# Patient Record
Sex: Male | Born: 1935 | Race: White | Hispanic: No | Marital: Married | Smoking: Former smoker
Health system: Southern US, Community
[De-identification: ages and names within clinical notes are randomized; demographics above are authoritative.]

## PROBLEM LIST (undated history)

## (undated) DIAGNOSIS — E109 Type 1 diabetes mellitus without complications: Secondary | ICD-10-CM

## (undated) DIAGNOSIS — J449 Chronic obstructive pulmonary disease, unspecified: Secondary | ICD-10-CM

## (undated) DIAGNOSIS — R079 Chest pain, unspecified: Secondary | ICD-10-CM

## (undated) DIAGNOSIS — N529 Male erectile dysfunction, unspecified: Secondary | ICD-10-CM

## (undated) DIAGNOSIS — E119 Type 2 diabetes mellitus without complications: Secondary | ICD-10-CM

## (undated) DIAGNOSIS — Z9981 Dependence on supplemental oxygen: Secondary | ICD-10-CM

## (undated) DIAGNOSIS — E7800 Pure hypercholesterolemia, unspecified: Secondary | ICD-10-CM

## (undated) DIAGNOSIS — C189 Malignant neoplasm of colon, unspecified: Secondary | ICD-10-CM

## (undated) DIAGNOSIS — I10 Essential (primary) hypertension: Secondary | ICD-10-CM

## (undated) DIAGNOSIS — I25810 Atherosclerosis of coronary artery bypass graft(s) without angina pectoris: Secondary | ICD-10-CM

## (undated) DIAGNOSIS — J189 Pneumonia, unspecified organism: Secondary | ICD-10-CM

## (undated) DIAGNOSIS — M1990 Unspecified osteoarthritis, unspecified site: Secondary | ICD-10-CM

## (undated) DIAGNOSIS — I209 Angina pectoris, unspecified: Secondary | ICD-10-CM

## (undated) DIAGNOSIS — Z9289 Personal history of other medical treatment: Secondary | ICD-10-CM

## (undated) DIAGNOSIS — R0602 Shortness of breath: Secondary | ICD-10-CM

## (undated) DIAGNOSIS — Z8719 Personal history of other diseases of the digestive system: Secondary | ICD-10-CM

## (undated) DIAGNOSIS — N184 Chronic kidney disease, stage 4 (severe): Secondary | ICD-10-CM

## (undated) HISTORY — DX: Type 1 diabetes mellitus without complications: E10.9

## (undated) HISTORY — DX: Male erectile dysfunction, unspecified: N52.9

---
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
## 1999-03-17 ENCOUNTER — Ambulatory Visit (HOSPITAL_COMMUNITY): Admission: RE | Admit: 1999-03-17 | Discharge: 1999-03-17 | Payer: Self-pay | Admitting: Gastroenterology

## 1999-03-17 ENCOUNTER — Inpatient Hospital Stay (HOSPITAL_COMMUNITY): Admission: RE | Admit: 1999-03-17 | Discharge: 1999-03-24 | Payer: Self-pay | Admitting: Surgery

## 1999-03-18 ENCOUNTER — Encounter: Payer: Self-pay | Admitting: Surgery

## 1999-07-11 HISTORY — PX: COLECTOMY: SHX59

## 1999-07-11 HISTORY — PX: CORONARY ARTERY BYPASS GRAFT: SHX141

## 1999-07-11 HISTORY — PX: INGUINAL HERNIA REPAIR: SUR1180

## 1999-07-11 HISTORY — PX: CATARACT EXTRACTION W/ INTRAOCULAR LENS  IMPLANT, BILATERAL: SHX1307

## 2000-07-14 ENCOUNTER — Ambulatory Visit (HOSPITAL_COMMUNITY): Admission: RE | Admit: 2000-07-14 | Discharge: 2000-07-14 | Payer: Self-pay | Admitting: Gastroenterology

## 2000-07-14 ENCOUNTER — Encounter (INDEPENDENT_AMBULATORY_CARE_PROVIDER_SITE_OTHER): Payer: Self-pay | Admitting: Specialist

## 2002-06-01 ENCOUNTER — Ambulatory Visit (HOSPITAL_BASED_OUTPATIENT_CLINIC_OR_DEPARTMENT_OTHER): Admission: RE | Admit: 2002-06-01 | Discharge: 2002-06-01 | Payer: Self-pay | Admitting: Urology

## 2002-06-01 ENCOUNTER — Encounter: Payer: Self-pay | Admitting: Urology

## 2002-08-28 ENCOUNTER — Ambulatory Visit (HOSPITAL_COMMUNITY): Admission: RE | Admit: 2002-08-28 | Discharge: 2002-08-28 | Payer: Self-pay | Admitting: Gastroenterology

## 2002-11-13 ENCOUNTER — Observation Stay (HOSPITAL_COMMUNITY): Admission: RE | Admit: 2002-11-13 | Discharge: 2002-11-14 | Payer: Self-pay | Admitting: Surgery

## 2003-11-29 ENCOUNTER — Encounter: Admission: RE | Admit: 2003-11-29 | Discharge: 2003-11-29 | Payer: Self-pay | Admitting: Family Medicine

## 2003-12-24 ENCOUNTER — Encounter (INDEPENDENT_AMBULATORY_CARE_PROVIDER_SITE_OTHER): Payer: Self-pay | Admitting: Specialist

## 2003-12-24 ENCOUNTER — Ambulatory Visit (HOSPITAL_COMMUNITY): Admission: RE | Admit: 2003-12-24 | Discharge: 2003-12-24 | Payer: Self-pay | Admitting: Gastroenterology

## 2004-06-11 ENCOUNTER — Emergency Department (HOSPITAL_COMMUNITY): Admission: EM | Admit: 2004-06-11 | Discharge: 2004-06-11 | Payer: Self-pay | Admitting: Emergency Medicine

## 2004-07-01 ENCOUNTER — Inpatient Hospital Stay (HOSPITAL_COMMUNITY): Admission: EM | Admit: 2004-07-01 | Discharge: 2004-07-04 | Payer: Self-pay | Admitting: Emergency Medicine

## 2004-07-03 ENCOUNTER — Encounter (INDEPENDENT_AMBULATORY_CARE_PROVIDER_SITE_OTHER): Payer: Self-pay | Admitting: Interventional Cardiology

## 2005-05-04 ENCOUNTER — Emergency Department (HOSPITAL_COMMUNITY): Admission: EM | Admit: 2005-05-04 | Discharge: 2005-05-04 | Payer: Self-pay | Admitting: Emergency Medicine

## 2005-12-21 ENCOUNTER — Encounter: Admission: RE | Admit: 2005-12-21 | Discharge: 2005-12-21 | Payer: Self-pay | Admitting: Nephrology

## 2006-09-26 ENCOUNTER — Emergency Department (HOSPITAL_COMMUNITY): Admission: EM | Admit: 2006-09-26 | Discharge: 2006-09-27 | Payer: Self-pay | Admitting: Emergency Medicine

## 2008-12-26 ENCOUNTER — Encounter: Admission: RE | Admit: 2008-12-26 | Discharge: 2009-01-17 | Payer: Self-pay | Admitting: Family Medicine

## 2009-03-14 ENCOUNTER — Encounter: Admission: RE | Admit: 2009-03-14 | Discharge: 2009-03-14 | Payer: Self-pay | Admitting: Orthopaedic Surgery

## 2009-10-25 ENCOUNTER — Ambulatory Visit (HOSPITAL_BASED_OUTPATIENT_CLINIC_OR_DEPARTMENT_OTHER): Admission: RE | Admit: 2009-10-25 | Discharge: 2009-10-25 | Payer: Self-pay | Admitting: Urology

## 2010-12-01 ENCOUNTER — Encounter: Payer: Self-pay | Admitting: Orthopaedic Surgery

## 2011-02-09 LAB — POCT I-STAT 4, (NA,K, GLUC, HGB,HCT)
Glucose, Bld: 151 mg/dL — ABNORMAL HIGH (ref 70–99)
Hemoglobin: 14.3 g/dL (ref 13.0–17.0)
Sodium: 141 mEq/L (ref 135–145)

## 2011-02-09 LAB — GLUCOSE, CAPILLARY: Glucose-Capillary: 208 mg/dL — ABNORMAL HIGH (ref 70–99)

## 2011-03-27 NOTE — Op Note (Signed)
NAME:  Raymond [name], Raymond [name] NO.:  [id number]   MEDICAL RECORD NO.:  [id number]                  PATIENT TYPE:  ENDO   LOCATION:                                       FACILITY:  [facility]   PHYSICIAN:  James L. Malon Kindle., M.D.          DATE OF BIRTH:  12/12/1929   DATE OF PROCEDURE:  12/24/2003  DATE OF DISCHARGE:                                 OPERATIVE REPORT   PROCEDURE:  Colonoscopy.   MEDICATIONS:  No additional medicines.  Received 62.5 mg fentanyl  and 6 mg  of Versed for the endoscopy.   INDICATIONS:  Iron-deficiency anemia.   DESCRIPTION OF PROCEDURE:  Following the endoscopy, the patient was turned  around and the Olympus pediatric video adjustable scope was inserted and  advanced.  We advanced to the cecum.  The colon was a bit dirty.  There were  some areas obscured by sticky, adherent stool.  The ileocecal valve was  clearly identified.  The scope was withdrawn and the cecum, ascending colon,  transverse colon, splenic flexure, descending, and sigmoid colon were  grossly normal.  A small polyp could have been missed.  There were no large  polyps, tumor masses, minimal diverticular disease.  The scope was  withdrawn. The patient tolerated the procedure well.   ASSESSMENT:  Iron-deficiency anemia.  280.0.   PLAN:  Will treat the patient's ulcers with Aciphex and see back in the  office in two months.                                               James L. Malon Kindle., M.D.    Waldron Session  D:  12/24/2003  T:  12/24/2003  Job:  [id number]   cc:   Chales Salmon. Abigail Miyamoto, M.D.  [address]  [place]  [place] [address]  Fax: [phone]

## 2011-03-27 NOTE — Op Note (Signed)
NAME:  Raymond [name], Raymond [name]                      ACCOUNT NO.:  [id number]   MEDICAL RECORD NO.:  [id number]                   PATIENT TYPE:  AMB   LOCATION:  ENDO                                 FACILITY:  [facility]   PHYSICIAN:  James L. Malon Kindle., M.D.          DATE OF BIRTH:  22-Jan-1930   DATE OF PROCEDURE:  12/24/2003  DATE OF DISCHARGE:                                 OPERATIVE REPORT   PROCEDURE:  Esophagogastroduodenoscopy and biopsy.   MEDICATIONS:  Cetacaine spray, fentanyl 62.5 mcg, Versed 6 mg IV.   INDICATION:  Iron deficiency anemia, history of chronic pancreatitis.   DESCRIPTION OF PROCEDURE:  The procedure had been explained to the patient  and consent obtained.  With the patient in the left lateral decubitus  position, the scope was inserted and advanced.  The stomach was entered.  The pylorus identified and passed.  There were multiple erosions and small  ulcerations in the duodenum, and several of these extended down into the  second portion.  There was a prominent one in the second portion.  Multiple  biopsies were taken.  The folds were enlarged and swollen, and these were  biopsied as well.  These were placed all in a single drawer.  The scope was  withdrawn.  The majority of the bulb, and particularly the proximal part was  normal.  This was the distal bulb and proximal second portion.  The pyloric  channel was normal.  The antrum and body were endoscopically normal.  A  biopsy was taken for rapid urease test for Helicobacter.  The fundus and  cardia were seen well on the retroflexed view and were normal.  The scope  was withdrawn.  The distal and proximal esophagus were endoscopically  normal.  The patient tolerated the procedure well and was maintained on low-  flow oxygen and pulse oximeter throughout the procedure.   ASSESSMENT:  1. Duodenal ulcerations, biopsied (532.70).  The location of these has been     unusual in the distal bulb and proximal second  duodenum, although they     may well be acid peptic-related.  2. Iron deficiency anemia (280.0).   PLAN:  We will go ahead and proceed with a colonoscopy at this time.  We  will start the patient empirically on AcipHex.                                               James L. Malon Kindle., M.D.    Waldron Session  D:  12/24/2003  T:  12/24/2003  Job:  [id number]   cc:   Chales Salmon. Abigail Miyamoto, M.D.  [address]  [place]  [place] [address]  Fax: [phone]

## 2011-03-27 NOTE — Op Note (Signed)
   NAME:  Raymond [name], Raymond [name]                      ACCOUNT NO.:  [id number]   MEDICAL RECORD NO.:  [id number]                   PATIENT TYPE:  AMB   LOCATION:  ENDO                                 FACILITY:  [facility]   PHYSICIAN:  James L. Malon Kindle., M.D.          DATE OF BIRTH:  [birth year]   DATE OF PROCEDURE:  08/28/2002  DATE OF DISCHARGE:                                 OPERATIVE REPORT   PROCEDURE:  Colonoscopy.   MEDICATIONS:  Fentanyl 50 mcg and Versed 5 mg IV.   INSTRUMENT USED:  Olympus adult video colonoscope.   INDICATIONS FOR PROCEDURE:  Patient with a previous history of a sigmoid  colon carcinoma resected and subsequently has had several polyps removed.  This was done as a follow-up.   DESCRIPTION OF PROCEDURE:  The procedure had been explained to the patient  and consent obtained. With the patient in the left lateral decubitus  position, digital examination was performed and the adult scope is inserted  and advanced.  The patient had an adequate prep. There were some areas that  had some sticky, adherent stool, but using abdominal pressure and position  change, we were able to advance to the cecum. The ileocecal valve was  identified, the right lower quadrant was transilluminated. The scope was  withdrawn. The cecum, ascending colon, hepatic flexure, transverse colon,  splenic flexure, descending, and sigmoid colon were seen well upon removal  and no polyps seen.  The anastomosis was seen well in the lower sigmoid with  no evidence of recurrent cancer.  The rectum was free of polyps. The scope  was withdrawn. The patient tolerated the procedure well and was maintained  on low flow oxygen and pulse oximetry throughout the procedure.   ASSESSMENT:  No evidence of further polyps in this gentleman with a previous  history of sigmoid colon cancer.   PLAN:  Will recommend repeating colonoscopy in three years and will  recommendation for yearly hemoccults.                                      James L. Malon Kindle., M.D.    Waldron Session  D:  08/28/2002  T:  08/28/2002  Job:  [id number]   cc:   Chales Salmon. Abigail Miyamoto, M.D.   [place] Daphine Deutscher, M.D.  Fax: [phone]

## 2011-03-27 NOTE — H&P (Signed)
NAME:  Raymond [name], Raymond [name]                      ACCOUNT NO.:  [id number]   MEDICAL RECORD NO.:  [id number]                   PATIENT TYPE:  INP   LOCATION:  1824                                 FACILITY:  [facility]   PHYSICIAN:  Deirdre Peer. Polite, M.D.              DATE OF BIRTH:  1930-05-30   DATE OF ADMISSION:  06/30/2004  DATE OF DISCHARGE:                                HISTORY & PHYSICAL   PRIMARY MEDICAL DOCTOR:  Dr. Chales Salmon. Thacker.   CHIEF COMPLAINT:  Loss of consciousness x4 hours.   HISTORY OF PRESENT ILLNESS:  Raymond [name] is an elderly male with multiple  medical problems which include diabetes, hypertension, coronary artery  disease, COPD, colon CA.  The patient presents to the ED with complaints of  syncope.  The patient stated he was in his usual state of health until  approximately 11 o'clock this morning, where he usually took his medicine  and ate breakfast.  Upon walking out of his office, the patient stated that  he just fell out.  He states that he woke up approximately 4-5 hours ago on  the floor.  When his wife came home, informed her of this and later  presented to the ED.  The patient denied any chest pain, denied any  shortness of breath, denied any nausea or vomiting, denied any fever or  chills, denied any change in stool or change in urine.  The patient stated  that he took his normal dose of medications, which he takes essentially all  of his medicines in the morning.  The patient states that he has not had any  symptoms like this before; however, the patient did present to the ED  approximately 2 weeks ago with a confusional state presumed secondary to  hypoglycemia.  In the ED today, the patient was evaluated.  BP lying was  192/106, sitting -- 160/98; no other orthostatics were checked.  The patient  had an EKG within normal limits.  The patient had screening labs which  showed a creatinine of 2.8, CBC within normal limits and troponin I of 0.09,  CK  total 1626 with an MB of 11 and index of 0.7.  [facility] hospitalist was  called for further evaluation of this patient and possible admission.  At  the time of my arrival, the patient was alert and oriented x3, in no  apparent distress, again denied any chest pain, shortness of breath or  palpitations.  The patient states that he just was sitting in his office and  upon getting up and walking in the room, the patient states that he fell out  and thinks that he woke up 4 hours later on the floor.  Admission is deemed  necessary for further evaluation and treatment.   PAST MEDICAL HISTORY:  Past medical history is significant for:  1. Diabetes x15 years.  2. Hypertension.  3. History of MI in 1997, currently status  post CABG.  4. COPD.  5. Colon CA diagnosed 4 years, treated with colectomy by Dr. Molli Hazard B.     Daphine Deutscher.  6. Calcified pancreas.   MEDICATIONS:  Medications include:  1. Metoprolol 50 mg daily.  2. Lotensin 40 mg daily.  3. Doxazosin 8 mg daily.  4. Insulin N and insulin R via sliding scale.  5. Aspirin 81 mg daily.  6. Multivitamin daily.  7. Ferrous sulfate 325 mg b.i.d.  8. Lipram-CR20 two prior to meals.  9. Aciphex 20 mg daily.   SOCIAL HISTORY:  1. Social history is negative for tobacco since 1984; prior to that, the     patient smoked 4 packs a day for 30 years.  2. No alcohol since 1984.  3. Denies any drugs.   PAST SURGICAL HISTORY:  Past surgical history is significant for:  1. Appendectomy at age 4.  2. CABG in 1997.  3. Status post colectomy approximately 4 years ago.  4. Denies any cholecystectomy.   ALLERGIES:  He described allergy to CODEINE.   FAMILY HISTORY:  Father is deceased secondary to congestive heart failure;  mother deceased, history of congestive heart failure; he has 1 brother  recovering from prostate CA.   REVIEW OF SYSTEMS:  Review of systems as stated in the HPI.   PHYSICAL EXAM:  GENERAL:  In general, the patient is alert and  oriented x3,  in no apparent distress.  VITALS:  BP 160/98, pulse 105, respiratory rate of 20.  HEENT:  Within normal limits.  CHEST:  Decreased breath sounds in base, no rales, no crackles.  CARDIOVASCULAR:  Regular.  No S3.  ABDOMEN:  Abdomen soft and nontender.  EXTREMITIES:  No edema.  NEUROLOGIC:  Exam nonfocal.   DATA:  CT of the head:  Chronic small vessel ischemic changes, no acute  intracranial findings.   Chest x-ray without acute infiltrate, signs of COPD/emphysema.   EKG:  Sinus rhythm, borderline first-degree A-V block, no acute ST changes.   CBC:  White count 10.5, hemoglobin 11.5, platelets 276,000; neutrophil count  76%.  BMET:  Creatinine 2.8, AST and ALT 50 and 17, respectively, bilirubin  0.6.  Troponin I 0.09, CK 1626, CK-MB 11.1, index 0.7.  UA:  Specific  gravity 1.020, LE and nitrite negative, urine white blood cells 0-2, a few  bacteria.  Glucose 159;  please note, the patient's glucose at home after  reported event approximately 57, per patient.   ASSESSMENT:  1. Syncope with reportedly being unconscious for approximately 4 hours.     Differential diagnosis includes arrhythmia, orthostasis versus     hypoglycemia, versus medications, versus pulmonary embolus, however,     please note, the patient is not hypoxic.  2. Elevated CK consistent with rhabdomyolysis.  3. Coronary artery disease, status post coronary artery bypass graft.  4. Hypertension.  5. Diabetes.  6. Colon carcinoma.  7. Chronic obstructive pulmonary disease.  8. Renal insufficiency with creatinine of 2.8, questionable baseline.   RECOMMENDATION:  Recommend patient being admitted to a monitored floor bed,  obtain serial cardiac enzymes, check a carotid Doppler to consider cardiac  evaluation for potential leak or arrhythmia as a cause for syncope.  We will  also obtain orthostatics and hold the doxazosin at this time.  I also will hold ACE inhibitor until obtaining the patient's baseline  renal failure.  With reported events of hypoglycemia today and 2 weeks ago, we will also  obtain a hemoglobin A1c to see what patient's  underlying control has been at  home; as the patient has some renal dysfunction, it could be that his  insulin requirement has decreased.  Also, with history of COPD and  significant smoking history, we will obtain an ABG.  We will make further  recommendations after review of the above studies.                                                Deirdre Peer. Polite, M.D.    RDP/MEDQ  D:  07/01/2004  T:  07/01/2004  Job:  [id number]   cc:   Chales Salmon. Abigail Miyamoto, M.D.  [address]  [place]  [place] [address]  Fax: [phone]

## 2011-03-27 NOTE — Procedures (Signed)
[facility]  Patient:    Raymond [name], Raymond [name]                   MRN: [id number] Proc. Date: 07/14/00 Adm. Date:  [id number] Attending:  Orland Mustard CC:         Chales Salmon. Abigail Miyamoto, M.D.  [place] Daphine Deutscher, M.D.   Procedure Report  PROCEDURE:  Colonoscopy with polypectomy and biopsy.  SURGEON:  James L. Edwards, M.D.  MEDICATIONS:  Fentanyl 75 mcg and Versed 6 mg IV.  SCOPE:  Adult Olympus video colonoscope.  INDICATIONS:  A nice 77 year old gentleman who one year ago had adenocarcinoma of the sigmoid colon.  It was a Dukes B-1.  It was resected by Dr. Daphine Deutscher. This is done as a followup.  DESCRIPTION OF PROCEDURE:  The procedure had been explained to the patient and consent obtained.  With the patient in the left lateral decubitus position, the Olympus adult video colonoscope was inserted and advanced under direct visualization.  The prep was quite good.  We were able to advance to the cecum without difficulty, and the ileocecal valve was seen and the appendiceal orifice was seen.  The scope was withdrawn.  The cecum, ascending colon, hepatic flexure, transverse colon, splenic flexure, and descending colon were seen well.  In the mid descending colon, a three-quarter centimeter polyp was seen and was removed with the snare and sucked through the scope, and what was left of the sigmoid colon at the anastomosis was polypoid lesion seen around the anastomosis.  It was biopsied.  It was felt this was probably granulation tissue.  There is no obvious recurrent cancer and no other polyps seen.  The scope was withdrawn.  The patient tolerated the procedure well and was maintained on low flow oxygen and pulse oximeter throughout the procedure with no obvious problem.  ASSESSMENT: 1. Descending colon polyps, removed. 2. Probable granulation tissue around the anastomosis.  PLAN:  Will check pathology and recommend repeating in two years. DD:   07/14/00 TD:  07/14/00 Job: [id number]

## 2011-03-27 NOTE — Op Note (Signed)
NAME:  Raymond [name], Raymond [name]                      ACCOUNT NO.:  [id number]   MEDICAL RECORD NO.:  [id number]                   PATIENT TYPE:  AMB   LOCATION:  DAY                                  FACILITY:  [facility]   PHYSICIAN:  [place]. Daphine Deutscher, M.D.             DATE OF BIRTH:  Jul 16, 1930   DATE OF PROCEDURE:  11/13/2002  DATE OF DISCHARGE:                                 OPERATIVE REPORT   PREOPERATIVE DIAGNOSIS:  Bilateral inguinal hernias.   POSTOPERATIVE DIAGNOSIS:  Bilateral direct inguinal hernias.   PROCEDURE:  Open repair of bilateral inguinal hernias with mesh.   SURGEON:  [place]. Daphine Deutscher, M.D.   DESCRIPTION OF PROCEDURE:  The patient is a 77 year old gentleman on whom I  did a colon resection for colon cancer in mid-2000.  He has developed  bilateral inguinal hernias that have been bothering him.  Informed consent  was obtained regarding repair.   The patient was taken to room 10 at [facility] and given general  anesthesia.  He received a gram of Ancef preop.  The abdomen was prepped  with Betadine and draped sterilely, and a Foley was inserted.   First I worked on the patient's left side, which was the larger side.  An  oblique incision was made and carried down through the subcutaneous tissue.  I exposed the external oblique fascia and incised the fibers along the  direction of them.  I opened into the external ring.  I mobilized the cord  and did not see any evidence of an indirect hernia.  He had a very large  direct hernia.  I freed this up and then reduced this using an [employer] to  hold it inside.  I cut a piece of Marlex mesh to fit, sutured along the  inguinal ligament with a running 2-0 Prolene.  Superiorly and medially I  sutured it again to the fascia and then sutured it to itself around the cord  structures.  This completely covered the floor.  This was tucked beneath the  external oblique, and the external oblique was closed with a running 2-0  Vicryl.  Vicryl 4-0 was used on the subcutaneous tissue and the skin was  closed with 4-0 Vicryl subcutaneously as well as staples.   Next I moved to the patient's right side and did essentially a mirror image  incision and I again exposed this just as well.  The fibers were incised and  the cord was mobilized.  Again a large direct hernia was found.  It seemed  to go a little farther out toward the external ring.  I reduced this and  then cut a piece of mesh to fit and then sutured along the inguinal ligament  with running 2-0 Prolene and again, as was done on the left side, the flaps  of mesh were laid over each other and were approximated with 2-0 Prolene  horizontal mattress suture.  The  mesh was tucked beneath the external  oblique, which was then closed in a running 2-0 Vicryl.  The patient's wound  was closed.  The patient  was awakened and taken to the recovery room in satisfactory condition.  Because of his diabetes, he will be kept for overnight observation.   FINAL DIAGNOSIS:  Bilateral direct inguinal hernias, status post repair with  mesh.                                               [place] Daphine Deutscher, M.D.    MBM/MEDQ  D:  11/13/2002  T:  11/13/2002  Job:  [id number]   cc:   Chales Salmon. Abigail Miyamoto, M.D.  [address]  [place]  [place] [address]  Fax: [phone]

## 2011-03-27 NOTE — Consult Note (Signed)
NAME:  Raymond [name], Raymond [name]                      ACCOUNT NO.:  [id number]   MEDICAL RECORD NO.:  [id number]                   PATIENT TYPE:  INP   LOCATION:  3705                                 FACILITY:  [facility]   PHYSICIAN:  Meade Maw, M.D.                 DATE OF BIRTH:  1930-04-21   DATE OF CONSULTATION:  07/02/2004  DATE OF DISCHARGE:                                   CONSULTATION   PRIMARY CARE PHYSICIAN:  Raymond [name], M.D.   REASON FOR CONSULTATION:  Syncope.   HISTORY OF PRESENT ILLNESS:  Raymond [name] is a pleasant 77 year old  gentleman who presented to the hospital on June 30, 2004 with syncopal  episode.  The patient said that he was in his usual state of health until  approximately 11:30 a.m.  He took his usual medication, ate breakfast.  Out  of walking out of his office, he states he was getting ready to go to lunch,  which was approximately 11 a.m.  Approximately four to five hours later, he  awakened and found himself laying on the floor.  When his wife came home, he  informed her of these events and she recommended that he proceed to the  emergency room for further evaluation.   He has had no chest pain.  His activity has recently been limited by  dyspnea.  He has had no palpitations.  No pedal edema.  No orthopnea.  He  did have an emergency room visit two weeks prior to this presentation for  confusional state.  This was presumed to be secondary to hypoglycemia.  Upon  arrival to the emergency room, his blood pressure was noted to be 192/106.  His ECG had no changes for ischemia.   PAST MEDICAL HISTORY:  1. Significant for coronary artery disease, status post CABG in 1997.  2. Hypertension.  3. Insulin-dependent diabetes mellitus.  4. COPD.  5. Colon cancer.  6. History of calcified pancreas.   MEDICATIONS:  1. Metoprolol 50 mg q.d.  2. Lotensin 40 mg q.d.  3. Cardura 8 mg q.d.  4. Insulin.  5. Aspirin 81 mg q.d.  6. Multivitamin.  7.  Ferrous sulfate 325 mg b.i.d.  8. Aciphex 20 mg q.d.   CURRENT MEDICATIONS:  1. Lopressor 50 mg p.o. q.d.  2. Aspirin 81 mg q.d.  3. Ferrous sulfate 325 mg q.d.  4. Amylase, lipase, Protease  2 capsule t.i.d. before meals.  5. Protonix 40 mg q.d.  6. Cardura 4 mg q.d.  7. Phenergan p.r.n.  8. Tylenol p.r.n.   ALLERGIES:  CODEINE.   FAMILY HISTORY:  Father passed from congestive heart failure.  Mother  questionably passed from congestive heart failure.  He has one brother who  is recovering from prostate cancer.   SOCIAL HISTORY:  He is married.  No history of tobacco or alcohol use since  1984.  Continues to work at [employer].  Sedentary job.  He is not  involved in a regular exercise program.   REVIEW OF SYMPTOMS:  As noted in the HPI.  Review of systems otherwise  negative.   PHYSICAL EXAMINATION:  GENERAL:  An elderly male in no acute distress.  VITAL SIGNS:  Blood pressure 161/87, heart rate 72, respiratory rate 20.  He  is afebrile.  O2 saturation is 96% on room air.  NECK:  Reveals presence of a soft right carotid bruit.  There is no neck  vein distention noted.  LUNGS:  Breath sounds which are equal and clear to auscultation.  There are  no accessory muscles noted.  CARDIOVASCULAR:  Regular rate and rhythm.  Normal S1, normal S2.  There is a  2/6 systolic murmur noted at the left upper sternal border.  ABDOMEN:  Benign, soft and nontender.  EXTREMITIES:  No peripheral edema.  The distal pulses are equal and  palpable.  NEUROLOGICAL:  Nonfocal.   LABORATORY DATA:  CT scan of the head revealed chronic small vessel ischemic  changes.  Chest x-ray revealed signs of COPD.  ECG revealed sinus rhythm,  borderline first degree AV block.  No acute ST changes noted.  Carotid  duplex was obtained and has revealed a moderate right calcified plaque in  the external carotid artery.   His pH was 7.41, pCO2 is 40, pO2 of 72, O2 saturation is 94%.  White count  is 10.5,  hemoglobin 11.5, hematocrit 34.0, platelet count 276,000.  Sodium  139, potassium 4.2, creatinine is 2.8.  His initial CK was 1984 with a MB of  10.7.  His relative index is 0.5.  Troponin I has been normal.  TSH is  0.491.   IMPRESSION:  57. A 77 year old gentleman with known coronary artery disease who presents     with a history of four hours of syncope without prodrome symptoms.  He     has had no arrhythmias while he has been in the hospital.  He has had no     angina which was his presenting symptoms prior to coronary artery bypass     graft.  His activity, however, has been limited by shortness of breath.     We will proceed with a stress Cardiolite.  The patient will be in a high     risk for catheterization secondary to his renal insufficiency.  2. Hypertension:  Marginal control.  Would increase his Lopressor to 50 mg     p.o. b.i.d.  3. Acute renal insufficiency:  His medication is currently on hold.     Elevated CK is probably a combination of his prolonged period on the     floor as well as his renal insufficiency.  I will inform Dr. Lyn Records of the patient's admission, further recommendations, and plans per     Dr. Lyn Records.                                               Meade Maw, M.D.    HP/MEDQ  D:  07/02/2004  T:  07/02/2004  Job:  [id number]

## 2011-03-27 NOTE — Discharge Summary (Signed)
NAME:  Raymond [name], Raymond [name] NO.:  [id number]   MEDICAL RECORD NO.:  [id number]          PATIENT TYPE:  INP   LOCATION:  3705                         FACILITY:  [facility]   PHYSICIAN:  Jackie Plum, M.D.DATE OF BIRTH:  01/17/1930   DATE OF ADMISSION:  07/01/2004  DATE OF DISCHARGE:  07/04/2004                                 DISCHARGE SUMMARY   DISCHARGE DIAGNOSES:  1.  Syncopal episodes presumed secondary to hypoglycemia.  2.  Rhabdomyolysis, resolving.  3.  History of renal insufficiency.  4.  Diabetes.  5.  Hypertension.  6.  Coronary artery disease, status post coronary artery bypass graft.   DISCHARGE MEDICATIONS:  The patient was discharged home with instructions to  resume his previous home medications, except to hold his NPH insulin.  He  was instructed to document all his blood sugar 30 minutes before all meals  and take those results to his doctor to his first office appointment.  He  was asked to schedule a followup appointment to see his primary care  physician in 7-10 days' time.  He was offered a sliding-scale insulin with  Regular insulin administration prior to his meals.   CONSULTANT:  Dr. Lyn Records of cardiology.   PROCEDURES:  Stress Cardiolite done on date of admission which came out to  be negative.   PROCEDURE:  Two-dimensional echocardiogram indicated an ejection fraction of  40% to 50% with probable inferior hypokinesis.   REASON FOR HOSPITALIZATION:  History of syncopal episode.   Please see admission H&P dictated by Dr. Deirdre Peer. Polite for full details  regarding the patient's presentation.  The patient was admitted to the  hospital after presenting to the ER with syncope.  The patient had been seen  2 weeks prior to presentation with a confusional state secondary to  hypoglycemia.  This episode of syncope was not precipitated by any  significant cardiopulmonary symptomatology.  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  He was admitted for further evaluation of  his syncopal episodes.   HOSPITAL COURSE:  The patient was admitted to the hospitalists' service.  Serial cardiac enzymes were obtained which ruled out myocardial infarction.  Carotid Doppler was done which was negative and subsequently had a 2-D  echocardiogram which revealed possible inferior hypokinesis with EF of 40%  to 50% as stated above.  The patient's episode was presumably related to  hypoglycemia, however, the patient seemed very educated about diabetes and  __________ very much his hypoglycemic symptoms.  He had also indicated that  he has had some dyspnea on exertion which is alleviated by resting, without  any chest pain.  On account of these with his history of diabetes mellitus  and the small systolic dysfunction on 2-D echo as well as probable  hypokinesis, we were concerned about possible silent ischemia.  I consulted  with Dr. Verdis Prime of cardiology.  Though the patient's CBG after  awakening from his episode was hypoglycemic at 56 mg/dl, he  agreed that a  stress test was appropriate for this patient.  The patient had a stress test  which was negative, as noted above, and it was decided not to pursue any  further cardiac workup after further discussions with the cardiologist and  the patient's family.  The cause of the patient's syncopal episode was  presumed secondary to hypoglycemia.  It was noted that the patient had  presented 2 weeks prior to presentation with confusion secondary to  hypoglycemia.  We decided to hold his NPH and continue his sliding-scale  insulin, check his glucose regularly and to be seen in the office by his  PCP, at which time the need for adjustment in his diabetes medication will  be addressed.  The patient is discharged home in stable and satisfactory  condition.      Geor   GO/MEDQ  D:   08/25/2004  T:  08/25/2004  Job:  [id number]

## 2011-03-27 NOTE — Procedures (Signed)
REQUESTING PHYSICIAN:  Jackie Plum, M.D.   CLINICAL HISTORY:  A 77 year old male with recurrent bouts of syncope.  EEG  is performed for evaluation.  This was a routine EEG done with photic, but  not hyperventilation.   DESCRIPTION:  The dominant rhythm of this tracing is a low to moderate  amplitude alpha rhythm of 10 Hz which predominates posteriorly, appears  without abnormal asymmetry and attenuates with eye opening and closing.  Low  amplitude fast activity is seen frontally and centrally and appears without  abnormal asymmetry.  Intermittently a high amplitude 7-8 Hz theta rhythm  emanates from the right temporal area.  No epileptiform discharge are seen.  The patient remained in the latent state throughout the recording.  Photic  stimulation produced a strong driving response at 9 Hz.  Hyperventilation  was not performed.  A single channel devoted to EKG revealed a normal sinus  rhythm throughout with a rate of approximately 66 beats per minute.   CONCLUSIONS:  Abnormal study due to the presence of intermittent slowing of  the rhythms noted in the right temporal area.  Findings suggestive of  underlying focal pathology.  No epileptiform discharges are seen.    Fenton Malling, M.D.   EAV:[id number]  D:  07/03/2004 18:06:06  T:  07/04/2004 16:00:12  Job #:  [id number]

## 2011-11-10 ENCOUNTER — Inpatient Hospital Stay (HOSPITAL_COMMUNITY)
Admission: EM | Admit: 2011-11-10 | Discharge: 2011-11-12 | DRG: 313 | Disposition: A | Discharge status: Home / Self Care | Payer: Managed Care, Other (non HMO) | Attending: Internal Medicine | Admitting: Internal Medicine

## 2011-11-10 ENCOUNTER — Emergency Department (HOSPITAL_COMMUNITY): Payer: Managed Care, Other (non HMO)

## 2011-11-10 ENCOUNTER — Other Ambulatory Visit: Payer: Self-pay

## 2011-11-10 ENCOUNTER — Encounter: Payer: Self-pay | Admitting: *Deleted

## 2011-11-10 DIAGNOSIS — R079 Chest pain, unspecified: Principal | ICD-10-CM | POA: Diagnosis present

## 2011-11-10 DIAGNOSIS — I2510 Atherosclerotic heart disease of native coronary artery without angina pectoris: Secondary | ICD-10-CM | POA: Diagnosis present

## 2011-11-10 DIAGNOSIS — E119 Type 2 diabetes mellitus without complications: Secondary | ICD-10-CM | POA: Diagnosis present

## 2011-11-10 DIAGNOSIS — Z794 Long term (current) use of insulin: Secondary | ICD-10-CM

## 2011-11-10 DIAGNOSIS — J4489 Other specified chronic obstructive pulmonary disease: Secondary | ICD-10-CM | POA: Diagnosis present

## 2011-11-10 DIAGNOSIS — N185 Chronic kidney disease, stage 5: Secondary | ICD-10-CM | POA: Diagnosis present

## 2011-11-10 DIAGNOSIS — E876 Hypokalemia: Secondary | ICD-10-CM | POA: Diagnosis not present

## 2011-11-10 DIAGNOSIS — Z951 Presence of aortocoronary bypass graft: Secondary | ICD-10-CM

## 2011-11-10 DIAGNOSIS — Z7982 Long term (current) use of aspirin: Secondary | ICD-10-CM

## 2011-11-10 DIAGNOSIS — J449 Chronic obstructive pulmonary disease, unspecified: Secondary | ICD-10-CM | POA: Diagnosis present

## 2011-11-10 DIAGNOSIS — I120 Hypertensive chronic kidney disease with stage 5 chronic kidney disease or end stage renal disease: Secondary | ICD-10-CM | POA: Diagnosis present

## 2011-11-10 DIAGNOSIS — R262 Difficulty in walking, not elsewhere classified: Secondary | ICD-10-CM | POA: Diagnosis present

## 2011-11-10 HISTORY — DX: Atherosclerosis of coronary artery bypass graft(s) without angina pectoris: I25.810

## 2011-11-10 HISTORY — DX: Chronic obstructive pulmonary disease, unspecified: J44.9

## 2011-11-10 HISTORY — DX: Essential (primary) hypertension: I10

## 2011-11-10 LAB — CBC
HCT: 37.9 % — ABNORMAL LOW (ref 39.0–52.0)
HCT: 39.1 % (ref 39.0–52.0)
MCH: 31.5 pg (ref 26.0–34.0)
MCHC: 32.7 g/dL (ref 30.0–36.0)
MCHC: 33.2 g/dL (ref 30.0–36.0)
MCV: 94.7 fL (ref 78.0–100.0)
Platelets: 269 10*3/uL (ref 150–400)
Platelets: 289 10*3/uL (ref 150–400)
RDW: 13.7 % (ref 11.5–15.5)
RDW: 13.6 % (ref 11.5–15.5)

## 2011-11-10 LAB — POCT I-STAT, CHEM 8
BUN: 56 mg/dL — ABNORMAL HIGH (ref 6–23)
Hemoglobin: 12.9 g/dL — ABNORMAL LOW (ref 13.0–17.0)
Potassium: 3.4 mEq/L — ABNORMAL LOW (ref 3.5–5.1)
Sodium: 141 mEq/L (ref 135–145)
TCO2: 27 mmol/L (ref 0–100)

## 2011-11-10 LAB — LIPASE, BLOOD: Lipase: 6 U/L — ABNORMAL LOW (ref 11–59)

## 2011-11-10 LAB — CARDIAC PANEL(CRET KIN+CKTOT+MB+TROPI)
Relative Index: INVALID (ref 0.0–2.5)
Troponin I: 0.45 ng/mL (ref <0.30)

## 2011-11-10 LAB — COMPREHENSIVE METABOLIC PANEL
ALT: 12 U/L (ref 0–53)
AST: 19 U/L (ref 0–37)
Alkaline Phosphatase: 62 U/L (ref 39–117)
BUN: 58 mg/dL — ABNORMAL HIGH (ref 6–23)
CO2: 27 mEq/L (ref 19–32)
Chloride: 100 mEq/L (ref 96–112)
Creatinine, Ser: 3.97 mg/dL — ABNORMAL HIGH (ref 0.50–1.35)
GFR calc Af Amer: 15 mL/min — ABNORMAL LOW (ref >90)
Sodium: 139 mEq/L (ref 135–145)

## 2011-11-10 LAB — DIFFERENTIAL
Basophils Absolute: 0 10*3/uL (ref 0.0–0.1)
Basophils Relative: 0 % (ref 0–1)
Eosinophils Absolute: 0 10*3/uL (ref 0.0–0.7)
Eosinophils Relative: 0 % (ref 0–5)
Monocytes Absolute: 0.3 10*3/uL (ref 0.1–1.0)

## 2011-11-10 LAB — GLUCOSE, CAPILLARY: Glucose-Capillary: 329 mg/dL — ABNORMAL HIGH (ref 70–99)

## 2011-11-10 LAB — D-DIMER, QUANTITATIVE: D-Dimer, Quant: 1.32 ug/mL-FEU — ABNORMAL HIGH (ref 0.00–0.48)

## 2011-11-10 LAB — PRO B NATRIURETIC PEPTIDE: Pro B Natriuretic peptide (BNP): 248.1 pg/mL (ref 0–450)

## 2011-11-10 MED ORDER — PARICALCITOL 1 MCG PO CAPS
1.0000 ug | ORAL_CAPSULE | Freq: Every day | ORAL | Status: DC
  Administered 2011-11-10 – 2011-11-12 (×3): 1 ug via ORAL
  Filled 2011-11-10 (×3): qty 1

## 2011-11-10 MED ORDER — DEXTROSE 5 % IV SOLN
500.0000 mg | INTRAVENOUS | Status: DC
  Administered 2011-11-10 – 2011-11-12 (×3): 500 mg via INTRAVENOUS
  Filled 2011-11-10 (×3): qty 500

## 2011-11-10 MED ORDER — PANTOPRAZOLE SODIUM 40 MG PO TBEC
40.0000 mg | DELAYED_RELEASE_TABLET | Freq: Every day | ORAL | Status: DC
  Administered 2011-11-10 – 2011-11-12 (×3): 40 mg via ORAL
  Filled 2011-11-10 (×3): qty 1

## 2011-11-10 MED ORDER — THERA M PLUS PO TABS
1.0000 | ORAL_TABLET | Freq: Every day | ORAL | Status: DC
  Administered 2011-11-10 – 2011-11-12 (×3): 1 via ORAL
  Filled 2011-11-10 (×3): qty 1

## 2011-11-10 MED ORDER — ENOXAPARIN SODIUM 40 MG/0.4ML ~~LOC~~ SOLN
40.0000 mg | SUBCUTANEOUS | Status: DC
  Administered 2011-11-10: 40 mg via SUBCUTANEOUS
  Filled 2011-11-10 (×2): qty 0.4

## 2011-11-10 MED ORDER — TIOTROPIUM BROMIDE MONOHYDRATE 18 MCG IN CAPS
18.0000 ug | ORAL_CAPSULE | Freq: Every day | RESPIRATORY_TRACT | Status: DC
  Administered 2011-11-10 – 2011-11-12 (×3): 18 ug via RESPIRATORY_TRACT
  Filled 2011-11-10: qty 5

## 2011-11-10 MED ORDER — QUININE SULFATE 324 MG PO CAPS
648.0000 mg | ORAL_CAPSULE | Freq: Every evening | ORAL | Status: DC
  Administered 2011-11-10 – 2011-11-11 (×2): 648 mg via ORAL
  Filled 2011-11-10 (×3): qty 2

## 2011-11-10 MED ORDER — DOXAZOSIN MESYLATE 8 MG PO TABS
8.0000 mg | ORAL_TABLET | Freq: Every day | ORAL | Status: DC
  Administered 2011-11-10 – 2011-11-11 (×2): 8 mg via ORAL
  Filled 2011-11-10 (×3): qty 1

## 2011-11-10 MED ORDER — POTASSIUM CHLORIDE CRYS ER 10 MEQ PO TBCR
10.0000 meq | EXTENDED_RELEASE_TABLET | Freq: Two times a day (BID) | ORAL | Status: DC
  Administered 2011-11-10 – 2011-11-11 (×4): 10 meq via ORAL
  Administered 2011-11-12: 20 meq via ORAL
  Filled 2011-11-10 (×6): qty 1

## 2011-11-10 MED ORDER — METOPROLOL TARTRATE 50 MG PO TABS
50.0000 mg | ORAL_TABLET | Freq: Two times a day (BID) | ORAL | Status: DC
  Administered 2011-11-10 – 2011-11-12 (×5): 50 mg via ORAL
  Filled 2011-11-10 (×6): qty 1

## 2011-11-10 MED ORDER — MOXIFLOXACIN HCL IN NACL 400 MG/250ML IV SOLN
400.0000 mg | Freq: Once | INTRAVENOUS | Status: AC
  Administered 2011-11-10: 400 mg via INTRAVENOUS
  Filled 2011-11-10: qty 250

## 2011-11-10 MED ORDER — INSULIN NPH (HUMAN) (ISOPHANE) 100 UNIT/ML ~~LOC~~ SUSP
5.0000 [IU] | Freq: Two times a day (BID) | SUBCUTANEOUS | Status: DC
  Filled 2011-11-10: qty 10

## 2011-11-10 MED ORDER — INSULIN ASPART 100 UNIT/ML ~~LOC~~ SOLN
5.0000 [IU] | Freq: Three times a day (TID) | SUBCUTANEOUS | Status: DC | PRN
  Administered 2011-11-10 – 2011-11-11 (×5): 5 [IU] via SUBCUTANEOUS
  Filled 2011-11-10: qty 3

## 2011-11-10 MED ORDER — DEXTROSE 5 % IV SOLN
1.0000 g | INTRAVENOUS | Status: DC
  Administered 2011-11-10 – 2011-11-12 (×3): 1 g via INTRAVENOUS
  Filled 2011-11-10 (×3): qty 10

## 2011-11-10 MED ORDER — INSULIN REGULAR HUMAN 100 UNIT/ML IJ SOLN
5.0000 [IU] | Freq: Three times a day (TID) | INTRAMUSCULAR | Status: DC | PRN
  Filled 2011-11-10: qty 0.05

## 2011-11-10 MED ORDER — PANCRELIPASE (LIP-PROT-AMYL) 12000-38000 UNITS PO CPEP
2.0000 | ORAL_CAPSULE | Freq: Three times a day (TID) | ORAL | Status: DC
  Administered 2011-11-10 – 2011-11-12 (×7): 2 via ORAL
  Filled 2011-11-10 (×9): qty 2

## 2011-11-10 MED ORDER — ALBUTEROL SULFATE HFA 108 (90 BASE) MCG/ACT IN AERS
2.0000 | INHALATION_SPRAY | Freq: Four times a day (QID) | RESPIRATORY_TRACT | Status: DC | PRN
  Filled 2011-11-10: qty 6.7

## 2011-11-10 MED ORDER — SIMVASTATIN 10 MG PO TABS
10.0000 mg | ORAL_TABLET | Freq: Every day | ORAL | Status: DC
  Administered 2011-11-10 – 2011-11-11 (×2): 10 mg via ORAL
  Filled 2011-11-10 (×3): qty 1

## 2011-11-10 MED ORDER — AMLODIPINE BESYLATE 10 MG PO TABS
10.0000 mg | ORAL_TABLET | Freq: Every day | ORAL | Status: DC
  Administered 2011-11-10 – 2011-11-12 (×3): 10 mg via ORAL
  Filled 2011-11-10 (×3): qty 1

## 2011-11-10 MED ORDER — PREDNISONE 20 MG PO TABS
60.0000 mg | ORAL_TABLET | Freq: Once | ORAL | Status: AC
  Administered 2011-11-10: 60 mg via ORAL
  Filled 2011-11-10: qty 3

## 2011-11-10 MED ORDER — ALBUTEROL SULFATE HFA 108 (90 BASE) MCG/ACT IN AERS
2.0000 | INHALATION_SPRAY | RESPIRATORY_TRACT | Status: DC
  Filled 2011-11-10: qty 6.7

## 2011-11-10 MED ORDER — ASPIRIN EC 81 MG PO TBEC
81.0000 mg | DELAYED_RELEASE_TABLET | Freq: Every day | ORAL | Status: DC
  Administered 2011-11-11 – 2011-11-12 (×2): 81 mg via ORAL
  Filled 2011-11-10 (×3): qty 1

## 2011-11-10 MED ORDER — FUROSEMIDE 40 MG PO TABS
40.0000 mg | ORAL_TABLET | Freq: Two times a day (BID) | ORAL | Status: DC
  Administered 2011-11-10 – 2011-11-12 (×4): 40 mg via ORAL
  Filled 2011-11-10 (×6): qty 1

## 2011-11-10 MED ORDER — FLUTICASONE-SALMETEROL 100-50 MCG/DOSE IN AEPB
1.0000 | INHALATION_SPRAY | Freq: Two times a day (BID) | RESPIRATORY_TRACT | Status: DC
  Administered 2011-11-10 – 2011-11-12 (×5): 1 via RESPIRATORY_TRACT
  Filled 2011-11-10: qty 14

## 2011-11-10 MED ORDER — ALBUTEROL SULFATE (5 MG/ML) 0.5% IN NEBU
5.0000 mg | INHALATION_SOLUTION | Freq: Once | RESPIRATORY_TRACT | Status: AC
  Administered 2011-11-10: 5 mg via RESPIRATORY_TRACT
  Filled 2011-11-10: qty 1

## 2011-11-10 MED ORDER — INSULIN NPH (HUMAN) (ISOPHANE) 100 UNIT/ML ~~LOC~~ SUSP
15.0000 [IU] | Freq: Two times a day (BID) | SUBCUTANEOUS | Status: DC
  Administered 2011-11-10 – 2011-11-11 (×4): 15 [IU] via SUBCUTANEOUS
  Filled 2011-11-10 (×2): qty 10

## 2011-11-10 MED ORDER — TRAMADOL HCL 50 MG PO TABS: 50.0000 mg | ORAL_TABLET | Freq: Four times a day (QID) | ORAL | Status: DC | PRN

## 2011-11-10 NOTE — ED Notes (Signed)
Pt w/acute onset mid-sternal chest pain that radiate to back.  Initial pain was 5/10, but on EMS arrival pain was at a 2/10.   He was initially experienced sob with pain, but used inhaler and sob decreased.  However, with ambulation to stretcher pt became sob again.  HX of copd and quadruple bipass.  Pt took 500 mg asa prior to EMS arrival and was given 2 0.4mg  nitro which took away pain.  20G SL to  LFA.

## 2011-11-10 NOTE — Consult Note (Signed)
Admit date: 11/10/2011 Referring Physician  [facility]  Primary Cardiologist  Verdis Prime, M.D. Reason for Consultation  history of coronary artery disease, chest pain  HPI: 78 year old man with COPD and coronary artery disease with bypass surgery several years ago.  He woke up this morning with some pain in his chest.  He was in the center of his chest.  It was different than the pain he had prior to his bypass surgery.  Of late, he has been in his usual state of health.  He does not walk much because of COPD.  He has not had any chest pain before today.  His shortness of breath has been at his baseline.  He continues to use his inhalers.  He called EMS.  His chest pain was starting to go away before he received a dose of nitroglycerin from EMS.  It is now completely gone.  He is not sure if the nitroglycerin helped or not since it was already going away.  His workup from a cardiac standpoint was negative in the emergency room.  He did have a white blood cell count of 22,000.  He was given a dose of Avelox.     PMH:   Past Medical History  Diagnosis Date  . COPD (chronic obstructive pulmonary disease)   . Hypertension   . Renal disorder   . Diabetes mellitus      PSH:   Past Surgical History  Procedure Date  . Quadruple bipass   . Hernia repair     inguinal  . Vascular surgery     Allergies:  Codeine and Dilaudid Prior to Admit Meds:   (Not in a hospital admission) Fam HX:   No family history on file. Social HX:    History   Social History  . Marital Status: Married    Spouse Name: N/A    Number of Children: N/A  . Years of Education: N/A   Occupational History  . Not on file.   Social History Main Topics  . Smoking status: Not on file  . Smokeless tobacco: Former Neurosurgeon    Quit date: 11/09/1982  . Alcohol Use:   . Drug Use:   . Sexually Active:    Other Topics Concern  . Not on file   Social History Narrative  . No narrative on file     ROS:  All  11 ROS were addressed and are negative except what is stated in the HPI  Physical Exam: Blood pressure 121/67, pulse 107, temperature 98 F (36.7 C), temperature source Oral, resp. rate 19, SpO2 96.00%.   General: Well developed, well nourished, in no acute distress Head: Eyes PERRLA,    Normal cephalic and atramatic  Lungs:   Clear bilaterally to auscultation and percussion. Heart:   HRRR S1 S2  Abdomen:  abdomen soft and non-tender  Msk:  Back normal,  Normal strength and tone for age. Extremities:   No  edema.  DP +1 Neuro: Alert and oriented X 3. Psych:  Good affect, responds appropriately    Labs:   Lab Results  Component Value Date   WBC 22.2* 11/10/2011   HGB 12.9* 11/10/2011   HCT 38.0* 11/10/2011   MCV 94.3 11/10/2011   PLT 269 11/10/2011    Lab 11/10/11 0553 11/10/11 0541  NA 141 --  K 3.4* --  CL 105 --  CO2 -- 27  BUN 56* --  CREATININE 3.90* --  CALCIUM -- 10.2  PROT -- 7.2  BILITOT -- 0.3  ALKPHOS -- 62  ALT -- 12  AST -- 19  GLUCOSE 304* --   No results found for this basename: PTT   No results found for this basename: INR, PROTIME   Lab Results  Component Value Date   TROPONINI <0.30 11/10/2011     No results found for this basename: CHOL   No results found for this basename: HDL   No results found for this basename: LDLCALC   No results found for this basename: TRIG   No results found for this basename: CHOLHDL   No results found for this basename: LDLDIRECT      Radiology:  Dg Chest Portable 1 View  11/10/2011  *RADIOLOGY REPORT*  Clinical Data: Chest pain.  PORTABLE CHEST - 1 VIEW  Comparison: 10/25/2009  Findings: Superimposed on underlying COPD is increased vascular prominence and interstitial prominence suggestive of interstitial edema.  The heart size is stable status post prior CABG.  No pleural effusions or areas of pulmonary consolidation are identified.  IMPRESSION: COPD and interstitial edema.  Original Report Authenticated By: Reola Calkins, M.D.    EKG:  Normal sinus rhythm, no significant ST- T wave changes  ASSESSMENT: #1 atypical chest pain  PLAN:  #1 rule out MI with enzymes.  Watch on telemetry.  My suspicion that this is cardiac is quite low.  It would not explain his white blood cell count.  He is also somewhat tachycardic here in the emergency room.  I spoke with Dr. Dierdre Highman regarding the possibility of pulmonary embolus.  He will pursue this.  Given his renal insufficiency, we want to minimize dye exposure unless absolutely necessary.  Corky Crafts., MD  11/10/2011  9:07 AM

## 2011-11-10 NOTE — ED Notes (Signed)
Admitting MD at bedside.

## 2011-11-10 NOTE — Progress Notes (Signed)
Pt Troponin level 0.45, Dr Beverly Gust notified and no new order received.

## 2011-11-10 NOTE — H&P (Signed)
PCP:  No primary provider on file. Chief Complaint:  Chest pain x am  HPI:  Patient 78 year old Caucasian male with history of her coronary artery disease status post CABG, COPD, was said to be in stable health until this morning when he was woken up by chest pain. He described archy, retrosternal, 5/10 intensity I was radiating to the back. He denied any history of shortness of breath. He denied any palpitation. He denied any diaphoresis. He denied any nausea or vomiting. He is a denied any history of fever, chills, and rigors. Patient was given aspirin and nitroglycerin and chest pain resolved. Patient is being admitted to be ruled out for ACS  Review of Systems:  The patient denies anorexia, fever, weight loss,, vision loss, decreased hearing, hoarseness,mild chest pain, syncope, dyspnea on exertion, peripheral edema, balance deficits, hemoptysis, abdominal pain, melena, hematochezia, severe indigestion/heartburn, hematuria, incontinence, genital sores, muscle weakness, suspicious skin lesions, transient blindness, difficulty walking, depression, unusual weight change, abnormal bleeding, enlarged lymph nodes, angioedema, and breast masses.  Past Medical History:  Past Medical History  Diagnosis Date  . COPD (chronic obstructive pulmonary disease)   . Hypertension   . Renal disorder   . Diabetes mellitus     Past Surgical History  Procedure Date  . Quadruple bipass   . Hernia repair     inguinal  . Vascular surgery     Medications:  Prior to Admission medications   Medication Sig Start Date End Date Taking? Authorizing Provider  albuterol (PROVENTIL HFA;VENTOLIN HFA) 108 (90 BASE) MCG/ACT inhaler Inhale 2 puffs into the lungs every 6 (six) hours as needed. For wheeze or shortness of breath     Yes Historical Provider, MD  amLODipine (NORVASC) 5 MG tablet Take 5 mg by mouth daily.     Yes Historical Provider, MD  aspirin EC 81 MG tablet Take 81 mg by mouth daily.     Yes  Historical Provider, MD  doxazosin (CARDURA) 8 MG tablet Take 8 mg by mouth at bedtime.     Yes Historical Provider, MD  Fluticasone-Salmeterol (ADVAIR) 100-50 MCG/DOSE AEPB Inhale 1 puff into the lungs every 12 (twelve) hours.     Yes Historical Provider, MD  furosemide (LASIX) 40 MG tablet Take 40-80 mg by mouth 2 (two) times daily. 2 in the morning and 1 at night    Yes Historical Provider, MD  insulin NPH (HUMULIN N,NOVOLIN N) 100 UNIT/ML injection Inject 5-25 Units into the skin 2 (two) times daily. Based on sliding scale in the morning and mid-morning    Yes Historical Provider, MD  insulin regular (NOVOLIN R,HUMULIN R) 100 units/mL injection Inject 4-14 Units into the skin 3 (three) times daily as needed. If blood sugar is over 120, take 4 units in the morning. If below 120, do not take in the morning. 6-16 units mid-morning along with Humulin N. 4-14 units at bedtime    Yes Historical Provider, MD  lipase/protease/amylase (CREON-10/PANCREASE) 12000 UNITS CPEP Take 2 capsules by mouth 3 (three) times daily before meals.     Yes Historical Provider, MD  metoprolol (LOPRESSOR) 50 MG tablet Take 50 mg by mouth 2 (two) times daily.     Yes Historical Provider, MD  Multiple Vitamins-Minerals (MULTIVITAMINS THER. W/MINERALS) TABS Take 1 tablet by mouth daily.     Yes Historical Provider, MD  omeprazole (PRILOSEC) 20 MG capsule Take 20 mg by mouth daily.     Yes Historical Provider, MD  OVER THE COUNTER MEDICATION Take 1 tablet  by mouth daily. Legatrin PM    Yes Historical Provider, MD  paricalcitol (ZEMPLAR) 1 MCG capsule Take 1 mcg by mouth daily.     Yes Historical Provider, MD  quiNINE (QUALAQUIN) 324 MG capsule Take 648 mg by mouth every evening.     Yes Historical Provider, MD  simvastatin (ZOCOR) 10 MG tablet Take 10 mg by mouth at bedtime.     Yes Historical Provider, MD  tiotropium (SPIRIVA) 18 MCG inhalation capsule Place 18 mcg into inhaler and inhale daily.     Yes Historical Provider, MD    traMADol (ULTRAM) 50 MG tablet Take 50 mg by mouth every 6 (six) hours as needed. Maximum dose= 8 tablets per day    Yes Historical Provider, MD    Allergies:  Allergies  Allergen Reactions  . Codeine     nausea  . Dilaudid (Hydromorphone Hcl)     diaphoresis    Social History:   does not have a smoking history on file. He quit smokeless tobacco use about 29 years ago. His alcohol and drug histories not on file.  Family History:  No family history on file.  Physical Exam:  Filed Vitals:   11/10/11 0524 11/10/11 0525 11/10/11 0703 11/10/11 0942  BP: 121/67   166/85  Pulse: 107   105  Temp: 98 F (36.7 C)   97.7 F (36.5 C)  TempSrc: Oral   Oral  Resp: 19   18  Height:    5\' 9"  (1.753 m)  Weight:    79.7 kg (175 lb 11.3 oz)  SpO2: 92% 94% 96% 95%      General: Alert and oriented times three, well developed and nourished, no acute distress  Eyes: PERRLA, pink conjunctiva, scleral icterus  ENT: Moist oral mucosa, neck supple, no thyromegaly  Lungs: clear to ascultation, no wheeze, no crackles, no use of accessory muscles  Cardiovascular: regular rate and rhythm, no regurgitation, no gallops, no murmurs. No carotid bruits, no JVD  Abdomen: soft, positive BS, non-tender, non-distended, no organomegaly, not an acute abdomen  GU: not examined  Neuro: CN II - XII grossly intact, sensation intact  Musculoskeletal: strength 5/5 all extremities, no clubbing, cyanosis or edema  Skin: no rash, no subcutaneous crepitation, no decubitus  Psych: appropriate patient  ?  Labs on Admission:   [facility] [address] 11/10/11 0553 11/10/11 0541  NA 141 139  K 3.4* 3.3*  CL 105 100  CO2 -- 27  GLUCOSE 304* 296*  BUN 56* 58*  CREATININE 3.90* 3.97*  CALCIUM -- 10.2  MG -- --  PHOS -- --     Basename 11/10/11 0541  AST 19  ALT 12  ALKPHOS 62  BILITOT 0.3  PROT 7.2  ALBUMIN 3.2*     Basename 11/10/11 0541  LIPASE 6*  AMYLASE --     Basename 11/10/11 0553  11/10/11 0541  WBC -- 22.2*  NEUTROABS -- --  HGB 12.9* 12.4*  HCT 38.0* 37.9*  MCV -- 94.3  PLT -- 269     Basename 11/10/11 0541  CKTOTAL --  CKMB --  CKMBINDEX --  TROPONINI <0.30    No results found for this basename: TSH,T4TOTAL,FREET3,T3FREE,THYROIDAB in the last 72 hours  No results found for this basename: VITAMINB12:2,FOLATE:2,FERRITIN:2,TIBC:2,IRON:2,RETICCTPCT:2 in the last 72 hours  Radiological Exams on Admission:  Dg Chest Portable 1 View  11/10/2011  *RADIOLOGY REPORT*  Clinical Data: Chest pain.  PORTABLE CHEST - 1 VIEW  Comparison: 10/25/2009  Findings: Superimposed on underlying COPD is  increased vascular prominence and interstitial prominence suggestive of interstitial edema.  The heart size is stable status post prior CABG.  No pleural effusions or areas of pulmonary consolidation are identified.  IMPRESSION: COPD and interstitial edema.  Original Report Authenticated By: Reola Calkins, M.D.    Assessment/Plan Problems: #1 chest pain #2 leukocytosis-etiology is unknown.  Impression: #1 chest pain-resolved with aspirin and nitroglycerin questionable angina. #2 leukocytosis- unknown aetiology. #3 coronary artery disease status post CABG #4 history of COPD  #5 hypertension #6 question of BPH #7 diabetes mellitus #8 chronic kidney disease stage III/IV  Plan: #1 admit patient to telemetry #2 start aspirin and nitroglycerin. #3 empiric treatment for leukocytosis we IV Rocephin and Zithromax #4 treat for COPD #5 restart home meds Labs; cardiac enzymes daily x3, blood culture x2) IV antibiotics, 2-D echo, hematologic indices as well as lytes. She'll be followed and evaluated on daily basis. Talmage Nap [phone]

## 2011-11-10 NOTE — ED Notes (Signed)
No wheezing noted.  Lung sounds clear bil.  R 1+ non-pitting edema to RLE. Pt presently denies any pain.

## 2011-11-10 NOTE — ED Provider Notes (Signed)
History     CSN: [id number]  Arrival date & time 11/10/11  4098   First MD Initiated Contact with Patient 11/10/11 0541      Chief Complaint  Patient presents with  . Chest Pain  . Shortness of Breath    (Consider location/radiation/quality/duration/timing/severity/associated sxs/prior treatment) Patient is a 78 y.o. male presenting with chest pain. The history is provided by the patient and the spouse.  Chest Pain The chest pain began 1 - 2 hours ago. Duration of episode(s) is 60 minutes. Chest pain occurs constantly. The chest pain is resolved. Associated with: Nothing. At its most intense, the pain is at 6/10. The pain is currently at 1/10. The severity of the pain is moderate. The quality of the pain is described as dull. The pain radiates to the upper back. Chest pain is worsened by exertion (Relieved with nitroglycerin in route. Aspirin prior to arrival.). Primary symptoms include shortness of breath. Pertinent negatives for primary symptoms include no fever, no abdominal pain, no nausea and no vomiting.  Associated symptoms include diaphoresis. He tried nitroglycerin and aspirin for the symptoms. Risk factors include being elderly.  His past medical history is significant for CAD.  Procedure history is positive for cardiac catheterization.    patient woke from sleep with chest pain radiates to the back. He does have a history of COPD but denies any history of similar symptoms. He has remote history of CABG and is followed by Dr. Verdis Prime. He recently was given a clean bill of health. Chest pain resolved with nitroglycerin in route. No recent cough or fevers. No shortness of breath at rest. No abdominal pain. No nausea or vomiting. Patient does take Lasix for some right lower extremity swelling but denies any history of CHF.  Past Medical History  Diagnosis Date  . COPD (chronic obstructive pulmonary disease)   . Hypertension   . Renal disorder   . Diabetes mellitus     Past  Surgical History  Procedure Date  . Quadruple bipass   . Hernia repair     inguinal  . Vascular surgery     No family history on file.  History  Substance Use Topics  . Smoking status: Not on file  . Smokeless tobacco: Former Neurosurgeon    Quit date: 11/09/1982  . Alcohol Use:       Review of Systems  Constitutional: Positive for diaphoresis. Negative for fever and chills.  HENT: Negative for neck pain and neck stiffness.   Eyes: Negative for pain.  Respiratory: Positive for shortness of breath.   Cardiovascular: Positive for chest pain.  Gastrointestinal: Negative for nausea, vomiting and abdominal pain.  Genitourinary: Negative for dysuria.  Musculoskeletal: Negative for back pain.  Skin: Negative for rash.  Neurological: Negative for headaches.  All other systems reviewed and are negative.    Allergies  Codeine and Dilaudid  Home Medications   Current Outpatient Rx  Name Route Sig Dispense Refill  . ALBUTEROL SULFATE HFA 108 (90 BASE) MCG/ACT IN AERS Inhalation Inhale 2 puffs into the lungs every 6 (six) hours as needed. For wheeze or shortness of breath      . AMLODIPINE BESYLATE 5 MG PO TABS Oral Take 5 mg by mouth daily.      . ASPIRIN EC 81 MG PO TBEC Oral Take 81 mg by mouth daily.      Marland Kitchen DOXAZOSIN MESYLATE 8 MG PO TABS Oral Take 8 mg by mouth at bedtime.      Marland Kitchen  FLUTICASONE-SALMETEROL 100-50 MCG/DOSE IN AEPB Inhalation Inhale 1 puff into the lungs every 12 (twelve) hours.      . FUROSEMIDE 40 MG PO TABS Oral Take 40-80 mg by mouth 2 (two) times daily. 2 in the morning and 1 at night     . INSULIN ISOPHANE HUMAN 100 UNIT/ML Simpson SUSP Subcutaneous Inject 5-25 Units into the skin 2 (two) times daily. Based on sliding scale in the morning and mid-morning     . INSULIN REGULAR HUMAN 100 UNIT/ML IJ SOLN Subcutaneous Inject 4-14 Units into the skin 3 (three) times daily as needed. If blood sugar is over 120, take 4 units in the morning. If below 120, do not take in the  morning. 6-16 units mid-morning along with Humulin N. 4-14 units at bedtime     . PANCRELIPASE (LIP-PROT-AMYL) 12000 UNITS PO CPEP Oral Take 2 capsules by mouth 3 (three) times daily before meals.      Marland Kitchen METOPROLOL TARTRATE 50 MG PO TABS Oral Take 50 mg by mouth 2 (two) times daily.      Carma Leaven M PLUS PO TABS Oral Take 1 tablet by mouth daily.      Marland Kitchen OMEPRAZOLE 20 MG PO CPDR Oral Take 20 mg by mouth daily.      Marland Kitchen OVER THE COUNTER MEDICATION Oral Take 1 tablet by mouth daily. Legatrin PM     . PARICALCITOL 1 MCG PO CAPS Oral Take 1 mcg by mouth daily.      . QUININE SULFATE 324 MG PO CAPS Oral Take 648 mg by mouth every evening.      Marland Kitchen SIMVASTATIN 10 MG PO TABS Oral Take 10 mg by mouth at bedtime.      Marland Kitchen TIOTROPIUM BROMIDE MONOHYDRATE 18 MCG IN CAPS Inhalation Place 18 mcg into inhaler and inhale daily.      . TRAMADOL HCL 50 MG PO TABS Oral Take 50 mg by mouth every 6 (six) hours as needed. Maximum dose= 8 tablets per day       BP 121/67  Pulse 107  Temp(Src) 98 F (36.7 C) (Oral)  Resp 19  SpO2 94%  Physical Exam  Constitutional: He is oriented to person, place, and time. He appears well-developed and well-nourished.  HENT:  Head: Normocephalic and atraumatic.  Eyes: Conjunctivae and EOM are normal. Pupils are equal, round, and reactive to light.  Neck: Trachea normal. Neck supple. No thyromegaly present.  Cardiovascular: Normal rate, regular rhythm, S1 normal, S2 normal and normal pulses.     No systolic murmur is present   No diastolic murmur is present  Pulses:      Radial pulses are 2+ on the right side, and 2+ on the left side.  Pulmonary/Chest: He has no wheezes. He has no rhonchi. He has no rales. He exhibits no tenderness.       Decreased breath sounds bilaterally with no tachypnea or respiratory distress.  Abdominal: Soft. Normal appearance and bowel sounds are normal. There is no tenderness. There is no CVA tenderness and negative Murphy's sign.  Musculoskeletal:        BLE:s Calves nontender, no cords or erythema, negative Homans sign. 1+ right pedal edema baseline per patient  Neurological: He is alert and oriented to person, place, and time. He has normal strength. No cranial nerve deficit or sensory deficit. GCS eye subscore is 4. GCS verbal subscore is 5. GCS motor subscore is 6.  Skin: Skin is warm and dry. No rash noted. He is not  diaphoretic.  Psychiatric: His speech is normal.       Cooperative and appropriate    ED Course  Procedures (including critical care time)  Results for orders placed during the hospital encounter of 11/10/11  CBC      Component Value Range   WBC 22.2 (*) 4.0 - 10.5 (K/uL)   RBC 4.02 (*) 4.22 - 5.81 (MIL/uL)   Hemoglobin 12.4 (*) 13.0 - 17.0 (g/dL)   HCT 60.4 (*) 54.0 - 52.0 (%)   MCV 94.3  78.0 - 100.0 (fL)   MCH 30.8  26.0 - 34.0 (pg)   MCHC 32.7  30.0 - 36.0 (g/dL)   RDW 98.1  19.1 - 47.8 (%)   Platelets 269  150 - 400 (K/uL)  TROPONIN I      Component Value Range   Troponin I <0.30  <0.30 (ng/mL)  COMPREHENSIVE METABOLIC PANEL      Component Value Range   Sodium 139  135 - 145 (mEq/L)   Potassium 3.3 (*) 3.5 - 5.1 (mEq/L)   Chloride 100  96 - 112 (mEq/L)   CO2 27  19 - 32 (mEq/L)   Glucose, Bld 296 (*) 70 - 99 (mg/dL)   BUN 58 (*) 6 - 23 (mg/dL)   Creatinine, Ser 2.95 (*) 0.50 - 1.35 (mg/dL)   Calcium 62.1  8.4 - 10.5 (mg/dL)   Total Protein 7.2  6.0 - 8.3 (g/dL)   Albumin 3.2 (*) 3.5 - 5.2 (g/dL)   AST 19  0 - 37 (U/L)   ALT 12  0 - 53 (U/L)   Alkaline Phosphatase 62  39 - 117 (U/L)   Total Bilirubin 0.3  0.3 - 1.2 (mg/dL)   GFR calc non Af Amer 13 (*) >90 (mL/min)   GFR calc Af Amer 15 (*) >90 (mL/min)  PRO B NATRIURETIC PEPTIDE      Component Value Range   Pro B Natriuretic peptide (BNP) 248.1  0 - 450 (pg/mL)  POCT I-STAT, CHEM 8      Component Value Range   Sodium 141  135 - 145 (mEq/L)   Potassium 3.4 (*) 3.5 - 5.1 (mEq/L)   Chloride 105  96 - 112 (mEq/L)   BUN 56 (*) 6 - 23 (mg/dL)    Creatinine, Ser 3.08 (*) 0.50 - 1.35 (mg/dL)   Glucose, Bld 657 (*) 70 - 99 (mg/dL)   Calcium, Ion 8.46  9.62 - 1.32 (mmol/L)   TCO2 27  0 - 100 (mmol/L)   Hemoglobin 12.9 (*) 13.0 - 17.0 (g/dL)   HCT 95.2 (*) 84.1 - 52.0 (%)        Date: 11/10/2011  Rate: 106  Rhythm: sinus tachycardia  QRS Axis: left  Intervals: normal  ST/T Wave abnormalities: nonspecific ST changes  Conduction Disutrbances:none  Narrative Interpretation:   Old EKG Reviewed: unchanged     Case discussed as above with Dr. Isabel Caprice and he recommends admit by internal medicine for rule out. Cardiology will consult.  [facility] hospitalist consulted agrees to admit. Recheck at 8:10 AM patient remains pain-free with no symptoms. He is agreeable to admission  MDM   Chest pain with cardiac history. Aspirin prior to arrival. Nitroglycerin in route relieved pain. Asymptomatic in emergency department. EKG, chest x-ray labs reviewed as above. Cardiology consult. Medicine admission.        Sunnie Nielsen, MD 11/10/11 [phone]

## 2011-11-11 ENCOUNTER — Encounter (HOSPITAL_COMMUNITY): Payer: Self-pay | Admitting: Interventional Cardiology

## 2011-11-11 LAB — CBC
Hemoglobin: 11.7 g/dL — ABNORMAL LOW (ref 13.0–17.0)
MCV: 93.6 fL (ref 78.0–100.0)
Platelets: 286 10*3/uL (ref 150–400)
RBC: 3.76 MIL/uL — ABNORMAL LOW (ref 4.22–5.81)
WBC: 15.2 10*3/uL — ABNORMAL HIGH (ref 4.0–10.5)

## 2011-11-11 LAB — COMPREHENSIVE METABOLIC PANEL
ALT: 12 U/L (ref 0–53)
AST: 22 U/L (ref 0–37)
CO2: 23 mEq/L (ref 19–32)
Chloride: 100 mEq/L (ref 96–112)
Creatinine, Ser: 4.17 mg/dL — ABNORMAL HIGH (ref 0.50–1.35)
GFR calc non Af Amer: 12 mL/min — ABNORMAL LOW (ref >90)
Sodium: 137 mEq/L (ref 135–145)
Total Bilirubin: 0.2 mg/dL — ABNORMAL LOW (ref 0.3–1.2)

## 2011-11-11 LAB — GLUCOSE, CAPILLARY
Glucose-Capillary: 253 mg/dL — ABNORMAL HIGH (ref 70–99)
Glucose-Capillary: 176 mg/dL — ABNORMAL HIGH (ref 70–99)

## 2011-11-11 LAB — CARDIAC PANEL(CRET KIN+CKTOT+MB+TROPI): CK, MB: 3.3 ng/mL (ref 0.3–4.0)

## 2011-11-11 MED ORDER — POTASSIUM CHLORIDE CRYS ER 10 MEQ PO TBCR: 10.0000 meq | EXTENDED_RELEASE_TABLET | Freq: Two times a day (BID) | ORAL | Status: DC

## 2011-11-11 MED ORDER — ISOSORBIDE MONONITRATE ER 30 MG PO TB24
30.0000 mg | ORAL_TABLET | Freq: Every day | ORAL | Status: DC
  Administered 2011-11-11: 30 mg via ORAL
  Filled 2011-11-11 (×2): qty 1

## 2011-11-11 MED ORDER — ENOXAPARIN SODIUM 30 MG/0.3ML ~~LOC~~ SOLN
30.0000 mg | SUBCUTANEOUS | Status: DC
  Administered 2011-11-11: 30 mg via SUBCUTANEOUS
  Filled 2011-11-11 (×2): qty 0.3

## 2011-11-11 NOTE — Progress Notes (Signed)
Patient ID: Raymond [name], male   DOB: [birth year], 78 y.o.   MRN: [id number] Subjective: Patient seen. Denies any chest pain. Cardiac enzymes showed increasing trend. Cardiologist on case.  Objective: Weight change:   Intake/Output Summary (Last 24 hours) at 11/11/11 1041 Last data filed at 11/11/11 0757  Gross per 24 hour  Intake   1200 ml  Output    801 ml  Net    399 ml   BP 127/75  Pulse 86  Temp(Src) 97.8 F (36.6 C) (Oral)  Resp 17  Ht 5\' 9"  (1.753 m)  Wt 79.7 kg (175 lb 11.3 oz)  BMI 25.95 kg/m2  SpO2 97% Physical Exam: General appearance: alert, cooperative and no distress Head: Normocephalic, without obvious abnormality, atraumatic Neck: no adenopathy, no carotid bruit, no JVD, supple, symmetrical, trachea midline and thyroid not enlarged, symmetric, no tenderness/mass/nodules Lungs: clear to auscultation bilaterally Heart: regular rate and rhythm, S1, S2 normal, no murmur, click, rub or gallop Abdomen: soft, non-tender; bowel sounds normal; no masses,  no organomegaly Extremities: extremities normal, atraumatic, no cyanosis or edema Skin: Skin color, texture, turgor normal. No rashes or lesions  Lab Results: Results for orders placed during the hospital encounter of 11/10/11 (from the past 48 hour(s))  PRO B NATRIURETIC PEPTIDE     Status: Normal   Collection Time   11/10/11  5:40 AM      Component Value Range Comment   Pro B Natriuretic peptide (BNP) 248.1  0 - 450 (pg/mL)   CBC     Status: Abnormal   Collection Time   11/10/11  5:41 AM      Component Value Range Comment   WBC 22.2 (*) 4.0 - 10.5 (K/uL)    RBC 4.02 (*) 4.22 - 5.81 (MIL/uL)    Hemoglobin 12.4 (*) 13.0 - 17.0 (g/dL)    HCT 84.6 (*) 96.2 - 52.0 (%)    MCV 94.3  78.0 - 100.0 (fL)    MCH 30.8  26.0 - 34.0 (pg)    MCHC 32.7  30.0 - 36.0 (g/dL)    RDW 95.2  84.1 - 32.4 (%)    Platelets 269  150 - 400 (K/uL)   TROPONIN I     Status: Normal   Collection Time   11/10/11  5:41 AM      Component  Value Range Comment   Troponin I <0.30  <0.30 (ng/mL)   COMPREHENSIVE METABOLIC PANEL     Status: Abnormal   Collection Time   11/10/11  5:41 AM      Component Value Range Comment   Sodium 139  135 - 145 (mEq/L)    Potassium 3.3 (*) 3.5 - 5.1 (mEq/L)    Chloride 100  96 - 112 (mEq/L)    CO2 27  19 - 32 (mEq/L)    Glucose, Bld 296 (*) 70 - 99 (mg/dL)    BUN 58 (*) 6 - 23 (mg/dL)    Creatinine, Ser 4.01 (*) 0.50 - 1.35 (mg/dL)    Calcium 02.7  8.4 - 10.5 (mg/dL)    Total Protein 7.2  6.0 - 8.3 (g/dL)    Albumin 3.2 (*) 3.5 - 5.2 (g/dL)    AST 19  0 - 37 (U/L)    ALT 12  0 - 53 (U/L)    Alkaline Phosphatase 62  39 - 117 (U/L)    Total Bilirubin 0.3  0.3 - 1.2 (mg/dL)    GFR calc non Af Amer 13 (*) >90 (mL/min)  GFR calc Af Amer 15 (*) >90 (mL/min)   LIPASE, BLOOD     Status: Abnormal   Collection Time   11/10/11  5:41 AM      Component Value Range Comment   Lipase 6 (*) 11 - 59 (U/L)   POCT I-STAT, CHEM 8     Status: Abnormal   Collection Time   11/10/11  5:53 AM      Component Value Range Comment   Sodium 141  135 - 145 (mEq/L)    Potassium 3.4 (*) 3.5 - 5.1 (mEq/L)    Chloride 105  96 - 112 (mEq/L)    BUN 56 (*) 6 - 23 (mg/dL)    Creatinine, Ser 1.61 (*) 0.50 - 1.35 (mg/dL)    Glucose, Bld 096 (*) 70 - 99 (mg/dL)    Calcium, Ion 0.45  1.12 - 1.32 (mmol/L)    TCO2 27  0 - 100 (mmol/L)    Hemoglobin 12.9 (*) 13.0 - 17.0 (g/dL)    HCT 40.9 (*) 81.1 - 52.0 (%)   D-DIMER, QUANTITATIVE     Status: Abnormal   Collection Time   11/10/11  8:25 AM      Component Value Range Comment   D-Dimer, Quant 1.32 (*) 0.00 - 0.48 (ug/mL-FEU)   CARDIAC PANEL(CRET KIN+CKTOT+MB+TROPI)     Status: Normal   Collection Time   11/10/11 10:03 AM      Component Value Range Comment   Total CK 62  7 - 232 (U/L)    CK, MB 2.6  0.3 - 4.0 (ng/mL)    Troponin I <0.30  <0.30 (ng/mL)    Relative Index RELATIVE INDEX IS INVALID  0.0 - 2.5    GLUCOSE, CAPILLARY     Status: Abnormal   Collection Time   11/10/11  12:08 PM      Component Value Range Comment   Glucose-Capillary 329 (*) 70 - 99 (mg/dL)    Comment 1 Documented in Chart      Comment 2 Notify RN     MAGNESIUM     Status: Normal   Collection Time   11/10/11 12:27 PM      Component Value Range Comment   Magnesium 2.1  1.5 - 2.5 (mg/dL)   CBC     Status: Abnormal   Collection Time   11/10/11 12:27 PM      Component Value Range Comment   WBC 15.8 (*) 4.0 - 10.5 (K/uL)    RBC 4.13 (*) 4.22 - 5.81 (MIL/uL)    Hemoglobin 13.0  13.0 - 17.0 (g/dL)    HCT 91.4  78.2 - 95.6 (%)    MCV 94.7  78.0 - 100.0 (fL)    MCH 31.5  26.0 - 34.0 (pg)    MCHC 33.2  30.0 - 36.0 (g/dL)    RDW 21.3  08.6 - 57.8 (%)    Platelets 289  150 - 400 (K/uL)   DIFFERENTIAL     Status: Abnormal   Collection Time   11/10/11 12:27 PM      Component Value Range Comment   Neutrophils Relative 93 (*) 43 - 77 (%)    Neutro Abs 14.7 (*) 1.7 - 7.7 (K/uL)    Lymphocytes Relative 5 (*) 12 - 46 (%)    Lymphs Abs 0.8  0.7 - 4.0 (K/uL)    Monocytes Relative 2 (*) 3 - 12 (%)    Monocytes Absolute 0.3  0.1 - 1.0 (K/uL)    Eosinophils Relative 0  0 -  5 (%)    Eosinophils Absolute 0.0  0.0 - 0.7 (K/uL)    Basophils Relative 0  0 - 1 (%)    Basophils Absolute 0.0  0.0 - 0.1 (K/uL)   CULTURE, BLOOD (ROUTINE X 2)     Status: Normal (Preliminary result)   Collection Time   11/10/11 12:30 PM      Component Value Range Comment   Specimen Description BLOOD RIGHT ANTECUBITAL      Special Requests BOTTLES DRAWN AEROBIC AND ANAEROBIC 10CC      Setup Time [id number]      Culture        Value:        BLOOD CULTURE RECEIVED NO GROWTH TO DATE CULTURE WILL BE HELD FOR 5 DAYS BEFORE ISSUING A FINAL NEGATIVE REPORT   Report Status PENDING     CULTURE, BLOOD (ROUTINE X 2)     Status: Normal (Preliminary result)   Collection Time   11/10/11 12:33 PM      Component Value Range Comment   Specimen Description BLOOD LEFT HAND      Special Requests BOTTLES DRAWN AEROBIC ONLY 10CC      Setup Time  [id number]      Culture        Value:        BLOOD CULTURE RECEIVED NO GROWTH TO DATE CULTURE WILL BE HELD FOR 5 DAYS BEFORE ISSUING A FINAL NEGATIVE REPORT   Report Status PENDING     CARDIAC PANEL(CRET KIN+CKTOT+MB+TROPI)     Status: Abnormal   Collection Time   11/10/11  4:58 PM      Component Value Range Comment   Total CK 74  7 - 232 (U/L)    CK, MB 3.8  0.3 - 4.0 (ng/mL)    Troponin I 0.45 (*) <0.30 (ng/mL)    Relative Index RELATIVE INDEX IS INVALID  0.0 - 2.5    GLUCOSE, CAPILLARY     Status: Abnormal   Collection Time   11/10/11  9:53 PM      Component Value Range Comment   Glucose-Capillary 348 (*) 70 - 99 (mg/dL)   CARDIAC PANEL(CRET KIN+CKTOT+MB+TROPI)     Status: Abnormal   Collection Time   11/11/11  1:35 AM      Component Value Range Comment   Total CK 67  7 - 232 (U/L)    CK, MB 3.3  0.3 - 4.0 (ng/mL)    Troponin I 0.47 (*) <0.30 (ng/mL)    Relative Index RELATIVE INDEX IS INVALID  0.0 - 2.5    CBC     Status: Abnormal   Collection Time   11/11/11  1:35 AM      Component Value Range Comment   WBC 15.2 (*) 4.0 - 10.5 (K/uL)    RBC 3.76 (*) 4.22 - 5.81 (MIL/uL)    Hemoglobin 11.7 (*) 13.0 - 17.0 (g/dL)    HCT 21.3 (*) 08.6 - 52.0 (%)    MCV 93.6  78.0 - 100.0 (fL)    MCH 31.1  26.0 - 34.0 (pg)    MCHC 33.2  30.0 - 36.0 (g/dL)    RDW 57.8  46.9 - 62.9 (%)    Platelets 286  150 - 400 (K/uL)   COMPREHENSIVE METABOLIC PANEL     Status: Abnormal   Collection Time   11/11/11  1:35 AM      Component Value Range Comment   Sodium 137  135 - 145 (mEq/L)    Potassium 3.8  3.5 - 5.1 (mEq/L)    Chloride 100  96 - 112 (mEq/L)    CO2 23  19 - 32 (mEq/L)    Glucose, Bld 357 (*) 70 - 99 (mg/dL)    BUN 60 (*) 6 - 23 (mg/dL)    Creatinine, Ser 1.61 (*) 0.50 - 1.35 (mg/dL)    Calcium 9.5  8.4 - 10.5 (mg/dL)    Total Protein 6.9  6.0 - 8.3 (g/dL)    Albumin 3.1 (*) 3.5 - 5.2 (g/dL)    AST 22  0 - 37 (U/L)    ALT 12  0 - 53 (U/L)    Alkaline Phosphatase 54  39 - 117 (U/L)     Total Bilirubin 0.2 (*) 0.3 - 1.2 (mg/dL)    GFR calc non Af Amer 12 (*) >90 (mL/min)    GFR calc Af Amer 14 (*) >90 (mL/min)   GLUCOSE, CAPILLARY     Status: Abnormal   Collection Time   11/11/11  6:04 AM      Component Value Range Comment   Glucose-Capillary 278 (*) 70 - 99 (mg/dL)    Comment 1 Notify RN       Micro Results: Recent Results (from the past 240 hour(s))  CULTURE, BLOOD (ROUTINE X 2)     Status: Normal (Preliminary result)   Collection Time   11/10/11 12:30 PM      Component Value Range Status Comment   Specimen Description BLOOD RIGHT ANTECUBITAL   Final    Special Requests BOTTLES DRAWN AEROBIC AND ANAEROBIC 10CC   Final    Setup Time [id number]   Final    Culture     Final    Value:        BLOOD CULTURE RECEIVED NO GROWTH TO DATE CULTURE WILL BE HELD FOR 5 DAYS BEFORE ISSUING A FINAL NEGATIVE REPORT   Report Status PENDING   Incomplete   CULTURE, BLOOD (ROUTINE X 2)     Status: Normal (Preliminary result)   Collection Time   11/10/11 12:33 PM      Component Value Range Status Comment   Specimen Description BLOOD LEFT HAND   Final    Special Requests BOTTLES DRAWN AEROBIC ONLY 10CC   Final    Setup Time [id number]   Final    Culture     Final    Value:        BLOOD CULTURE RECEIVED NO GROWTH TO DATE CULTURE WILL BE HELD FOR 5 DAYS BEFORE ISSUING A FINAL NEGATIVE REPORT   Report Status PENDING   Incomplete     Studies/Results: Dg Chest Portable 1 View  11/10/2011  *RADIOLOGY REPORT*  Clinical Data: Chest pain.  PORTABLE CHEST - 1 VIEW  Comparison: 10/25/2009  Findings: Superimposed on underlying COPD is increased vascular prominence and interstitial prominence suggestive of interstitial edema.  The heart size is stable status post prior CABG.  No pleural effusions or areas of pulmonary consolidation are identified.  IMPRESSION: COPD and interstitial edema.  Original Report Authenticated By: Reola Calkins, M.D.   Medications: Scheduled Meds:   . amLODipine   10 mg Oral Daily  . aspirin EC  81 mg Oral Daily  . azithromycin  500 mg Intravenous Q24H  . cefTRIAXone (ROCEPHIN)  IV  1 g Intravenous Q24H  . doxazosin  8 mg Oral QHS  . enoxaparin (LOVENOX) injection  30 mg Subcutaneous Q24H  . Fluticasone-Salmeterol  1 puff Inhalation Q12H  . furosemide  40 mg Oral BID  .  insulin NPH  15 Units Subcutaneous BID  . isosorbide mononitrate  30 mg Oral Daily  . lipase/protease/amylase  2 capsule Oral TID AC  . metoprolol  50 mg Oral BID  . multivitamins ther. w/minerals  1 tablet Oral Daily  . pantoprazole  40 mg Oral Q1200  . paricalcitol  1 mcg Oral Daily  . potassium chloride  10 mEq Oral BID  . quiNINE  648 mg Oral QPM  . simvastatin  10 mg Oral QHS  . tiotropium  18 mcg Inhalation Daily  . DISCONTD: albuterol  2 puff Inhalation Q4H  . DISCONTD: enoxaparin  40 mg Subcutaneous Q24H  . DISCONTD: insulin NPH  5-25 Units Subcutaneous BID   Continuous Infusions:  PRN Meds:.albuterol, insulin aspart, traMADol, DISCONTD: insulin regular  Assessment/Plan: #1 chest pain questionable unstable angina-cardiac enzymes trended up. We continue present management. Cardiology is also following patient. #2 coronary artery disease status post CABG-we'll continue aspirin, Zocor and Lopressor. #3 diabetes mellitus-continue insulin regimen. #4 chronic kidney disease- continue to monitor BUN and creatinine. #5 leukocytosis-source is unknown we continue empiric treatment with IV Rocephin as well as Zithromax. #6 BPH-we'll continue Cardura #7 history of COPD. #8 hypokalemia-potassium repletion We continue to follow patient along side with the cardiologist.   LOS: 1 day   Raymond [name] 11/11/2011, 10:41 AM

## 2011-11-11 NOTE — Progress Notes (Signed)
  Echocardiogram 2D Echocardiogram has been performed.  Raymond [name] 11/11/2011, 12:03 PM

## 2011-11-11 NOTE — Progress Notes (Signed)
Patient Name: Raymond [name] Date of Encounter: 11/11/2011    SUBJECTIVE: History was reviewed with the patient. He was awakened with midsternal discomfort that radiated through to his back. He denied shortness of breath.  The patient has significant comorbidities including O2 requiring COPD. He also has chronic kidney disease, stage IV.  TELEMETRY:  Normal sinus rhythm.: Filed Vitals:   11/10/11 1300 11/10/11 1430 11/10/11 2107 11/11/11 0500  BP:  152/89 148/87 127/75  Pulse:  95 102 86  Temp:  98 F (36.7 C) 97.5 F (36.4 C) 97.8 F (36.6 C)  TempSrc:  Oral Oral Oral  Resp:  18 18 17   Height:      Weight:      SpO2: 98% 95% 95% 97%    Intake/Output Summary (Last 24 hours) at 11/11/11 0848 Last data filed at 11/11/11 0757  Gross per 24 hour  Intake    960 ml  Output    501 ml  Net    459 ml    LABS: Basic Metabolic Panel:  Basename 11/11/11 0135 11/10/11 1227 11/10/11 0553 11/10/11 0541  NA 137 -- 141 --  K 3.8 -- 3.4* --  CL 100 -- 105 --  CO2 23 -- -- 27  GLUCOSE 357* -- 304* --  BUN 60* -- 56* --  CREATININE 4.17* -- 3.90* --  CALCIUM 9.5 -- -- 10.2  MG -- 2.1 -- --  PHOS -- -- -- --   CBC:  Basename 11/11/11 0135 11/10/11 1227  WBC 15.2* 15.8*  NEUTROABS -- 14.7*  HGB 11.7* 13.0  HCT 35.2* 39.1  MCV 93.6 94.7  PLT 286 289   Cardiac Enzymes:  Basename 11/11/11 0135 11/10/11 1658 11/10/11 1003  CKTOTAL 67 74 62  CKMB 3.3 3.8 2.6  CKMBINDEX -- -- --  TROPONINI 0.47* 0.45* <0.30   BNP: No components found with this basename: POCBNP:3 Hemoglobin A1C: No results found for this basename: HGBA1C in the last 72 hours Fasting Lipid Panel: No results found for this basename: CHOL,HDL,LDLCALC,TRIG,CHOLHDL,LDLDIRECT in the last 72 hours  Radiology/Studies:  RADIOLOGY REPORT*  Clinical Data: Chest pain.  PORTABLE CHEST - 1 VIEW  Comparison: 10/25/2009  Findings: Superimposed on underlying COPD is increased vascular  prominence and interstitial  prominence suggestive of interstitial  edema. The heart size is stable status post prior CABG. No  pleural effusions or areas of pulmonary consolidation are  identified.  IMPRESSION:  COPD and interstitial edema.  Original Report Authenticated By: Reola Calkins, M.D.   Physical Exam: Blood pressure 127/75, pulse 86, temperature 97.8 F (36.6 C), temperature source Oral, resp. rate 17, height 5\' 9"  (1.753 m), weight 79.7 kg (175 lb 11.3 oz), SpO2 97.00%. Weight change:    Decreased breath sounds. 1/6 systolic murmur. No peripheral edema.  ASSESSMENT:  1. Probable unstable angina pectoris. R/O Pulmonary embolism  2. Chronic kidney disease, stage V  3. Chronic O2 requiring COPD  4. Chronic coronary atherosclerotic heart disease with prior coronary grafting in the late 1990s  5. Difficulty with ambulation   Plan:  1. Exclude pulmonary embolism with VQ scan.  2. Given comorbidities, if presenting symptoms are related to coronary disease, high initial approach should be conservative medical therapy.  3. Add long-acting nitrates to the patient's chronic medical regimen.  4. We'll follow along with you.  Selinda Eon 11/11/2011, 8:48 AM

## 2011-11-11 NOTE — Progress Notes (Signed)
11/11/2011 Jaliya Siegmann [facility] Case Management Note [phone]  Utilization review completed.  

## 2011-11-11 NOTE — Progress Notes (Signed)
Inpatient Diabetes Program Recommendations  AACE/ADA: New Consensus Statement on Inpatient Glycemic Control (2009)  Target Ranges:  Prepandial:   less than 140 mg/dL      Peak postprandial:   less than 180 mg/dL (1-2 hours)      Critically ill patients:  140 - 180 mg/dL   Reason for Visit: CBGs greater than 180 mg/dl  Inpatient Diabetes Program Recommendations Correction (SSI): Start Novolog MODERATE correction scale AC & HS   Note: Discontinue Novolog 5 units with meals PRN

## 2011-11-12 LAB — CBC
HCT: 34.5 % — ABNORMAL LOW (ref 39.0–52.0)
Hemoglobin: 11.3 g/dL — ABNORMAL LOW (ref 13.0–17.0)
MCH: 30.8 pg (ref 26.0–34.0)
MCHC: 32.8 g/dL (ref 30.0–36.0)
MCV: 94 fL (ref 78.0–100.0)
RDW: 13.8 % (ref 11.5–15.5)

## 2011-11-12 LAB — COMPREHENSIVE METABOLIC PANEL
Albumin: 3.1 g/dL — ABNORMAL LOW (ref 3.5–5.2)
Alkaline Phosphatase: 52 U/L (ref 39–117)
BUN: 63 mg/dL — ABNORMAL HIGH (ref 6–23)
Calcium: 9.4 mg/dL (ref 8.4–10.5)
Creatinine, Ser: 4.46 mg/dL — ABNORMAL HIGH (ref 0.50–1.35)
GFR calc Af Amer: 13 mL/min — ABNORMAL LOW (ref >90)
Glucose, Bld: 43 mg/dL — ABNORMAL LOW (ref 70–99)
Potassium: 3.1 mEq/L — ABNORMAL LOW (ref 3.5–5.1)
Total Protein: 6.9 g/dL (ref 6.0–8.3)

## 2011-11-12 LAB — GLUCOSE, CAPILLARY
Glucose-Capillary: 53 mg/dL — ABNORMAL LOW (ref 70–99)
Glucose-Capillary: 173 mg/dL — ABNORMAL HIGH (ref 70–99)

## 2011-11-12 MED ORDER — AMLODIPINE BESYLATE 5 MG PO TABS: 10.0000 mg | ORAL_TABLET | Freq: Every day | ORAL | Status: DC

## 2011-11-12 MED ORDER — POTASSIUM CHLORIDE CRYS ER 20 MEQ PO TBCR
20.0000 meq | EXTENDED_RELEASE_TABLET | Freq: Once | ORAL | Status: AC
  Administered 2011-11-12: 20 meq via ORAL
  Filled 2011-11-12: qty 1

## 2011-11-12 MED ORDER — ISOSORBIDE MONONITRATE ER 60 MG PO TB24
60.0000 mg | ORAL_TABLET | Freq: Every day | ORAL | Status: DC
  Administered 2011-11-12: 60 mg via ORAL
  Filled 2011-11-12: qty 1

## 2011-11-12 MED ORDER — ISOSORBIDE MONONITRATE ER 60 MG PO TB24: 60.0000 mg | ORAL_TABLET | Freq: Every day | ORAL | Status: DC

## 2011-11-12 NOTE — Discharge Summary (Signed)
Discharge Summary  Raymond [name] MR#: [id number]  DOB:02-May-1930  Date of Admission: 11/10/2011 Date of Discharge: 11/12/2011  Patient's PCP: Beverley Fiedler, MD  Attending Physician:Kelbi Renstrom  Consults: Cardiology: Dr. Verdis Prime  Discharge Diagnoses: 1. Chest pain  2. 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 Type 2 diabetes mellitus/insulin-dependent 4. Hypertension 5. History of coronary artery disease status post CABG 6. Anemia 7. Leukocytosis  Brief Admitting History and Physical Patient is a 78 year old male with history of coronary artery disease status post CABG, COPD, chronic kidney disease who is being planned for peritoneal dialysis who presented with chest pain which was retrosternal, 5/10 in severity with radiation to the back. This was not associated with dyspnea or palpitations or diaphoresis or nausea vomiting. He was admitted for further evaluation and management.  Discharge Medications Current Discharge Medication List    START taking these medications   Details  isosorbide mononitrate (IMDUR) 60 MG 24 hr tablet Take 1 tablet (60 mg total) by mouth daily. Qty: 30 tablet, Refills: 0      CONTINUE these medications which have CHANGED   Details  amLODipine (NORVASC) 5 MG tablet Take 2 tablets (10 mg total) by mouth daily. Qty: 60 tablet, Refills: 0      CONTINUE these medications which have NOT CHANGED   Details  albuterol (PROVENTIL HFA;VENTOLIN HFA) 108 (90 BASE) MCG/ACT inhaler Inhale 2 puffs into the lungs every 6 (six) hours as needed. For wheeze or shortness of breath      aspirin EC 81 MG tablet Take 81 mg by mouth daily.      doxazosin (CARDURA) 8 MG tablet Take 8 mg by mouth at bedtime.      Fluticasone-Salmeterol (ADVAIR) 100-50 MCG/DOSE AEPB Inhale 1 puff into the lungs every 12 (twelve) hours.      furosemide (LASIX) 40 MG tablet Take 40-80 mg by mouth 2 (two) times daily. 2 in the morning and 1 at night     insulin NPH (HUMULIN  N,NOVOLIN N) 100 UNIT/ML injection Inject 5-25 Units into the skin 2 (two) times daily. Based on sliding scale in the morning and mid-morning    insulin regular (NOVOLIN R,HUMULIN R) 100 units/mL injection Inject 4-14 Units into the skin 3 (three) times daily as needed. If blood sugar is over 120, take 4 units in the morning. If below 120, do not take in the morning. 6-16 units mid-morning along with Humulin N. 4-14 units at bedtime     lipase/protease/amylase (CREON-10/PANCREASE) 12000 UNITS CPEP Take 2 capsules by mouth 3 (three) times daily before meals.      metoprolol (LOPRESSOR) 50 MG tablet Take 50 mg by mouth 2 (two) times daily.      Multiple Vitamins-Minerals (MULTIVITAMINS THER. W/MINERALS) TABS Take 1 tablet by mouth daily.      omeprazole (PRILOSEC) 20 MG capsule Take 20 mg by mouth daily.      OVER THE COUNTER MEDICATION Take 1 tablet by mouth daily. Legatrin PM     paricalcitol (ZEMPLAR) 1 MCG capsule Take 1 mcg by mouth daily.      quiNINE (QUALAQUIN) 324 MG capsule Take 648 mg by mouth every evening.      simvastatin (ZOCOR) 10 MG tablet Take 10 mg by mouth at bedtime.      tiotropium (SPIRIVA) 18 MCG inhalation capsule Place 18 mcg into inhaler and inhale daily.      traMADol (ULTRAM) 50 MG tablet Take 50 mg by mouth every 6 (six) hours as needed. Maximum dose=  8 tablets per day         Hospital Course: 1. Chest pain: Patient was admitted to the hospital. Cardiac enzymes were cycled and were positive for troponin elevation. Cardiology was consulted and continued to see him in the hospital. His chest pain has resolved. Discussed with Dr. Katrinka [name] a short while ago who recommends that from cardiology standpoint patient can be discharged home on medical management. He is at high risk for any kind of cardiac intervention at this time. However if his chest pain persists or worsens then may consider further intervention down the road. His Imdur dose was  increased. 2. Hypokalemia: Carefully replete given his history of chronic kidney disease. Outpatient followup 3. Chronic kidney disease stage 4-5: Discussed with his primary nephrologist who indicates that he is being considered for peritoneal dialysis. He has an appointment to followup with her in a couple of weeks. 4. Type 2 diabetes mellitus: Patient had a hypoglycemic episode. Patient is advised to closely monitor his blood sugars and if he has frequent hypoglycemic episodes which is possible given his worsening renal functions, then his insulin regimen may have to be tapered down. He verbalizes understanding. 5. Hypertension: Reasonably controlled. 6. Anemia: Possibly secondary to chronic kidney disease. Stable   Day of Discharge BP 129/69  Pulse 72  Temp(Src) 97 F (36.1 C) (Oral)  Resp 20  Ht 5\' 9"  (1.753 m)  Wt 79.7 kg (175 lb 11.3 oz)  BMI 25.95 kg/m2  SpO2 92%  General exam: Comfortable Respiratory system: Clear Cardiovascular system: First and second heart sounds heard, regular. No JVD or murmurs. S. intestinal system: Abdomen is nondistended, soft and normal bowel sounds heard. Central nervous system: Alert and oriented. No focal neurological deficits. Extremities: No cyanosis clubbing or edema. A dark speck is present in his web space between the fourth and fifth toe. Unclear if this is a tiny blood clot or scabbed superficial ulcer. This can be followed as an outpatient as deemed necessary.    Results for orders placed during the hospital encounter of 11/10/11 (from the past 48 hour(s))  CARDIAC PANEL(CRET KIN+CKTOT+MB+TROPI)     Status: Abnormal   Collection Time   11/10/11  4:58 PM      Component Value Range Comment   Total CK 74  7 - 232 (U/L)    CK, MB 3.8  0.3 - 4.0 (ng/mL)    Troponin I 0.45 (*) <0.30 (ng/mL)    Relative Index RELATIVE INDEX IS INVALID  0.0 - 2.5    GLUCOSE, CAPILLARY     Status: Abnormal   Collection Time   11/10/11  9:53 PM      Component  Value Range Comment   Glucose-Capillary 348 (*) 70 - 99 (mg/dL)   CARDIAC PANEL(CRET KIN+CKTOT+MB+TROPI)     Status: Abnormal   Collection Time   11/11/11  1:35 AM      Component Value Range Comment   Total CK 67  7 - 232 (U/L)    CK, MB 3.3  0.3 - 4.0 (ng/mL)    Troponin I 0.47 (*) <0.30 (ng/mL)    Relative Index RELATIVE INDEX IS INVALID  0.0 - 2.5    CBC     Status: Abnormal   Collection Time   11/11/11  1:35 AM      Component Value Range Comment   WBC 15.2 (*) 4.0 - 10.5 (K/uL)    RBC 3.76 (*) 4.22 - 5.81 (MIL/uL)    Hemoglobin 11.7 (*) 13.0 - 17.0 (  g/dL)    HCT 91.4 (*) 78.2 - 52.0 (%)    MCV 93.6  78.0 - 100.0 (fL)    MCH 31.1  26.0 - 34.0 (pg)    MCHC 33.2  30.0 - 36.0 (g/dL)    RDW 95.6  21.3 - 08.6 (%)    Platelets 286  150 - 400 (K/uL)   COMPREHENSIVE METABOLIC PANEL     Status: Abnormal   Collection Time   11/11/11  1:35 AM      Component Value Range Comment   Sodium 137  135 - 145 (mEq/L)    Potassium 3.8  3.5 - 5.1 (mEq/L)    Chloride 100  96 - 112 (mEq/L)    CO2 23  19 - 32 (mEq/L)    Glucose, Bld 357 (*) 70 - 99 (mg/dL)    BUN 60 (*) 6 - 23 (mg/dL)    Creatinine, Ser 5.78 (*) 0.50 - 1.35 (mg/dL)    Calcium 9.5  8.4 - 10.5 (mg/dL)    Total Protein 6.9  6.0 - 8.3 (g/dL)    Albumin 3.1 (*) 3.5 - 5.2 (g/dL)    AST 22  0 - 37 (U/L)    ALT 12  0 - 53 (U/L)    Alkaline Phosphatase 54  39 - 117 (U/L)    Total Bilirubin 0.2 (*) 0.3 - 1.2 (mg/dL)    GFR calc non Af Amer 12 (*) >90 (mL/min)    GFR calc Af Amer 14 (*) >90 (mL/min)   GLUCOSE, CAPILLARY     Status: Abnormal   Collection Time   11/11/11  6:04 AM      Component Value Range Comment   Glucose-Capillary 278 (*) 70 - 99 (mg/dL)    Comment 1 Notify RN     GLUCOSE, CAPILLARY     Status: Abnormal   Collection Time   11/11/11 12:02 PM      Component Value Range Comment   Glucose-Capillary 242 (*) 70 - 99 (mg/dL)   GLUCOSE, CAPILLARY     Status: Abnormal   Collection Time   11/11/11  4:49 PM      Component Value  Range Comment   Glucose-Capillary 253 (*) 70 - 99 (mg/dL)   GLUCOSE, CAPILLARY     Status: Abnormal   Collection Time   11/11/11  9:10 PM      Component Value Range Comment   Glucose-Capillary 176 (*) 70 - 99 (mg/dL)   CBC     Status: Abnormal   Collection Time   11/12/11  5:11 AM      Component Value Range Comment   WBC 16.7 (*) 4.0 - 10.5 (K/uL)    RBC 3.67 (*) 4.22 - 5.81 (MIL/uL)    Hemoglobin 11.3 (*) 13.0 - 17.0 (g/dL)    HCT 46.9 (*) 62.9 - 52.0 (%)    MCV 94.0  78.0 - 100.0 (fL)    MCH 30.8  26.0 - 34.0 (pg)    MCHC 32.8  30.0 - 36.0 (g/dL)    RDW 52.8  41.3 - 24.4 (%)    Platelets 265  150 - 400 (K/uL)   COMPREHENSIVE METABOLIC PANEL     Status: Abnormal   Collection Time   11/12/11  5:11 AM      Component Value Range Comment   Sodium 144  135 - 145 (mEq/L) DELTA CHECK NOTED   Potassium 3.1 (*) 3.5 - 5.1 (mEq/L)    Chloride 104  96 - 112 (mEq/L)  CO2 25  19 - 32 (mEq/L)    Glucose, Bld 43 (*) 70 - 99 (mg/dL)    BUN 63 (*) 6 - 23 (mg/dL)    Creatinine, Ser 1.61 (*) 0.50 - 1.35 (mg/dL)    Calcium 9.4  8.4 - 10.5 (mg/dL)    Total Protein 6.9  6.0 - 8.3 (g/dL)    Albumin 3.1 (*) 3.5 - 5.2 (g/dL)    AST 21  0 - 37 (U/L)    ALT 11  0 - 53 (U/L)    Alkaline Phosphatase 52  39 - 117 (U/L)    Total Bilirubin 0.3  0.3 - 1.2 (mg/dL)    GFR calc non Af Amer 11 (*) >90 (mL/min)    GFR calc Af Amer 13 (*) >90 (mL/min)   GLUCOSE, CAPILLARY     Status: Abnormal   Collection Time   11/12/11  6:09 AM      Component Value Range Comment   Glucose-Capillary 53 (*) 70 - 99 (mg/dL)   GLUCOSE, CAPILLARY     Status: Normal   Collection Time   11/12/11  6:50 AM      Component Value Range Comment   Glucose-Capillary 84  70 - 99 (mg/dL)   GLUCOSE, CAPILLARY     Status: Abnormal   Collection Time   11/12/11 11:38 AM      Component Value Range Comment   Glucose-Capillary 173 (*) 70 - 99 (mg/dL)    Comment 1 Documented in Chart      Comment 2 Notify RN       Dg Chest Portable 1  View  11/10/2011  *RADIOLOGY REPORT*  Clinical Data: Chest pain.  PORTABLE CHEST - 1 VIEW  Comparison: 10/25/2009  Findings: Superimposed on underlying COPD is increased vascular prominence and interstitial prominence suggestive of interstitial edema.  The heart size is stable status post prior CABG.  No pleural effusions or areas of pulmonary consolidation are identified.  IMPRESSION: COPD and interstitial edema.  Original Report Authenticated By: Reola Calkins, M.D.     Disposition:  discharged home in stable condition.  Diet:  heart healthy and diabetic  Activity:  Increase activity gradually   Follow-up Appts: Discharge Orders    Future Orders Please Complete By Expires   Diet - low sodium heart healthy      Diet Carb Modified      Increase activity slowly      Call MD for:  severe uncontrolled pain      Call MD for:  difficulty breathing, headache or visual disturbances      (HEART FAILURE PATIENTS) Call MD:  Anytime you have any of the following symptoms: 1) 3 pound weight gain in 24 hours or 5 pounds in 1 week 2) shortness of breath, with or without a dry hacking cough 3) swelling in the hands, feet or stomach 4) if you have to sleep on extra pillows at night in order to breathe.         TESTS THAT NEED FOLLOW-UP  BMP when he sees his cardiologist/nephrologist   Time spent on discharge, talking to the patient, and coordinating care:  30  mins.   SignedMarcellus Scott, MD 11/12/2011, 2:43 PM

## 2011-11-12 NOTE — Progress Notes (Signed)
Patient Name: Raymond [name] Date of Encounter: 11/12/2011    SUBJECTIVE: No recurrence of chest pain.  TELEMETRY:  NSR: Filed Vitals:   11/11/11 0500 11/11/11 1407 11/11/11 2117 11/12/11 0435  BP: 127/75 121/68 126/70 129/69  Pulse: 86 71 74 72  Temp: 97.8 F (36.6 C) 98.5 F (36.9 C) 98.2 F (36.8 C) 97 F (36.1 C)  TempSrc: Oral Oral Oral Oral  Resp: 17 18 18 20   Height:      Weight:      SpO2: 97% 98% 95% 90%    Intake/Output Summary (Last 24 hours) at 11/12/11 0807 Last data filed at 11/12/11 0437  Gross per 24 hour  Intake   1200 ml  Output   1410 ml  Net   -210 ml    LABS: Basic Metabolic Panel:  Basename 11/12/11 0511 11/11/11 0135 11/10/11 1227  NA 144 137 --  K 3.1* 3.8 --  CL 104 100 --  CO2 25 23 --  GLUCOSE 43* 357* --  BUN 63* 60* --  CREATININE 4.46* 4.17* --  CALCIUM 9.4 9.5 --  MG -- -- 2.1  PHOS -- -- --   CBC:  Basename 11/12/11 0511 11/11/11 0135 11/10/11 1227  WBC 16.7* 15.2* --  NEUTROABS -- -- 14.7*  HGB 11.3* 11.7* --  HCT 34.5* 35.2* --  MCV 94.0 93.6 --  PLT 265 286 --   Cardiac Enzymes:  Basename 11/11/11 0135 11/10/11 1658 11/10/11 1003  CKTOTAL 67 74 62  CKMB 3.3 3.8 2.6  CKMBINDEX -- -- --  TROPONINI 0.47* 0.45* <0.30   Radiology/Studies:  Interstitial edema on CXR  Physical Exam: Blood pressure 129/69, pulse 72, temperature 97 F (36.1 C), temperature source Oral, resp. rate 20, height 5\' 9"  (1.753 m), weight 79.7 kg (175 lb 11.3 oz), SpO2 90.00%. Weight change:    Decreased breath sounds  ASSESSMENT:  1. Chest pain, probably ischemic.  2. Severe COPD  3. Hypokalemia   Plan:  1. Further increase Imdur  2. No ischemic eval planned, since the risk of doing invasive therapy and / or surgery currently seem to outweigh the benefit. If symptoms recur, I will reconsider.  3. Treat hypokalemia  Signed, Lesleigh Noe 11/12/2011, 8:07 AM

## 2011-11-12 NOTE — Progress Notes (Signed)
Pt being discharged home with family.  All DC instructions and prescriptions given and reviewed, Pt verbalizes understanding.

## 2011-11-16 LAB — CULTURE, BLOOD (ROUTINE X 2)
Culture  Setup Time: 201301012218
Culture: NO GROWTH
Culture: NO GROWTH

## 2012-08-18 ENCOUNTER — Emergency Department (HOSPITAL_COMMUNITY): Payer: Managed Care, Other (non HMO)

## 2012-08-18 ENCOUNTER — Observation Stay (HOSPITAL_COMMUNITY)
Admission: EM | Admit: 2012-08-18 | Discharge: 2012-08-20 | Disposition: A | Discharge status: Home / Self Care | Payer: Managed Care, Other (non HMO) | Attending: Internal Medicine | Admitting: Internal Medicine

## 2012-08-18 ENCOUNTER — Encounter (HOSPITAL_COMMUNITY): Payer: Self-pay | Admitting: *Deleted

## 2012-08-18 DIAGNOSIS — N390 Urinary tract infection, site not specified: Principal | ICD-10-CM | POA: Insufficient documentation

## 2012-08-18 DIAGNOSIS — I10 Essential (primary) hypertension: Secondary | ICD-10-CM | POA: Diagnosis present

## 2012-08-18 DIAGNOSIS — J4489 Other specified chronic obstructive pulmonary disease: Secondary | ICD-10-CM | POA: Insufficient documentation

## 2012-08-18 DIAGNOSIS — Z79899 Other long term (current) drug therapy: Secondary | ICD-10-CM | POA: Insufficient documentation

## 2012-08-18 DIAGNOSIS — N184 Chronic kidney disease, stage 4 (severe): Secondary | ICD-10-CM | POA: Insufficient documentation

## 2012-08-18 DIAGNOSIS — R42 Dizziness and giddiness: Secondary | ICD-10-CM

## 2012-08-18 DIAGNOSIS — E119 Type 2 diabetes mellitus without complications: Secondary | ICD-10-CM | POA: Diagnosis present

## 2012-08-18 DIAGNOSIS — I129 Hypertensive chronic kidney disease with stage 1 through stage 4 chronic kidney disease, or unspecified chronic kidney disease: Secondary | ICD-10-CM | POA: Insufficient documentation

## 2012-08-18 DIAGNOSIS — I951 Orthostatic hypotension: Secondary | ICD-10-CM | POA: Insufficient documentation

## 2012-08-18 DIAGNOSIS — Z951 Presence of aortocoronary bypass graft: Secondary | ICD-10-CM | POA: Insufficient documentation

## 2012-08-18 DIAGNOSIS — I2510 Atherosclerotic heart disease of native coronary artery without angina pectoris: Secondary | ICD-10-CM | POA: Insufficient documentation

## 2012-08-18 DIAGNOSIS — J449 Chronic obstructive pulmonary disease, unspecified: Secondary | ICD-10-CM

## 2012-08-18 DIAGNOSIS — J439 Emphysema, unspecified: Secondary | ICD-10-CM | POA: Diagnosis present

## 2012-08-18 DIAGNOSIS — Z794 Long term (current) use of insulin: Secondary | ICD-10-CM | POA: Insufficient documentation

## 2012-08-18 DIAGNOSIS — E785 Hyperlipidemia, unspecified: Secondary | ICD-10-CM | POA: Insufficient documentation

## 2012-08-18 DIAGNOSIS — Z7982 Long term (current) use of aspirin: Secondary | ICD-10-CM | POA: Insufficient documentation

## 2012-08-18 DIAGNOSIS — R079 Chest pain, unspecified: Secondary | ICD-10-CM

## 2012-08-18 HISTORY — DX: Chest pain, unspecified: R07.9

## 2012-08-18 HISTORY — DX: Malignant neoplasm of colon, unspecified: C18.9

## 2012-08-18 HISTORY — DX: Pneumonia, unspecified organism: J18.9

## 2012-08-18 HISTORY — DX: Angina pectoris, unspecified: I20.9

## 2012-08-18 HISTORY — DX: Personal history of other diseases of the digestive system: Z87.19

## 2012-08-18 HISTORY — DX: Type 2 diabetes mellitus without complications: E11.9

## 2012-08-18 HISTORY — DX: Shortness of breath: R06.02

## 2012-08-18 HISTORY — DX: Chronic kidney disease, stage 4 (severe): N18.4

## 2012-08-18 HISTORY — DX: Unspecified osteoarthritis, unspecified site: M19.90

## 2012-08-18 HISTORY — DX: Pure hypercholesterolemia, unspecified: E78.00

## 2012-08-18 HISTORY — DX: Personal history of other medical treatment: Z92.89

## 2012-08-18 LAB — URINALYSIS, ROUTINE W REFLEX MICROSCOPIC
Bilirubin Urine: NEGATIVE
Glucose, UA: NEGATIVE mg/dL
Ketones, ur: NEGATIVE mg/dL
Protein, ur: 30 mg/dL — AB
pH: 6 (ref 5.0–8.0)

## 2012-08-18 LAB — CBC WITH DIFFERENTIAL/PLATELET
Basophils Relative: 0 % (ref 0–1)
Eosinophils Absolute: 0.4 10*3/uL (ref 0.0–0.7)
Eosinophils Relative: 3 % (ref 0–5)
Lymphs Abs: 1.2 10*3/uL (ref 0.7–4.0)
MCH: 31.5 pg (ref 26.0–34.0)
MCHC: 33.4 g/dL (ref 30.0–36.0)
MCV: 94.3 fL (ref 78.0–100.0)
Monocytes Relative: 9 % (ref 3–12)
Platelets: 255 10*3/uL (ref 150–400)
RBC: 4 MIL/uL — ABNORMAL LOW (ref 4.22–5.81)

## 2012-08-18 LAB — TROPONIN I: Troponin I: <0.3 ng/mL (ref <0.30)

## 2012-08-18 LAB — URINE MICROSCOPIC-ADD ON

## 2012-08-18 LAB — COMPREHENSIVE METABOLIC PANEL
ALT: 10 U/L (ref 0–53)
AST: 20 U/L (ref 0–37)
Alkaline Phosphatase: 57 U/L (ref 39–117)
CO2: 25 mEq/L (ref 19–32)
Chloride: 103 mEq/L (ref 96–112)
Creatinine, Ser: 4.63 mg/dL — ABNORMAL HIGH (ref 0.50–1.35)
GFR calc non Af Amer: 11 mL/min — ABNORMAL LOW (ref >90)
Total Bilirubin: 0.2 mg/dL — ABNORMAL LOW (ref 0.3–1.2)

## 2012-08-18 LAB — CBC
Hemoglobin: 12 g/dL — ABNORMAL LOW (ref 13.0–17.0)
MCH: 31 pg (ref 26.0–34.0)
MCV: 93.8 fL (ref 78.0–100.0)
Platelets: 241 10*3/uL (ref 150–400)
RBC: 3.87 MIL/uL — ABNORMAL LOW (ref 4.22–5.81)

## 2012-08-18 MED ORDER — FLUTICASONE-SALMETEROL 100-50 MCG/DOSE IN AEPB
1.0000 | INHALATION_SPRAY | Freq: Two times a day (BID) | RESPIRATORY_TRACT | Status: DC
  Administered 2012-08-18 – 2012-08-20 (×4): 1 via RESPIRATORY_TRACT
  Filled 2012-08-18: qty 14

## 2012-08-18 MED ORDER — METOPROLOL TARTRATE 50 MG PO TABS
50.0000 mg | ORAL_TABLET | Freq: Two times a day (BID) | ORAL | Status: DC
  Administered 2012-08-18 – 2012-08-20 (×4): 50 mg via ORAL
  Filled 2012-08-18 (×5): qty 1

## 2012-08-18 MED ORDER — ONDANSETRON HCL 4 MG/2ML IJ SOLN: 4.0000 mg | Freq: Three times a day (TID) | INTRAMUSCULAR | Status: DC | PRN

## 2012-08-18 MED ORDER — ADULT MULTIVITAMIN W/MINERALS CH
1.0000 | ORAL_TABLET | Freq: Every day | ORAL | Status: DC
  Administered 2012-08-19 – 2012-08-20 (×2): 1 via ORAL
  Filled 2012-08-18 (×3): qty 1

## 2012-08-18 MED ORDER — FUROSEMIDE 80 MG PO TABS
80.0000 mg | ORAL_TABLET | Freq: Every morning | ORAL | Status: DC
  Administered 2012-08-19 – 2012-08-20 (×2): 80 mg via ORAL
  Filled 2012-08-18 (×2): qty 1

## 2012-08-18 MED ORDER — SODIUM CHLORIDE 0.9 % IV BOLUS (SEPSIS)
500.0000 mL | Freq: Once | INTRAVENOUS | Status: AC
  Administered 2012-08-18: 1000 mL via INTRAVENOUS

## 2012-08-18 MED ORDER — PARICALCITOL 1 MCG PO CAPS
1.0000 ug | ORAL_CAPSULE | Freq: Every day | ORAL | Status: DC
  Filled 2012-08-18 (×2): qty 1

## 2012-08-18 MED ORDER — ACETAMINOPHEN 325 MG PO TABS: 650.0000 mg | ORAL_TABLET | Freq: Four times a day (QID) | ORAL | Status: DC | PRN

## 2012-08-18 MED ORDER — ACETAMINOPHEN 650 MG RE SUPP: 650.0000 mg | Freq: Four times a day (QID) | RECTAL | Status: DC | PRN

## 2012-08-18 MED ORDER — SODIUM CHLORIDE 0.9 % IV SOLN
INTRAVENOUS | Status: AC
  Administered 2012-08-18: 18:00:00 via INTRAVENOUS

## 2012-08-18 MED ORDER — DOXAZOSIN MESYLATE 8 MG PO TABS
8.0000 mg | ORAL_TABLET | Freq: Every day | ORAL | Status: DC
  Administered 2012-08-18 – 2012-08-19 (×2): 8 mg via ORAL
  Filled 2012-08-18 (×3): qty 1

## 2012-08-18 MED ORDER — INSULIN ASPART 100 UNIT/ML ~~LOC~~ SOLN
0.0000 [IU] | Freq: Three times a day (TID) | SUBCUTANEOUS | Status: DC
  Administered 2012-08-19: 3 [IU] via SUBCUTANEOUS
  Administered 2012-08-19: 2 [IU] via SUBCUTANEOUS
  Administered 2012-08-19: 3 [IU] via SUBCUTANEOUS
  Administered 2012-08-20 (×2): 5 [IU] via SUBCUTANEOUS

## 2012-08-18 MED ORDER — FUROSEMIDE 40 MG PO TABS
40.0000 mg | ORAL_TABLET | Freq: Every evening | ORAL | Status: DC
  Administered 2012-08-18 – 2012-08-19 (×2): 40 mg via ORAL
  Filled 2012-08-18 (×3): qty 1

## 2012-08-18 MED ORDER — AMLODIPINE BESYLATE 10 MG PO TABS
10.0000 mg | ORAL_TABLET | Freq: Every day | ORAL | Status: DC
  Administered 2012-08-19 – 2012-08-20 (×2): 10 mg via ORAL
  Filled 2012-08-18 (×2): qty 1

## 2012-08-18 MED ORDER — ISOSORBIDE MONONITRATE ER 60 MG PO TB24
60.0000 mg | ORAL_TABLET | Freq: Every day | ORAL | Status: DC
  Administered 2012-08-19 – 2012-08-20 (×2): 60 mg via ORAL
  Filled 2012-08-18 (×3): qty 1

## 2012-08-18 MED ORDER — SIMVASTATIN 10 MG PO TABS
10.0000 mg | ORAL_TABLET | Freq: Every day | ORAL | Status: DC
  Administered 2012-08-18 – 2012-08-19 (×2): 10 mg via ORAL
  Filled 2012-08-18 (×3): qty 1

## 2012-08-18 MED ORDER — DOCUSATE SODIUM 100 MG PO CAPS
100.0000 mg | ORAL_CAPSULE | Freq: Every day | ORAL | Status: DC
  Administered 2012-08-18 – 2012-08-20 (×3): 100 mg via ORAL
  Filled 2012-08-18 (×3): qty 1

## 2012-08-18 MED ORDER — ONDANSETRON HCL 4 MG PO TABS: 4.0000 mg | ORAL_TABLET | Freq: Four times a day (QID) | ORAL | Status: DC | PRN

## 2012-08-18 MED ORDER — HEPARIN SODIUM (PORCINE) 5000 UNIT/ML IJ SOLN
5000.0000 [IU] | Freq: Three times a day (TID) | INTRAMUSCULAR | Status: DC
  Administered 2012-08-18 – 2012-08-20 (×5): 5000 [IU] via SUBCUTANEOUS
  Filled 2012-08-18 (×8): qty 1

## 2012-08-18 MED ORDER — ALBUTEROL SULFATE HFA 108 (90 BASE) MCG/ACT IN AERS
2.0000 | INHALATION_SPRAY | Freq: Four times a day (QID) | RESPIRATORY_TRACT | Status: DC | PRN
  Filled 2012-08-18: qty 6.7

## 2012-08-18 MED ORDER — PANCRELIPASE (LIP-PROT-AMYL) 12000-38000 UNITS PO CPEP
2.0000 | ORAL_CAPSULE | Freq: Three times a day (TID) | ORAL | Status: DC
  Administered 2012-08-19 – 2012-08-20 (×5): 2 via ORAL
  Filled 2012-08-18 (×7): qty 2

## 2012-08-18 MED ORDER — ONDANSETRON HCL 4 MG/2ML IJ SOLN: 4.0000 mg | Freq: Four times a day (QID) | INTRAMUSCULAR | Status: DC | PRN

## 2012-08-18 MED ORDER — SODIUM CHLORIDE 0.9 % IJ SOLN
3.0000 mL | Freq: Two times a day (BID) | INTRAMUSCULAR | Status: DC
  Administered 2012-08-20: 3 mL via INTRAVENOUS

## 2012-08-18 MED ORDER — PANTOPRAZOLE SODIUM 40 MG PO TBEC
40.0000 mg | DELAYED_RELEASE_TABLET | Freq: Every day | ORAL | Status: DC
  Administered 2012-08-19 – 2012-08-20 (×2): 40 mg via ORAL
  Filled 2012-08-18 (×2): qty 1

## 2012-08-18 MED ORDER — QUININE SULFATE 324 MG PO CAPS
648.0000 mg | ORAL_CAPSULE | Freq: Every evening | ORAL | Status: DC
  Administered 2012-08-18 – 2012-08-19 (×2): 648 mg via ORAL
  Filled 2012-08-18 (×3): qty 2

## 2012-08-18 MED ORDER — TIOTROPIUM BROMIDE MONOHYDRATE 18 MCG IN CAPS
18.0000 ug | ORAL_CAPSULE | Freq: Every day | RESPIRATORY_TRACT | Status: DC
  Administered 2012-08-19 – 2012-08-20 (×2): 18 ug via RESPIRATORY_TRACT
  Filled 2012-08-18: qty 5

## 2012-08-18 MED ORDER — ASPIRIN EC 81 MG PO TBEC
81.0000 mg | DELAYED_RELEASE_TABLET | Freq: Every day | ORAL | Status: DC
  Administered 2012-08-19 – 2012-08-20 (×2): 81 mg via ORAL
  Filled 2012-08-18 (×2): qty 1

## 2012-08-18 MED ORDER — DEXTROSE 5 % IV SOLN
1.0000 g | Freq: Once | INTRAVENOUS | Status: AC
  Administered 2012-08-18: 1 g via INTRAVENOUS
  Filled 2012-08-18: qty 10

## 2012-08-18 MED ORDER — DEXTROSE 5 % IV SOLN
1.0000 g | INTRAVENOUS | Status: DC
  Administered 2012-08-19: 1 g via INTRAVENOUS
  Filled 2012-08-18 (×2): qty 10

## 2012-08-18 MED ORDER — FUROSEMIDE 40 MG PO TABS: 40.0000 mg | ORAL_TABLET | Freq: Two times a day (BID) | ORAL | Status: DC

## 2012-08-18 NOTE — ED Notes (Signed)
MD at bedside. 

## 2012-08-18 NOTE — ED Notes (Signed)
Resting with eyes closed no distress noted.  Resp symmetrical and unlabored.

## 2012-08-18 NOTE — ED Notes (Signed)
Raymond [name] is a 78 y.o. male  Evaluation Date: 08/18/2012 Referring Physician:  Barton [name], MD Primary Cardiologist:: HWBSmith, III, MD Chief complaint : Left subclavicular pain  HPI: The patient is well known to me. He has a history of coronary artery disease with prior coronary bypass surgery, severe COPD, stage IV chronic kidney disease, diabetes, and hypertension. He came to the emergency room today after developing a mild left subclavicular discomfort simultaneously associated with a feeling of dizziness. The chest discomfort did not feel like his typical angina. Nevertheless he tried 2 nitroglycerin tablets. At the time of my evaluation he is pain free and possible in his bed. He has been seen by the emergency room physicians who have documented orthostasis and hypotension. He also is felt to have a urinary tract infection.    PMH:    Past Medical History  Diagnosis Date  . COPD (chronic obstructive pulmonary disease)   . Hypertension   . Renal disorder   . Diabetes mellitus   . CAD (coronary artery disease), autologous vein bypass graft     PSH:    Past Surgical History  Procedure Date  . Coronary artery bypass graft   . Hernia repair     inguinal  . Vascular surgery     ALLERGIES:   Codeine and Dilaudid  Prior to Admit Meds:   (Not in a hospital admission) Family HX:   No family history on file. Social HX:    History   Social History  . Marital Status: Married    Spouse Name: N/A    Number of Children: N/A  . Years of Education: N/A   Occupational History  . Not on file.   Social History Main Topics  . Smoking status: Former Smoker -- 2.0 packs/day    Types: Cigarettes    Quit date: 11/09/1982  . Smokeless tobacco: Former Neurosurgeon    Quit date: 11/09/1982  . Alcohol Use: No  . Drug Use: No  . Sexually Active:    Other Topics Concern  . Not on file   Social History Narrative  . No narrative on file     ROS: He denies memory loss,  headache, pleurisy, fever, chills, orthopnea, syncope, and focal weakness.  Physical Exam: Blood pressure 108/58, pulse 82, temperature 97.6 F (36.4 C), temperature source Oral, resp. rate 19, height 5\' 9"  (1.753 m), weight 81.647 kg (180 lb), SpO2 91.00%.    The patient is chronically ill-appearing but in no distress. His skin is warm and dry.  No JVD is noted.  Lungs are clear auscultation and percussion. Breath sounds are distant.  Cardiac sounds are distant. No obvious murmur is heard.  Abdomen is soft. Bowel sounds are normal.  Musculoskeletal exam does not reveal any left shoulder a subclavicular tenderness.  The patient is neurologically intact.   Labs:   Lab Results  Component Value Date   WBC 13.5* 08/18/2012   HGB 12.6* 08/18/2012   HCT 37.7* 08/18/2012   MCV 94.3 08/18/2012   PLT 255 08/18/2012    Lab 08/18/12 1320  NA 143  K 3.7  CL 103  CO2 25  BUN 62*  CREATININE 4.63*  CALCIUM 9.7  PROT 7.0  BILITOT 0.2*  ALKPHOS 57  ALT 10  AST 20  GLUCOSE 67*   Lab Results  Component Value Date   CKTOTAL 67 11/11/2011   CKMB 3.3 11/11/2011   TROPONINI <0.30 08/18/2012      Radiology:  Study Result     *  RADIOLOGY REPORT*  Clinical Data: Left upper chest pain, shortness of breath,  dizziness  CHEST - 2 VIEW  Comparison: 11/10/2011  Findings: Evidence of CABG again noted. Lungs are hyperaerated,  which may be seen with emphysema. Diffusely increased interstitial  lung markings noted, without focal pulmonary opacity. No pleural  effusion. Heart size at upper limits of normal. No acute osseous  finding.  IMPRESSION:  Mild emphysematous changes without acute cardiopulmonary process.  Original Report Authenticated By: Harrel Lemon, M.D.     EKG:  Normal sinus rhythm, left anterior hemiblock, poor R wave progression.  ASSESSMENT:   1. Apparent noncardiac chest pain.  2. Probable urinary tract infection  3. Hypotension likely related to volume  contraction.  Plan:  1. I do not believe that there are acute cardiac issues at play. The patient is not require hospitalization or specific cardiac measures. Have spoken to the emergency room physician/attending concerning my thoughts. He should followup with his primary care physician. Lesleigh Noe 08/18/2012 4:09 PM

## 2012-08-18 NOTE — H&P (Signed)
PATIENT DETAILS Name: Raymond [name] Age: 78 y.o. Sex: male Date of Birth: 1930-01-10 Admit Date: 08/18/2012 ZOX:[id number], MD   CHIEF COMPLAINT:  Left-sided chest pain  HPI: Patient is a 78 year old Caucasian male with a past medical history of coronary artery disease status post CABG, stage IV chronic kidney disease, diabetes, hypertension who presented to the ED for evaluation of the above-noted complaints. The patient for the past 3 days he has been having intermittent left-sided chest pain. The patient this pain is not like his typical anginal pain, there is no associated shortness of breath or diaphoresis. He also claimed that for the past 2 days he has been feeling lightheaded, particularly on standing and walking. He denies any syncopal events. In the ED he was evaluated in part to have orthostatic hypotension and also felt to have a urinary tract infection. Because of his significant medical comorbidities I was asked to admit this patient for further evaluation and treatment. Patient denies any fever, headache, nausea vomiting or diarrhea. There is no history of abdominal pain as well.   ALLERGIES:   Allergies  Allergen Reactions  . Codeine     nausea  . Dilaudid (Hydromorphone Hcl)     diaphoresis    PAST MEDICAL HISTORY: Past Medical History  Diagnosis Date  . COPD (chronic obstructive pulmonary disease)   . Hypertension   . Renal disorder   . Diabetes mellitus   . CAD (coronary artery disease), autologous vein bypass graft     PAST SURGICAL HISTORY: Past Surgical History  Procedure Date  . Coronary artery bypass graft   . Hernia repair     inguinal  . Vascular surgery     MEDICATIONS AT HOME: Prior to Admission medications   Medication Sig Start Date End Date Taking? Authorizing Provider  albuterol (PROVENTIL HFA;VENTOLIN HFA) 108 (90 BASE) MCG/ACT inhaler Inhale 2 puffs into the lungs every 6 (six) hours as needed. For wheeze or shortness of  breath     Yes Historical Provider, MD  amLODipine (NORVASC) 5 MG tablet Take 2 tablets (10 mg total) by mouth daily. 11/12/11  Yes Elease Etienne, MD  aspirin EC 81 MG tablet Take 81 mg by mouth daily.     Yes Historical Provider, MD  docusate sodium (COLACE) 100 MG capsule Take 100 mg by mouth daily.   Yes Historical Provider, MD  doxazosin (CARDURA) 8 MG tablet Take 8 mg by mouth at bedtime.     Yes Historical Provider, MD  Fluticasone-Salmeterol (ADVAIR) 100-50 MCG/DOSE AEPB Inhale 1 puff into the lungs every 12 (twelve) hours.     Yes Historical Provider, MD  furosemide (LASIX) 40 MG tablet Take 40-80 mg by mouth 2 (two) times daily. 2 in the morning and 1 at night    Yes Historical Provider, MD  insulin NPH (HUMULIN N,NOVOLIN N) 100 UNIT/ML injection Inject 5-25 Units into the skin 3 (three) times daily with meals. Based on sliding scale in the morning and mid-morning   Yes Historical Provider, MD  insulin regular (NOVOLIN R,HUMULIN R) 100 units/mL injection Inject 4-14 Units into the skin 3 (three) times daily as needed. If blood sugar is over 120, take 4 units in the morning. If below 120, do not take in the morning. 6-16 units mid-morning along with Humulin N. 4-14 units at bedtime    Yes Historical Provider, MD  isosorbide mononitrate (IMDUR) 60 MG 24 hr tablet Take 1 tablet (60 mg total) by mouth daily. 11/12/11 11/11/12 Yes Theadora Rama D  Hongalgi, MD  lipase/protease/amylase (CREON-10/PANCREASE) 12000 UNITS CPEP Take 2 capsules by mouth 3 (three) times daily before meals.     Yes Historical Provider, MD  metoprolol (LOPRESSOR) 50 MG tablet Take 50 mg by mouth 2 (two) times daily.     Yes Historical Provider, MD  Multiple Vitamins-Minerals (MULTIVITAMINS THER. W/MINERALS) TABS Take 1 tablet by mouth daily.     Yes Historical Provider, MD  omeprazole (PRILOSEC) 20 MG capsule Take 20 mg by mouth daily.     Yes Historical Provider, MD  OVER THE COUNTER MEDICATION Take 1 tablet by mouth daily. Legatrin  PM    Yes Historical Provider, MD  paricalcitol (ZEMPLAR) 1 MCG capsule Take 1 mcg by mouth daily.     Yes Historical Provider, MD  quiNINE (QUALAQUIN) 324 MG capsule Take 648 mg by mouth every evening.     Yes Historical Provider, MD  simvastatin (ZOCOR) 10 MG tablet Take 10 mg by mouth at bedtime.     Yes Historical Provider, MD  tiotropium (SPIRIVA) 18 MCG inhalation capsule Place 18 mcg into inhaler and inhale daily.     Yes Historical Provider, MD  traMADol (ULTRAM) 50 MG tablet Take 50 mg by mouth every 6 (six) hours as needed. Maximum dose= 8 tablets per day    Yes Historical Provider, MD    FAMILY HISTORY: Father had CHF, a brother has prostate cancer  SOCIAL HISTORY:  reports that he quit smoking about 29 years ago. His smoking use included Cigarettes. He smoked 2 packs per day. He quit smokeless tobacco use about 29 years ago. He reports that he does not drink alcohol or use illicit drugs.  REVIEW OF SYSTEMS:  Constitutional:   No  weight loss, night sweats,  Fevers, chills, fatigue.  HEENT:    No headaches, Difficulty swallowing,Tooth/dental problems,Sore throat,  No sneezing, itching, ear ache, nasal congestion, post nasal drip,   Cardio-vascular:  Orthopnea, PND, swelling in lower extremities, anasarca,  dizziness, palpitations  GI:  No heartburn, indigestion, abdominal pain, nausea, vomiting, diarrhea, change in  bowel habits, loss of appetite  Resp: No shortness of breath with exertion or at rest.  No excess mucus, no productive cough, No non-productive cough,  No coughing up of blood.No change in color of mucus.No wheezing.No chest wall deformity  Skin:  no rash or lesions.  GU:  no dysuria, change in color of urine, no urgency or frequency.  No flank pain.  Musculoskeletal: No joint pain or swelling.  No decreased range of motion.  No back pain.  Psych: No change in mood or affect. No depression or anxiety.  No memory loss.   PHYSICAL EXAM: Blood pressure  117/67, pulse 80, temperature 97.6 F (36.4 C), temperature source Oral, resp. rate 16, height 5\' 9"  (1.753 m), weight 81.647 kg (180 lb), SpO2 92.00%.  General appearance :Awake, alert, not in any distress. Speech Clear. Not toxic Looking HEENT: Atraumatic and Normocephalic, pupils equally reactive to light and accomodation Neck: supple, no JVD. No cervical lymphadenopathy.  Chest:Good air entry bilaterally, no added sounds  CVS: S1 S2 regular, no murmurs.  Abdomen: Bowel sounds present, Non tender and not distended with no gaurding, rigidity or rebound. Extremities: B/L Lower Ext shows no edema, both legs are warm to touch, with  dorsalis pedis pulses palpable. Neurology: Awake alert, and oriented X 3, CN II-XII intact Skin:No Rash Wounds:N/A  LABS ON ADMISSION:   Basename 08/18/12 1320  NA 143  K 3.7  CL 103  CO2  25  GLUCOSE 67*  BUN 62*  CREATININE 4.63*  CALCIUM 9.7  MG --  PHOS --    Basename 08/18/12 1320  AST 20  ALT 10  ALKPHOS 57  BILITOT 0.2*  PROT 7.0  ALBUMIN 3.4*   No results found for this basename: LIPASE:2,AMYLASE:2 in the last 72 hours  Basename 08/18/12 1320  WBC 13.5*  NEUTROABS 10.7*  HGB 12.6*  HCT 37.7*  MCV 94.3  PLT 255    Basename 08/18/12 1321  CKTOTAL --  CKMB --  CKMBINDEX --  TROPONINI <0.30   No results found for this basename: DDIMER:2 in the last 72 hours No components found with this basename: POCBNP:3   RADIOLOGIC STUDIES ON ADMISSION: Dg Chest 2 View  08/18/2012  *RADIOLOGY REPORT*  Clinical Data: Left upper chest pain, shortness of breath, dizziness  CHEST - 2 VIEW  Comparison: 11/10/2011  Findings: Evidence of CABG again noted.  Lungs are hyperaerated, which may be seen with emphysema. Diffusely increased interstitial lung markings noted, without focal pulmonary opacity.  No pleural effusion.  Heart size at upper limits of normal.  No acute osseous finding.  IMPRESSION: Mild emphysematous changes without acute  cardiopulmonary process.   Original Report Authenticated By: Harrel Lemon, M.D.     ASSESSMENT AND PLAN: Present on Admission:  .Chest pain -Also he has a history of coronary artery disease, this pain does not sound like cardiac. He has also been evaluated by Dr. Katrinka [name] from the cardiology service. -He'll be admitted to telemetry unit and cardiac enzymes were cycled. Aspirin will be continued.   Marland KitchenUTI (lower urinary tract infection) -We'll start on Rocephin and obtain a urine culture.   . Orthostatic hypotension  -Likely a combination of the patient being on diuretic and UTI, gently hydrate overnight. Reassess in a.m.  -This is the likely cause of the patient's lightheadedness. -Get PT.  Marland KitchenCAD (coronary artery disease) -Continue with aspirin, Lopressor and statins.  -See above for further details.   Marland KitchenHTN (hypertension) -Currently seems stable, continue with metoprolol, Imdur, Lasix and amlodipine. Adjust medications depending on BP readings.   .DM (diabetes mellitus) -This on sliding scale insulin while an inpatient. Just medications depending on CBG readings.   Marland KitchenCOPD (chronic obstructive pulmonary disease) -Lungs are clear, as needed bronchodilators. Continue with Spiriva and Advair.   .CKD (chronic kidney disease) stage 4, GFR 15-29 ml/min -Known stage IV chronic kidney disease, follows up with Dr. Kathrene Bongo as an outpatient. Current creatinine is close to usual baseline, we'll recheck electrolytes in the morning.   Marland KitchenDyslipidemia -Continue statins  Further plan will depend as patient's clinical course evolves and further radiologic and laboratory data become available. Patient will be monitored closely.  DVT Prophylaxis: -Prophylactic heparin.  Code Status: -Full code  Total time spent for admission equals 45 minutes.  [facility] Pager [phone]  If 7PM-7AM, please contact night-coverage [email or web address] Password [email or web address] 08/18/2012,  5:35 PM

## 2012-08-18 NOTE — ED Notes (Signed)
Per report pt for the last 2 - 3 days has had intermittent Cp dull pain L chest.   The pain was associated with dizziness, and light headedness.  The difference today was that the pain did not subside.  He contacted EMS and on their arrival pt was reported to have been orthostatic from going from a lying to a sitting position his BP reportedly dropped from the systolic 100's to a systolic of 60 with the same complaint and associated symptoms of light headedness.  He administered himself ASA 324 mg po x 2 today.  Prior to EMS loading him onto their stretcher pt became pain free and is pain free on arrival.

## 2012-08-18 NOTE — ED Provider Notes (Signed)
History     CSN: [id number]  Arrival date & time 08/18/12  1305   First MD Initiated Contact with Patient 08/18/12 1311      Chief Complaint  Patient presents with  . Chest Pain    (Consider location/radiation/quality/duration/timing/severity/associated sxs/prior treatment) HPI Pt with L sided pressure type chest pain starting around 1100 today while sitting and watching tv. Pt states pain was associated with lightheadedness especially when changing position. States he took aspirin x 2 and NTG x 2. Pain remitted just before EMS arrived. EMS noted change in BP when changing positions. Pt states he is now symptom-free.   Past Medical History  Diagnosis Date  . COPD (chronic obstructive pulmonary disease)   . Hypertension   . Renal disorder   . Diabetes mellitus   . CAD (coronary artery disease), autologous vein bypass graft     Past Surgical History  Procedure Date  . Coronary artery bypass graft   . Hernia repair     inguinal  . Vascular surgery     No family history on file.  History  Substance Use Topics  . Smoking status: Former Smoker -- 2.0 packs/day    Types: Cigarettes    Quit date: 11/09/1982  . Smokeless tobacco: Former Neurosurgeon    Quit date: 11/09/1982  . Alcohol Use: No      Review of Systems  Constitutional: Negative for fever and chills.  Respiratory: Negative for cough and shortness of breath.   Cardiovascular: Positive for chest pain. Negative for palpitations and leg swelling.  Gastrointestinal: Negative for nausea, vomiting, abdominal pain and diarrhea.  Genitourinary: Negative for dysuria, hematuria and flank pain.  Musculoskeletal: Negative for back pain.  Skin: Negative for rash and wound.  Neurological: Positive for dizziness and light-headedness. Negative for syncope, weakness, numbness and headaches.    Allergies  Codeine and Dilaudid  Home Medications   Current Outpatient Rx  Name Route Sig Dispense Refill  . ALBUTEROL SULFATE HFA  108 (90 BASE) MCG/ACT IN AERS Inhalation Inhale 2 puffs into the lungs every 6 (six) hours as needed. For wheeze or shortness of breath      . AMLODIPINE BESYLATE 5 MG PO TABS Oral Take 2 tablets (10 mg total) by mouth daily. 60 tablet 0  . ASPIRIN EC 81 MG PO TBEC Oral Take 81 mg by mouth daily.      Marland Kitchen DOCUSATE SODIUM 100 MG PO CAPS Oral Take 100 mg by mouth daily.    Marland Kitchen DOXAZOSIN MESYLATE 8 MG PO TABS Oral Take 8 mg by mouth at bedtime.      Marland Kitchen FLUTICASONE-SALMETEROL 100-50 MCG/DOSE IN AEPB Inhalation Inhale 1 puff into the lungs every 12 (twelve) hours.      . FUROSEMIDE 40 MG PO TABS Oral Take 40-80 mg by mouth 2 (two) times daily. 2 in the morning and 1 at night     . INSULIN ISOPHANE HUMAN 100 UNIT/ML  SUSP Subcutaneous Inject 5-25 Units into the skin 3 (three) times daily with meals. Based on sliding scale in the morning and mid-morning    . INSULIN REGULAR HUMAN 100 UNIT/ML IJ SOLN Subcutaneous Inject 4-14 Units into the skin 3 (three) times daily as needed. If blood sugar is over 120, take 4 units in the morning. If below 120, do not take in the morning. 6-16 units mid-morning along with Humulin N. 4-14 units at bedtime     . ISOSORBIDE MONONITRATE ER 60 MG PO TB24 Oral Take 1 tablet (60 mg  total) by mouth daily. 30 tablet 0  . PANCRELIPASE (LIP-PROT-AMYL) 12000 UNITS PO CPEP Oral Take 2 capsules by mouth 3 (three) times daily before meals.      Marland Kitchen METOPROLOL TARTRATE 50 MG PO TABS Oral Take 50 mg by mouth 2 (two) times daily.      Carma Leaven M PLUS PO TABS Oral Take 1 tablet by mouth daily.      Marland Kitchen OMEPRAZOLE 20 MG PO CPDR Oral Take 20 mg by mouth daily.      Marland Kitchen OVER THE COUNTER MEDICATION Oral Take 1 tablet by mouth daily. Legatrin PM     . PARICALCITOL 1 MCG PO CAPS Oral Take 1 mcg by mouth daily.      . QUININE SULFATE 324 MG PO CAPS Oral Take 648 mg by mouth every evening.      Marland Kitchen SIMVASTATIN 10 MG PO TABS Oral Take 10 mg by mouth at bedtime.      Marland Kitchen TIOTROPIUM BROMIDE MONOHYDRATE 18 MCG IN  CAPS Inhalation Place 18 mcg into inhaler and inhale daily.      . TRAMADOL HCL 50 MG PO TABS Oral Take 50 mg by mouth every 6 (six) hours as needed. Maximum dose= 8 tablets per day       BP 108/58  Pulse 82  Temp 97.6 F (36.4 C) (Oral)  Resp 19  Ht 5\' 9"  (1.753 m)  Wt 180 lb (81.647 kg)  BMI 26.58 kg/m2  SpO2 91%  Physical Exam  Nursing note and vitals reviewed. Constitutional: He is oriented to person, place, and time. He appears well-developed and well-nourished. No distress.  HENT:  Head: Normocephalic and atraumatic.       Dry MM  Eyes: EOM are normal. Pupils are equal, round, and reactive to light.  Neck: Normal range of motion. Neck supple.  Cardiovascular: Normal rate and regular rhythm.   Pulmonary/Chest: Effort normal. No respiratory distress. He has no wheezes. He has no rales. He exhibits no tenderness.       Decreased BS bl  Abdominal: Soft. Bowel sounds are normal. He exhibits no distension and no mass. There is no tenderness. There is no rebound and no guarding.  Musculoskeletal: Normal range of motion. He exhibits no edema and no tenderness.  Neurological: He is alert and oriented to person, place, and time.       Moves all ext without deficit, sensation intact  Skin: Skin is warm and dry. No rash noted. No erythema. Pallor: reased air movement bl.  Psychiatric: He has a normal mood and affect. His behavior is normal.    ED Course  Procedures (including critical care time)  Labs Reviewed  CBC WITH DIFFERENTIAL - Abnormal; Notable for the following:    WBC 13.5 (*)     RBC 4.00 (*)     Hemoglobin 12.6 (*)     HCT 37.7 (*)     Neutrophils Relative 79 (*)     Neutro Abs 10.7 (*)     Lymphocytes Relative 9 (*)     Monocytes Absolute 1.3 (*)     All other components within normal limits  COMPREHENSIVE METABOLIC PANEL - Abnormal; Notable for the following:    Glucose, Bld 67 (*)     BUN 62 (*)     Creatinine, Ser 4.63 (*)     Albumin 3.4 (*)     Total  Bilirubin 0.2 (*)     GFR calc non Af Amer 11 (*)     GFR  calc Af Amer 13 (*)     All other components within normal limits  URINALYSIS, ROUTINE W REFLEX MICROSCOPIC - Abnormal; Notable for the following:    APPearance TURBID (*)     Hgb urine dipstick MODERATE (*)     Protein, ur 30 (*)     Leukocytes, UA LARGE (*)     All other components within normal limits  URINE MICROSCOPIC-ADD ON - Abnormal; Notable for the following:    Squamous Epithelial / LPF FEW (*)     Bacteria, UA FEW (*)     All other components within normal limits  TROPONIN I  URINE CULTURE   Dg Chest 2 View  08/18/2012  *RADIOLOGY REPORT*  Clinical Data: Left upper chest pain, shortness of breath, dizziness  CHEST - 2 VIEW  Comparison: 11/10/2011  Findings: Evidence of CABG again noted.  Lungs are hyperaerated, which may be seen with emphysema. Diffusely increased interstitial lung markings noted, without focal pulmonary opacity.  No pleural effusion.  Heart size at upper limits of normal.  No acute osseous finding.  IMPRESSION: Mild emphysematous changes without acute cardiopulmonary process.   Original Report Authenticated By: Harrel Lemon, M.D.      1. Chest pain   2. Orthostatic lightheadedness   3. UTI (urinary tract infection)      Date: 08/18/2012  Rate: 78  Rhythm: normal sinus rhythm  QRS Axis: normal  Intervals: PR prolonged  ST/T Wave abnormalities: normal  Conduction Disutrbances:first-degree A-V block   Narrative Interpretation:   Old EKG Reviewed: unchanged    MDM   Seen by Dr Katrinka [name]. OK to f/u with as outpatient.  Will treat UTI and give fluids and re-eval.   Pt states he is getting lightheaded when getting out of bed. Will discuss with [facility] about bringing in for observation and repeat trop.        Loren Racer, MD 08/18/12 [phone]

## 2012-08-19 DIAGNOSIS — E119 Type 2 diabetes mellitus without complications: Secondary | ICD-10-CM

## 2012-08-19 DIAGNOSIS — I10 Essential (primary) hypertension: Secondary | ICD-10-CM

## 2012-08-19 DIAGNOSIS — E785 Hyperlipidemia, unspecified: Secondary | ICD-10-CM

## 2012-08-19 DIAGNOSIS — R42 Dizziness and giddiness: Secondary | ICD-10-CM

## 2012-08-19 LAB — GLUCOSE, CAPILLARY
Glucose-Capillary: 177 mg/dL — ABNORMAL HIGH (ref 70–99)
Glucose-Capillary: 249 mg/dL — ABNORMAL HIGH (ref 70–99)

## 2012-08-19 LAB — COMPREHENSIVE METABOLIC PANEL
BUN: 56 mg/dL — ABNORMAL HIGH (ref 6–23)
CO2: 24 mEq/L (ref 19–32)
Chloride: 107 mEq/L (ref 96–112)
Creatinine, Ser: 4.35 mg/dL — ABNORMAL HIGH (ref 0.50–1.35)
GFR calc non Af Amer: 12 mL/min — ABNORMAL LOW (ref >90)
Glucose, Bld: 145 mg/dL — ABNORMAL HIGH (ref 70–99)
Total Bilirubin: 0.3 mg/dL (ref 0.3–1.2)

## 2012-08-19 LAB — CBC
HCT: 34.2 % — ABNORMAL LOW (ref 39.0–52.0)
MCV: 92.7 fL (ref 78.0–100.0)
RBC: 3.69 MIL/uL — ABNORMAL LOW (ref 4.22–5.81)
WBC: 11.8 10*3/uL — ABNORMAL HIGH (ref 4.0–10.5)

## 2012-08-19 LAB — TROPONIN I: Troponin I: <0.3 ng/mL (ref <0.30)

## 2012-08-19 MED ORDER — PARICALCITOL 1 MCG PO CAPS
1.0000 ug | ORAL_CAPSULE | ORAL | Status: DC
  Administered 2012-08-20: 1 ug via ORAL
  Filled 2012-08-19: qty 1

## 2012-08-19 NOTE — Evaluation (Signed)
Physical Therapy Evaluation Patient Details Name: Raymond [name] MRN: [id number] DOB: Apr 09, 1930 Today's Date: 08/19/2012 Time: 4098-1191 PT Time Calculation (min): 20 min  PT Assessment / Plan / Recommendation Clinical Impression  Pt adm with chest pain and dizziness.  Pt orthostatic during eval but not symptomatic at all. Independent with mobility and no further PT needed.    PT Assessment  Patent does not need any further PT services    Follow Up Recommendations  No PT follow up    Does the patient have the potential to tolerate intense rehabilitation      Barriers to Discharge        Equipment Recommendations  None recommended by PT    Recommendations for Other Services     Frequency      Precautions / Restrictions Precautions Precautions: None   Pertinent Vitals/Pain Pt orthostatic but not symptomatic.      Mobility  Bed Mobility Bed Mobility: Supine to Sit;Sitting - Scoot to Edge of Bed Supine to Sit: 7: Independent Sitting - Scoot to Edge of Bed: 7: Independent Transfers Transfers: Sit to Stand;Stand to Sit Sit to Stand: 7: Independent;From bed;With upper extremity assist Stand to Sit: 7: Independent;To bed;With upper extremity assist Ambulation/Gait Ambulation/Gait Assistance: 6: Modified independent (Device/Increase time) Ambulation Distance (Feet): 170 Feet Assistive device: Straight cane Gait Pattern: Within Functional Limits    Shoulder Instructions     Exercises     PT Diagnosis:    PT Problem List:   PT Treatment Interventions:     PT Goals    Visit Information  Last PT Received On: 08/19/12 Assistance Needed: +1    Subjective Data  Subjective: "I'm ready to go home." Patient Stated Goal: Return home   Prior Functioning  Home Living Lives With: Spouse Available Help at Discharge: Family;Available 24 hours/day Type of Home: House Home Access: Stairs to enter [employer] of Steps: 3 Home Layout: One level Home  Adaptive Equipment: Straight cane Prior Function Level of Independence: Independent Able to Take Stairs?: Yes Driving: Yes Vocation: Works at home Communication Communication: No difficulties    Cognition  Overall Cognitive Status: Appears within functional limits for tasks assessed/performed Arousal/Alertness: Awake/alert Orientation Level: Appears intact for tasks assessed Behavior During Session: [facility] for tasks performed    Extremity/Trunk Assessment Right Lower Extremity Assessment RLE ROM/Strength/Tone: [facility] for tasks assessed Left Lower Extremity Assessment LLE ROM/Strength/Tone: [facility] for tasks assessed   Balance Static Standing Balance Static Standing - Balance Support: No upper extremity supported Static Standing - Level of Assistance: 7: Independent Static Standing - Comment/# of Minutes: 4 minutes  End of Session PT - End of Session Activity Tolerance: Patient tolerated treatment well Patient left: in bed;with call bell/phone within reach;with family/visitor present Nurse Communication: Mobility status  GP Functional Assessment Tool Used: clinical judgement Functional Limitation: Mobility: Walking and moving around Mobility: Walking and Moving Around Current Status [phone]): 0 percent impaired, limited or restricted Mobility: Walking and Moving Around Goal Status [phone]): 0 percent impaired, limited or restricted Mobility: Walking and Moving Around Discharge Status [phone]): 0 percent impaired, limited or restricted   [facility] 08/19/2012, 9:53 AM  [facility] [place] PT [phone]

## 2012-08-19 NOTE — Progress Notes (Signed)
[facility] PROGRESS NOTE  Raymond [name] ZOX:[id number] DOB: Feb 02, 1930 DOA: 08/18/2012 PCP: Beverley Fiedler, MD  Assessment/Plan:  . UTI (lower urinary tract infection), possible urosepsis.  Blood pressure improving with IV antibiotics, but WBC still elevated and only trending down slowly.   - Continue Rocephin -  F/u urine culture:  Re-plated for better growth.  -  Plan to give dose of rocephin #3 tomorrow late morning and discharge with oral antibiotics to complete a 7-day course.    . Orthostatic hypotension, may have been sign of impending sepsis in setting of dehydration and diuretic use.  Improving after gentle hydration.  Patient was seen by PT who recommended no PT follow up.    Chest pain No events on telemetry, ECG stable, and troponins negative.  Evaluated by Dr. Katrinka [name] from the cardiology service who recommended no further work up.  Chest pain likely not cardiac.    Marland KitchenCAD (coronary artery disease)  -Continue with aspirin, Lopressor and statins.   Marland KitchenHTN (hypertension), blood pressure stable to elevated today - continue to trend - continue with metoprolol, Imdur, Lasix and amlodipine.    .DM (diabetes mellitus) Patient has specific home regimen outlined by diabetic educator, however, patient was recently hypoglycemic and will tolerate slightly elevated BP readings while hospitalized.   - Consider restarting home regimen in AM.     .COPD (chronic obstructive pulmonary disease)  -Lungs are clear, as needed bronchodilators. Continue with Spiriva and Advair.   .CKD (chronic kidney disease) stage 4, GFR 15-29 ml/min  -Known stage IV chronic kidney disease, follows up with Dr. Kathrene Bongo as an outpatient. Current creatinine is close to usual baseline, we'll recheck electrolytes in the morning.   Marland KitchenDyslipidemia, stable -Continue statin  DIET:  Diabetic ACCESS: PIV IVF:  None PROPH:  heparin  Code Status: Full code Family Communication: Spoke with patient and  wife  Disposition Plan: Pending results of urine culture and sensitivities   HPI:   Patient is an 78 year old Caucasian male with a past medical history of coronary artery disease status post CABG, stage IV chronic kidney disease, diabetes, hypertension who presented to the ED for evaluation of the above-noted complaints. The patient for the past 3 days he has been having intermittent left-sided chest pain. The patient this pain is not like his typical anginal pain, there is no associated shortness of breath or diaphoresis. He also claimed that for the past 2 days he has been feeling lightheaded, particularly on standing and walking. He denies any syncopal events. In the ED he was evaluated in part to have orthostatic hypotension and also felt to have a urinary tract infection. Because of his significant medical comorbidities I was asked to admit this patient for further evaluation and treatment.  Patient denies any fever, headache, nausea vomiting or diarrhea. There is no history of abdominal pain as well.  Consultants:  Cardiology  Procedures:  None  Antibiotics:  Ceftriaxone 10/10 >>   HPI/Subjective:  Patient states he feels much better.  Denies chest pain, shortness of breath, nausea, vomiting, diarrhea  Objective: Filed Vitals:   08/19/12 0934 08/19/12 0941 08/19/12 1029 08/19/12 1500  BP: 108/56 150/68 151/73 130/62  Pulse:    88  Temp:    98.1 F (36.7 C)  TempSrc:    Oral  Resp:    16  Height:      Weight:      SpO2:    94%    Intake/Output Summary (Last 24 hours) at 08/19/12 1733 Last data filed at  08/19/12 1330  Gross per 24 hour  Intake    600 ml  Output    600 ml  Net      0 ml   Filed Weights   08/18/12 1311 08/19/12 0500  Weight: 81.647 kg (180 lb) 80.74 kg (178 lb)    Exam:   General:  CM, no acute distress  HEENT:  MMM  Cardiovascular: RRR, no mrg  Respiratory: CTAB  Abdomen: Soft, nondistended, nontender  MSK:  NO LEE  Data  Reviewed: Basic Metabolic Panel:  Lab 08/19/12 6045 08/18/12 1849 08/18/12 1320  NA 144 -- 143  K 3.3* -- 3.7  CL 107 -- 103  CO2 24 -- 25  GLUCOSE 145* -- 67*  BUN 56* -- 62*  CREATININE 4.35* 4.55* 4.63*  CALCIUM 8.6 -- 9.7  MG -- -- --  PHOS -- -- --   Liver Function Tests:  Lab 08/19/12 0540 08/18/12 1320  AST 16 20  ALT 9 10  ALKPHOS 49 57  BILITOT 0.3 0.2*  PROT 6.0 7.0  ALBUMIN 2.9* 3.4*   No results found for this basename: LIPASE:5,AMYLASE:5 in the last 168 hours No results found for this basename: AMMONIA:5 in the last 168 hours CBC:  Lab 08/19/12 0540 08/18/12 1849 08/18/12 1320  WBC 11.8* 12.3* 13.5*  NEUTROABS -- -- 10.7*  HGB 11.3* 12.0* 12.6*  HCT 34.2* 36.3* 37.7*  MCV 92.7 93.8 94.3  PLT 238 241 255   Cardiac Enzymes:  Lab 08/19/12 0540 08/18/12 2345 08/18/12 1849 08/18/12 1321  CKTOTAL -- -- -- --  CKMB -- -- -- --  CKMBINDEX -- -- -- --  TROPONINI <0.30 <0.30 <0.30 <0.30   BNP (last 3 results)  Basename 11/10/11 0540  PROBNP 248.1   CBG:  Lab 08/19/12 1616 08/19/12 1145 08/19/12 0729 08/18/12 2209 08/18/12 1741  GLUCAP 177* 178* 150* 125* 108*    Recent Results (from the past 240 hour(s))  URINE CULTURE     Status: Normal (Preliminary result)   Collection Time   08/18/12  1:28 PM      Component Value Range Status Comment   Specimen Description URINE, CLEAN CATCH   Final    Special Requests NONE   Final    Culture  Setup Time 08/18/2012 16:06   Final    Colony Count PENDING   Incomplete    Culture Culture reincubated for better growth   Final    Report Status PENDING   Incomplete      Studies: Dg Chest 2 View  08/18/2012  *RADIOLOGY REPORT*  Clinical Data: Left upper chest pain, shortness of breath, dizziness  CHEST - 2 VIEW  Comparison: 11/10/2011  Findings: Evidence of CABG again noted.  Lungs are hyperaerated, which may be seen with emphysema. Diffusely increased interstitial lung markings noted, without focal pulmonary  opacity.  No pleural effusion.  Heart size at upper limits of normal.  No acute osseous finding.  IMPRESSION: Mild emphysematous changes without acute cardiopulmonary process.   Original Report Authenticated By: Harrel Lemon, M.D.     Scheduled Meds:   . sodium chloride   Intravenous STAT  . amLODipine  10 mg Oral Daily  . aspirin EC  81 mg Oral Daily  . cefTRIAXone (ROCEPHIN)  IV  1 g Intravenous Q24H  . docusate sodium  100 mg Oral Daily  . doxazosin  8 mg Oral QHS  . Fluticasone-Salmeterol  1 puff Inhalation Q12H  . furosemide  40 mg Oral QPM  .  furosemide  80 mg Oral q morning - 10a  . heparin  5,000 Units Subcutaneous Q8H  . insulin aspart  0-15 Units Subcutaneous TID WC  . isosorbide mononitrate  60 mg Oral Daily  . lipase/protease/amylase  2 capsule Oral TID AC  . metoprolol  50 mg Oral BID  . multivitamin with minerals  1 tablet Oral Daily  . pantoprazole  40 mg Oral Q1200  . paricalcitol  1 mcg Oral QODAY  . quiNINE  648 mg Oral QPM  . simvastatin  10 mg Oral QHS  . sodium chloride  3 mL Intravenous Q12H  . tiotropium  18 mcg Inhalation Daily  . DISCONTD: furosemide  40-80 mg Oral BID  . DISCONTD: paricalcitol  1 mcg Oral Daily   Continuous Infusions:   Active Problems:  Chest pain  UTI (lower urinary tract infection)  CAD (coronary artery disease)  HTN (hypertension)  DM (diabetes mellitus)  COPD (chronic obstructive pulmonary disease)  CKD (chronic kidney disease) stage 4, GFR 15-29 ml/min  Dyslipidemia    Time spent: 30    Keysha Damewood, [facility]  [facility] Pager [phone]. If 8PM-8AM, please contact night-coverage at [email or web address], password [facility] 08/19/2012, 5:33 PM  LOS: 1 day

## 2012-08-19 NOTE — Progress Notes (Signed)
08-19-12  Spoke with patient about his diabetes.  Was diagnosed in 1989.  Sees Dr. Beverley Fiedler at [facility] as his primary care physician.  Takes NPH and Regular insulin at home with a certain scale as listed below: At breakfast: CBGs Less than 60  NO insulin    61-99 mg/dl  Take NPH 10 units    161-096 MG/DL  Take NPH 20 units    045 and greater   Take NPH 25 units and Regular 4 units  Mid AM : CBGs less than 100 mg/dl  NO insulin    409-811 mg/dl  Take NPH 5 units and Regular 6 units    176-250 mg/dl take NPH 5 units and Regular 10 units    251-325 mg/dl  Take NPH 5 units and Regular 12 units    326-400 mg/dl   Take NPH 5 units and Regular 14 units    Greater than 400 mg/dl  Take NPH 5 units and Regular 16 units  Bedtime:  CBGs less than 100  NO insulin    101-175 mg/dl  Regular 4 units    914-782 mg/dl  Regular 6 units    956-213 mg/dl  Regular 10 units    086-578 mg/dl  Regular 12 units    Greater than 400 mg/dl   Regular 14 units  Patient keeps detailed records of blood sugars at home.  Will continue to follow while in hospital.

## 2012-08-19 NOTE — Progress Notes (Signed)
Patient Name: Raymond [name] Date of Encounter: 08/19/2012    SUBJECTIVE: No chest pain.  TELEMETRY:  NSR Filed Vitals:   08/18/12 1809 08/18/12 1959 08/18/12 2134 08/19/12 0500  BP: 142/79  157/76 107/56  Pulse: 86  95 80  Temp: 97.4 F (36.3 C)  98 F (36.7 C) 97.8 F (36.6 C)  TempSrc:      Resp: 17  20 18   Height:      Weight:    80.74 kg (178 lb)  SpO2: 92% 96% 94% 93%   No intake or output data in the 24 hours ending 08/19/12 0726  LABS: Basic Metabolic Panel:  Basename 08/19/12 0540 08/18/12 1849 08/18/12 1320  NA 144 -- 143  K 3.3* -- 3.7  CL 107 -- 103  CO2 24 -- 25  GLUCOSE 145* -- 67*  BUN 56* -- 62*  CREATININE 4.35* 4.55* --  CALCIUM 8.6 -- 9.7  MG -- -- --  PHOS -- -- --   CBC:  Basename 08/19/12 0540 08/18/12 1849 08/18/12 1320  WBC 11.8* 12.3* --  NEUTROABS -- -- 10.7*  HGB 11.3* 12.0* --  HCT 34.2* 36.3* --  MCV 92.7 93.8 --  PLT 238 241 --   Cardiac Enzymes:  Basename 08/19/12 0540 08/18/12 2345 08/18/12 1849  CKTOTAL -- -- --  CKMB -- -- --  CKMBINDEX -- -- --  TROPONINI <0.30 <0.30 <0.30   Radiology/Studies:  NAD ECG: NSR with LAD and no acute change c/w 11/2011  Physical Exam: Blood pressure 107/56, pulse 80, temperature 97.8 F (36.6 C), temperature source Oral, resp. rate 18, height 5\' 9"  (1.753 m), weight 80.74 kg (178 lb), SpO2 93.00%. Weight change:    Rhonchi No murmur or rub  ASSESSMENT:  1. Chest pain resolved  Plan:  1. No cardiac eval needed. Call if we can help.  Selinda Eon 08/19/2012, 7:26 AM

## 2012-08-19 NOTE — Progress Notes (Signed)
Utilization review completed.  

## 2012-08-20 LAB — URINE CULTURE

## 2012-08-20 LAB — GLUCOSE, CAPILLARY
Glucose-Capillary: 216 mg/dL — ABNORMAL HIGH (ref 70–99)
Glucose-Capillary: 217 mg/dL — ABNORMAL HIGH (ref 70–99)

## 2012-08-20 MED ORDER — DEXTROSE 5 % IV SOLN
1.0000 g | INTRAVENOUS | Status: DC
  Administered 2012-08-20: 1 g via INTRAVENOUS
  Filled 2012-08-20: qty 10

## 2012-08-20 MED ORDER — CIPROFLOXACIN HCL 250 MG PO TABS: 250.0000 mg | ORAL_TABLET | Freq: Two times a day (BID) | ORAL | Status: DC

## 2012-08-20 NOTE — Discharge Summary (Signed)
Physician Discharge Summary  Raymond [name] ZOX:[id number] DOB: [birth year] DOA: 08/18/2012  PCP: Beverley Fiedler, MD  Admit date: 08/18/2012 Discharge date: 08/20/2012  Recommendations for Outpatient Follow-up:  1. Primary care doctor within 10 days of discharge for repeat UA and UCx, also repeat blood pressure for possible medication titration 2. Cardiology at previously scheduled appointment or as needed 3. Nephrologist at previously scheduled appointment or sooner as needed.    Discharge Diagnoses:  Active Problems:  Chest pain  UTI (lower urinary tract infection)  CAD (coronary artery disease)  HTN (hypertension)  DM (diabetes mellitus)  COPD (chronic obstructive pulmonary disease)  CKD (chronic kidney disease) stage 4, GFR 15-29 ml/min  Dyslipidemia   Discharge Condition: stable, improved  Diet recommendation: Healthy heart, diabetic  Wt Readings from Last 3 Encounters:  08/20/12 77.973 kg (171 lb 14.4 oz)  11/10/11 79.7 kg (175 lb 11.3 oz)    History of present illness:   Patient is an 78 year old Caucasian male with a past medical history of coronary artery disease status post CABG, stage IV chronic kidney disease, diabetes, hypertension who presented to the ED for evaluation of the above-noted complaints. The patient for the past 3 days he has been having intermittent left-sided chest pain. The patient this pain is not like his typical anginal pain, there is no associated shortness of breath or diaphoresis. He also claimed that for the past 2 days he has been feeling lightheaded, particularly on standing and walking. He denies any syncopal events. In the ED he was evaluated in part to have orthostatic hypotension and also felt to have a urinary tract infection. Because of his significant medical comorbidities I was asked to admit this patient for further evaluation and treatment.   Patient denies any fever, headache, nausea vomiting or diarrhea. There is no history of  abdominal pain as well.   Hospital Course:   UTI (lower urinary tract infection), possible urosepsis.  Initial UA was turbid with large LE, few yeast, too numerous to count WBC and few bacteria.  Blood pressure improved with IV ceftriaxone and IVF.  WBC was elevated to 13.5 and trended down to 11.8 on 10/11.  He received three doses of ceftriaxone while in the hospital and on the day of discharge, his urine culture grew mixed flora.  Will prescribe cipro 250 mg BID to complete a 7 day course with follow up with his primary care doctor after completion of antibiotics for repeat urine culture.    . Orthostatic hypotension, may have been sign of impending sepsis in setting of dehydration and diuretic use. Improving after gentle hydration. Patient was seen by PT who recommended no PT follow up.   Chest pain No events on telemetry, ECG stable, and troponins negative. Evaluated by Dr. Katrinka [name] from the cardiology service who recommended no further work up. Chest pain likely not cardiac and may have been related to acute infection or muscular strain.    Marland KitchenCAD (coronary artery disease)   Continued aspirin, Lopressor and statin.   Marland KitchenHTN (hypertension), blood pressure was initially low but were able to restart blood pressure medications late on 10/10.  His blood pressure was mildly elevated on the day of discharge to the 160s/70s.  He should have a repeat blood pressure with his primary care doctor within one week for  Continue with metoprolol, Imdur, Lasix and amlodipine.   .DM (diabetes mellitus) Patient has specific home regimen outlined by diabetic educator, however, patient was recently hypoglycemic.  Tolerate slightly elevated BP readings while hospitalized.  Restarted home regimen at time of discharge.    Marland KitchenCOPD (chronic obstructive pulmonary disease) Remained stable. Continued Spiriva and Advair.   .CKD (chronic kidney disease) stage 4, GFR 11-12 ml/min  At baseline creatinine.  Known stage IV chronic  kidney disease, follows up with Dr. Kathrene Bongo as an outpatient.  .Dyslipidemia, stable Continued statin   Procedures:  CXR  Consultations:  Cardiology, Dr, Katrinka [name] III  Discharge Exam: Filed Vitals:   08/20/12 1051  BP: 162/75  Pulse: 93  Temp:   Resp:    Filed Vitals:   08/19/12 2100 08/20/12 0500 08/20/12 0900 08/20/12 1051  BP: 131/68 156/70  162/75  Pulse: 88 90  93  Temp: 98.2 F (36.8 C) 98.8 F (37.1 C)    TempSrc: Oral     Resp:  18    Height:      Weight:  77.973 kg (171 lb 14.4 oz)    SpO2: 93% 92% 93%     General: CM, no acute distress, sitting up in chair HEENT: MMM  Cardiovascular: RRR, no mrg  Respiratory: CTAB  Abdomen: Soft, nondistended, nontender  MSK: NO LEE   Discharge Instructions      Discharge Orders    Future Orders Please Complete By Expires   Diet - low sodium heart healthy      Increase activity slowly      Discharge instructions      Comments:   You were hospitalized with chest pain and low blood pressure and were found to have a urinary tract infection.  You had ECG and blood tests for heart attack which were negative and you were seen by cardiologist Dr. Katrinka [name] III who did not recommend further heart testing at this time and who believed that your chest pain was likely not related to your heart.  You were given IV fluids and antibiotics and your blood pressure improved. You received three doses of the antibiotic ceftriaxone, last dose at noon on 10/12.  Your urine culture showed mixed flora.  Please complete a 7-day course of antibiotics for urinary tract infection with ciprofloxacin to be taken twice a day, first dose on 10/13 in the morning.  Please follow up with your primary care doctor within 10 days of discharge for repeat urinalysis and urine culture and a blood pressure check as your blood pressure was a little high on the day of discharge.  Please resume your other home medications.   Call MD for:      Comments:   Call 911  if you have chest pain, shortness of breath, slurred speech, facial droop, confusion, or numbness or weakness of an arm or leg.   Call MD for:  temperature >100.4      Call MD for:  persistant nausea and vomiting      Call MD for:  severe uncontrolled pain      Call MD for:  difficulty breathing, headache or visual disturbances      Call MD for:  hives      Call MD for:  persistant dizziness or light-headedness      Call MD for:  extreme fatigue          Medication List     As of 08/20/2012 12:24 PM    TAKE these medications         albuterol 108 (90 BASE) MCG/ACT inhaler   Commonly known as: PROVENTIL HFA;VENTOLIN HFA   Inhale 2 puffs into the lungs every 6 (six) hours as needed. For wheeze  or shortness of breath        amLODipine 5 MG tablet   Commonly known as: NORVASC   Take 2 tablets (10 mg total) by mouth daily.      aspirin EC 81 MG tablet   Take 81 mg by mouth daily.      ciprofloxacin 250 MG tablet   Commonly known as: CIPRO   Take 1 tablet (250 mg total) by mouth 2 (two) times daily.      docusate sodium 100 MG capsule   Commonly known as: COLACE   Take 100 mg by mouth daily.      doxazosin 8 MG tablet   Commonly known as: CARDURA   Take 8 mg by mouth at bedtime.      Fluticasone-Salmeterol 100-50 MCG/DOSE Aepb   Commonly known as: ADVAIR   Inhale 1 puff into the lungs every 12 (twelve) hours.      furosemide 40 MG tablet   Commonly known as: LASIX   Take 40-80 mg by mouth 2 (two) times daily. 2 in the morning and 1 at night      insulin NPH 100 UNIT/ML injection   Commonly known as: HUMULIN N,NOVOLIN N   Inject 5-25 Units into the skin 3 (three) times daily with meals. Based on sliding scale in the morning and mid-morning      insulin regular 100 units/mL injection   Commonly known as: NOVOLIN R,HUMULIN R   Inject 4-14 Units into the skin 3 (three) times daily as needed. If blood sugar is over 120, take 4 units in the morning. If below 120, do not  take in the morning. 6-16 units mid-morning along with Humulin N. 4-14 units at bedtime      isosorbide mononitrate 60 MG 24 hr tablet   Commonly known as: IMDUR   Take 1 tablet (60 mg total) by mouth daily.      lipase/protease/amylase [address] UNITS Cpep   Commonly known as: CREON-10/PANCREASE   Take 2 capsules by mouth 3 (three) times daily before meals.      metoprolol 50 MG tablet   Commonly known as: LOPRESSOR   Take 50 mg by mouth 2 (two) times daily.      multivitamins ther. w/minerals Tabs   Take 1 tablet by mouth daily.      omeprazole 20 MG capsule   Commonly known as: PRILOSEC   Take 20 mg by mouth daily.      OVER THE COUNTER MEDICATION   Take 1 tablet by mouth daily. Legatrin PM      paricalcitol 1 MCG capsule   Commonly known as: ZEMPLAR   Take 1 mcg by mouth daily.      quiNINE 324 MG capsule   Commonly known as: QUALAQUIN   Take 648 mg by mouth every evening.      simvastatin 10 MG tablet   Commonly known as: ZOCOR   Take 10 mg by mouth at bedtime.      tiotropium 18 MCG inhalation capsule   Commonly known as: SPIRIVA   Place 18 mcg into inhaler and inhale daily.      traMADol 50 MG tablet   Commonly known as: ULTRAM   Take 50 mg by mouth every 6 (six) hours as needed. Maximum dose= 8 tablets per day        Follow-up Information    Follow up with Beverley Fiedler, MD. Schedule an appointment as soon as possible for a visit in 10 days. (repeat UA, UCx,  BP check, )    Contact information:   [address] [place] [address] [phone]       Follow up with Annie Sable A, MD. (at regularly scheduled appointment)    Contact information:   [address] [place] [address] [phone]       Follow up with Lesleigh Noe, MD. (As needed)    Contact information:   [address] [place] [address] [phone]           The results of significant diagnostics from this hospitalization (including  imaging, microbiology, ancillary and laboratory) are listed below for reference.    Significant Diagnostic Studies: Dg Chest 2 View  08/18/2012  *RADIOLOGY REPORT*  Clinical Data: Left upper chest pain, shortness of breath, dizziness  CHEST - 2 VIEW  Comparison: 11/10/2011  Findings: Evidence of CABG again noted.  Lungs are hyperaerated, which may be seen with emphysema. Diffusely increased interstitial lung markings noted, without focal pulmonary opacity.  No pleural effusion.  Heart size at upper limits of normal.  No acute osseous finding.  IMPRESSION: Mild emphysematous changes without acute cardiopulmonary process.   Original Report Authenticated By: Harrel Lemon, M.D.     Microbiology: Recent Results (from the past 240 hour(s))  URINE CULTURE     Status: Normal   Collection Time   08/18/12  1:28 PM      Component Value Range Status Comment   Specimen Description URINE, CLEAN CATCH   Final    Special Requests NONE   Final    Culture  Setup Time 08/18/2012 16:06   Final    Colony Count >=100,000 COLONIES/ML   Final    Culture     Final    Value: Multiple bacterial morphotypes present, none predominant. Suggest appropriate recollection if clinically indicated.   Report Status 08/20/2012 FINAL   Final      Labs: Basic Metabolic Panel:  Lab 08/19/12 1027 08/18/12 1849 08/18/12 1320  NA 144 -- 143  K 3.3* -- 3.7  CL 107 -- 103  CO2 24 -- 25  GLUCOSE 145* -- 67*  BUN 56* -- 62*  CREATININE 4.35* 4.55* 4.63*  CALCIUM 8.6 -- 9.7  MG -- -- --  PHOS -- -- --   Liver Function Tests:  Lab 08/19/12 0540 08/18/12 1320  AST 16 20  ALT 9 10  ALKPHOS 49 57  BILITOT 0.3 0.2*  PROT 6.0 7.0  ALBUMIN 2.9* 3.4*   No results found for this basename: LIPASE:5,AMYLASE:5 in the last 168 hours No results found for this basename: AMMONIA:5 in the last 168 hours CBC:  Lab 08/19/12 0540 08/18/12 1849 08/18/12 1320  WBC 11.8* 12.3* 13.5*  NEUTROABS -- -- 10.7*  HGB 11.3* 12.0* 12.6*   HCT 34.2* 36.3* 37.7*  MCV 92.7 93.8 94.3  PLT 238 241 255   Cardiac Enzymes:  Lab 08/19/12 0540 08/18/12 2345 08/18/12 1849 08/18/12 1321  CKTOTAL -- -- -- --  CKMB -- -- -- --  CKMBINDEX -- -- -- --  TROPONINI <0.30 <0.30 <0.30 <0.30   BNP: BNP (last 3 results)  Basename 11/10/11 0540  PROBNP 248.1   CBG:  Lab 08/20/12 0803 08/19/12 2144 08/19/12 1616 08/19/12 1145 08/19/12 0729  GLUCAP 216* 249* 177* 178* 150*    Time coordinating discharge: 45 minutes  Signed:  Anaja Monts  [facility] 08/20/2012, 12:24 PM

## 2013-01-09 ENCOUNTER — Ambulatory Visit (INDEPENDENT_AMBULATORY_CARE_PROVIDER_SITE_OTHER): Payer: Managed Care, Other (non HMO) | Admitting: Internal Medicine

## 2013-01-09 ENCOUNTER — Encounter: Payer: Self-pay | Admitting: Internal Medicine

## 2013-01-09 VITALS — BP 110/62 | HR 84 | Ht 67.0 in | Wt 173.4 lb

## 2013-01-09 NOTE — Assessment & Plan Note (Signed)
Seen on chest x-ray. Crackles maybe rales, consistent with his leg edema and renal insufficiency, attributable to fluid overload. An interstitial fibrosis is not excluded. 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 Plan-CT chest without contrast

## 2013-01-09 NOTE — Assessment & Plan Note (Addendum)
Plan-PFT, 6 that walk test. No changes in current meds for now. I have encouraged him to walk for endurance. Resting room air saturation 90% today. Once we have results of pending tests, we will want to know about overnight oximetry

## 2013-01-09 NOTE — Progress Notes (Signed)
01/09/13- 82 yoM former smoker ( 4 ppd/ 120 pk yrs)   Wife here. referred by Dr Barton [name] for COPD.  reports gradual worsening of DOE, tightness w/ exertion and wheezing.  denies cough, congestion. Dyspnea on exertion for the past several years, noted now on stairs and routine daily activities. Little or no cough. No wheeze. Had one episode of chest pain evaluated by Dr. Katrinka [name]. Unable to have heart catheterization because kidney function cannot tolerate the dye, but he says heart is functioning normally now. History of CABG 1997. Echocardiogram 11/11/2011 EF 65-70%, gr 1 diastolic dysfunction, pulmonary artery systolic pressure 31 mm. Grade 4 diabetic kidney disease, "close to dialysis". Legs remain swollen.  Past history of anemia. No history of asthma. 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 CXR 08/18/12 IMPRESSION:  Mild emphysematous changes without acute cardiopulmonary process.  Original Report Authenticated By: Harrel Lemon, M.D.  Prior to Admission medications   Medication Sig Start Date End Date Taking? Authorizing Provider  albuterol (PROVENTIL HFA;VENTOLIN HFA) 108 (90 BASE) MCG/ACT inhaler Inhale 2 puffs into the lungs every 6 (six) hours as needed. For wheeze or shortness of breath     Yes Historical Provider, MD  amLODipine (NORVASC) 5 MG tablet Take 2 tablets (10 mg total) by mouth daily. 11/12/11  Yes Elease Etienne, MD  aspirin EC 81 MG tablet Take 81 mg by mouth daily.     Yes Historical Provider, MD  docusate sodium (COLACE) 100 MG capsule Take 100 mg by mouth daily.   Yes Historical Provider, MD  doxazosin (CARDURA) 8 MG tablet Take 8 mg by mouth at bedtime.     Yes Historical Provider, MD  Fluticasone-Salmeterol (ADVAIR) 100-50 MCG/DOSE AEPB Inhale 1 puff into the lungs every 12 (twelve) hours.     Yes Historical Provider, MD  furosemide (LASIX) 40 MG tablet 2 in the morning  and 1 at night   Yes Historical Provider, MD  insulin NPH (HUMULIN N,NOVOLIN N) 100 UNIT/ML injection Sliding scale with each meal   Yes Historical Provider, MD  insulin regular (NOVOLIN R,HUMULIN R) 100 units/mL injection Sliding scale with each meal   Yes Historical Provider, MD  isosorbide mononitrate (IMDUR) 60 MG 24 hr tablet Take 1 tablet (60 mg total) by mouth daily. 11/12/11 01/09/14 Yes Elease Etienne, MD  lipase/protease/amylase (CREON-10/PANCREASE) 12000 UNITS CPEP Take 2 capsules by mouth 3 (three) times daily before meals.     Yes Historical Provider, MD  metoprolol (LOPRESSOR) 50 MG tablet Take 50 mg by mouth 2 (two) times daily.     Yes Historical Provider, MD  Multiple Vitamins-Minerals (MULTIVITAMINS THER. W/MINERALS) TABS Take 1 tablet by mouth daily.     Yes Historical Provider, MD  omeprazole (PRILOSEC) 20 MG capsule Take 20 mg by mouth daily.     Yes Historical Provider, MD  OVER THE COUNTER MEDICATION Take 1 tablet by mouth daily. Legatrin PM    Yes Historical Provider, MD  paricalcitol (ZEMPLAR) 1 MCG capsule Take 1 mcg by mouth every other day.    Yes Historical Provider, MD  quiNINE (QUALAQUIN) 324 MG capsule 2 caps by mouth every evening   Yes Historical Provider, MD  simvastatin (ZOCOR) 10 MG tablet Take 10 mg by mouth at bedtime.     Yes Historical Provider, MD  tiotropium (SPIRIVA) 18 MCG inhalation capsule Place 18 mcg into inhaler and inhale daily.  Yes Historical Provider, MD  traMADol (ULTRAM) 50 MG tablet Take 50 mg by mouth every 6 (six) hours as needed. Maximum dose= 8 tablets per day    Yes Historical Provider, MD   Past Medical History  Diagnosis Date  . COPD (chronic obstructive pulmonary disease)   . Hypertension   . CAD (coronary artery disease), autologous vein bypass graft   . High cholesterol   . Acute chest pain 08/18/2012    "cardiologist said it was not my heart" (08/18/2012)  . Pneumonia 1938  . Shortness of breath     "related to the COPD"  (08/18/2012)  . Type II diabetes mellitus   . History of blood transfusion 1938  . Colon cancer   . Chronic kidney disease, stage 4 (severe)   . Arthritis     "back, hands, neck" (08/18/2012)  . History of pancreatitis ~ 1985  . Anginal pain    Past Surgical History  Procedure Laterality Date  . Appendectomy  1938  . Inguinal hernia repair  2000's    bilaterally  . Cataract extraction w/ intraocular lens  implant, bilateral  2000's  . Coronary artery bypass graft  2000's    CABG X4  . Colectomy  2000's    "cancer; prior to having double hernia operation" (08/18/2012)   History reviewed. No pertinent family history. History   Social History  . Marital Status: Married    Spouse Name: N/A    Number of Children: N/A  . Years of Education: N/A   Occupational History  . Not on file.   Social History Main Topics  . Smoking status: Former Smoker -- 4.00 packs/day for 30 years    Types: Cigarettes    Quit date: 07/13/1983  . Smokeless tobacco: Never Used  . Alcohol Use: Yes     Comment: 08/18/2012 "drank too much; stopped ~ 1985"  . Drug Use: No  . Sexually Active: No   Other Topics Concern  . Not on file   Social History Narrative  . No narrative on file   ROS-see HPI Constitutional:   No-   weight loss, night sweats, fevers, chills, fatigue, lassitude. HEENT:   No-  headaches, difficulty swallowing, tooth/dental problems, sore throat,       No-  sneezing, itching, ear ache, nasal congestion, post nasal drip,  CV:  No-   chest pain, orthopnea, PND, +swelling in lower extremities, anasarca,                                  dizziness, palpitations Resp:   +shortness of breath with exertion or at rest.              No-   productive cough,  No non-productive cough,  No- coughing up of blood.              No-   change in color of mucus.  No- wheezing.   Skin: No-   rash or lesions. GI:  No-   heartburn, indigestion, abdominal pain, nausea, vomiting, diarrhea,                  change in bowel habits, loss of appetite GU: No-   dysuria, change in color of urine, no urgency or frequency.  No- flank pain. MS:  No-   joint pain or swelling.  No- decreased range of motion.  No- back pain. Neuro-     nothing  unusual Psych:  No- change in mood or affect. No depression or anxiety.  No memory loss.  OBJ- Physical Exam General- Alert, Oriented, Affect-appropriate, Distress- none acute Skin- rash-none, lesions- none, excoriation- none Lymphadenopathy- none Head- atraumatic            Eyes- Gross vision intact, PERRLA, conjunctivae and secretions clear            Ears- Hearing, canals-normal            Nose- Clear, no-Septal dev, mucus, polyps, erosion, perforation             Throat- Mallampati III , mucosa clear , drainage- none, tonsils- atrophic Neck- flexible , trachea midline, no stridor , thyroid nl, carotid no bruit Chest - symmetrical excursion , unlabored           Heart/CV- RRR , no murmur , no gallop  , no rub, nl s1 s2                           - JVD- none , edema+2-3 pitting edema lower legs, stasis changes- none, varices- none           Lung- + fine crackles lower third of back, wheeze- none, cough- none , dullness-none, rub- none           Chest wall-  Abd- tender-no, distended-no, bowel sounds-present, HSM- no Br/ Gen/ Rectal- Not done, not indicated Extrem- cyanosis- none, clubbing, none, atrophy- none, strength- nl Neuro- grossly intact to observation

## 2013-01-09 NOTE — Patient Instructions (Addendum)
Order- schedule CT chest- No contrast        Dx interstitial lung disease              Schedule PFT and 6  MWT       Dx interstitial lung disease

## 2013-01-13 ENCOUNTER — Other Ambulatory Visit: Payer: Managed Care, Other (non HMO)

## 2013-01-20 ENCOUNTER — Ambulatory Visit (INDEPENDENT_AMBULATORY_CARE_PROVIDER_SITE_OTHER)
Admission: RE | Admit: 2013-01-20 | Discharge: 2013-01-20 | Disposition: A | Discharge status: Home / Self Care | Payer: Managed Care, Other (non HMO) | Source: Ambulatory Visit | Attending: Internal Medicine | Admitting: Internal Medicine

## 2013-01-25 NOTE — Progress Notes (Signed)
Quick Note:  Pt aware of results and to keep 02-09-13 appointments for 6mw, pft, and OV with CY. ______

## 2013-02-09 ENCOUNTER — Ambulatory Visit (INDEPENDENT_AMBULATORY_CARE_PROVIDER_SITE_OTHER): Payer: Managed Care, Other (non HMO) | Admitting: Internal Medicine

## 2013-02-09 ENCOUNTER — Encounter: Payer: Self-pay | Admitting: Internal Medicine

## 2013-02-09 ENCOUNTER — Other Ambulatory Visit: Payer: Managed Care, Other (non HMO)

## 2013-02-09 VITALS — BP 130/72 | HR 85 | Ht 66.5 in | Wt 175.4 lb

## 2013-02-09 DIAGNOSIS — J849 Interstitial pulmonary disease, unspecified: Secondary | ICD-10-CM

## 2013-02-09 DIAGNOSIS — J439 Emphysema, unspecified: Secondary | ICD-10-CM

## 2013-02-09 DIAGNOSIS — J8410 Pulmonary fibrosis, unspecified: Secondary | ICD-10-CM

## 2013-02-09 DIAGNOSIS — R0600 Dyspnea, unspecified: Secondary | ICD-10-CM

## 2013-02-09 DIAGNOSIS — J438 Other emphysema: Secondary | ICD-10-CM

## 2013-02-09 NOTE — Patient Instructions (Addendum)
We can continue your medicines, including Advair and Spiriva, and the rescue inhaler  Order- ONOX room air   Dx COPD with emphysema  Order- lab a1AT assay    Dx COPD with emphysema  Order- refer to Pulmonary Rehab   Gold 3-4 COPD

## 2013-02-09 NOTE — Progress Notes (Signed)
01/09/13- 82 yoM former smoker ( 4 ppd/ 120 pk yrs)   Wife here. referred by Dr Barton [name] for COPD.  reports gradual worsening of DOE, tightness w/ exertion and wheezing.  denies cough, congestion. Dyspnea on exertion for the past several years, noted now on stairs and routine daily activities. Little or no cough. No wheeze. Had one episode of chest pain evaluated by Dr. Katrinka [name]. Unable to have heart catheterization because kidney function cannot tolerate the dye, but he says heart is functioning normally now. History of CABG 1997. Echocardiogram 11/11/2011 EF 65-70%, gr 1 diastolic dysfunction, pulmonary artery systolic pressure 31 mm. Grade 4 diabetic kidney disease, "close to dialysis". Legs remain swollen.  Past history of anemia. No history of asthma. 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 CXR 08/18/12 IMPRESSION:  Mild emphysematous changes without acute cardiopulmonary process.  Original Report Authenticated By: Harrel Lemon, M.D.   02/09/13- 79 yoM former smoker ( 4 ppd/ 120 pk yrs) referred by Dr Barton [name] for COPD Follows for: pt here to go over 6mw and pft.states has increase sob upon activity. Denies any wheezing non productive cough. Wife here  He recognizes no change since last visit, as the season progresses. No acute events. Occasionally uses rescue inhaler. PFT:02/09/2013- Severe emphysema with insignificant response to bronchodilator. FEC 2.47/67%, FEV1 1.10/47%, FEV1/FVC 0.45. RV 154%, DLCO 41%. 6 minute walk test 02/09/2013-96%, 86%, 93%, 144 m. Marked shortness of breath, had to stop after 4 minutes.  ROS-see HPI Constitutional:   No-   weight loss, night sweats, fevers, chills, fatigue, lassitude. HEENT:   No-  headaches, difficulty swallowing, tooth/dental problems, sore throat,       No-  sneezing, itching, ear ache, nasal congestion, post nasal drip,  CV:  No-   chest  pain, orthopnea, PND, +swelling in lower extremities, anasarca,                                  dizziness, palpitations Resp:   +shortness of breath with exertion or at rest.              No-   productive cough,  No non-productive cough,  No- coughing up of blood.              No-   change in color of mucus.  No- wheezing.   Skin: No-   rash or lesions. GI:  No-   heartburn, indigestion, abdominal pain, nausea, vomiting,  GU:  MS:  No-   joint pain or swelling.   Neuro-     nothing unusual Psych:  No- change in mood or affect. No depression or anxiety.  No memory loss.  OBJ- Physical Exam General- Alert, Oriented, Affect-appropriate, Distress- none acute Skin- rash-none, lesions- none, excoriation- none Lymphadenopathy- none Head- atraumatic            Eyes- Gross vision intact, PERRLA, conjunctivae and secretions clear            Ears- +Hearing aids            Nose- Clear, no-Septal dev, mucus, polyps, erosion, perforation             Throat- Mallampati III , mucosa clear , drainage- none, tonsils- atrophic Neck- flexible , trachea midline, no stridor , thyroid nl, carotid no bruit Chest - symmetrical excursion ,  unlabored           Heart/CV- RRR , no murmur , no gallop  , no rub, nl s1 s2                           - JVD+ 1 cm , edema+trace, stasis changes- none, varices- none           Lung- + distant, quiet breath sounds., wheeze- none, cough- none , dullness-none, rub- none           Chest wall-  Abd-  Br/ Gen/ Rectal- Not done, not indicated Extrem- cyanosis- none, clubbing, none, atrophy- none, strength- nl Neuro- grossly intact to observation

## 2013-02-09 NOTE — Progress Notes (Signed)
PFT done today. 

## 2013-02-15 LAB — ALPHA-1 ANTITRYPSIN PHENOTYPE

## 2013-02-15 NOTE — Progress Notes (Signed)
Quick Note:  Pt aware of results. ______ 

## 2013-02-18 NOTE — Assessment & Plan Note (Signed)
Predominant pattern by PFT is emphysema, rather than restriction of interstitial lung disease.  No interstitial disease on CT chest, March 2014. This problem is resolved.

## 2013-02-18 NOTE — Assessment & Plan Note (Addendum)
Severe emphysema. We can't expect much response to medications. I have discussed exercise as an opportunity to build some stamina and recommended pulmonary rehabilitation. He desaturates with exercise so we will evaluate for oxygen qualification. Plan:a1AT assay, overnight oximetry

## 2013-02-19 NOTE — Progress Notes (Signed)
Documentation for 6 MWT 

## 2013-03-06 ENCOUNTER — Other Ambulatory Visit: Payer: Self-pay | Admitting: Internal Medicine

## 2013-03-06 DIAGNOSIS — J449 Chronic obstructive pulmonary disease, unspecified: Secondary | ICD-10-CM

## 2013-03-13 ENCOUNTER — Encounter: Payer: Self-pay | Admitting: Internal Medicine

## 2013-03-14 NOTE — Telephone Encounter (Signed)
Sats sent to [facility] for O2 needs. E-mail sent to patient.

## 2013-03-17 ENCOUNTER — Encounter: Payer: Self-pay | Admitting: Internal Medicine

## 2013-03-17 NOTE — Telephone Encounter (Signed)
I spoke with  Shanda Bumps at [facility] and advised that the pt has not heard anything from them in regards to his oxygen. Shanda Bumps stated that they need face-to-face, they did receive pt saturations. So I faxed face-to-face to [facility] and asked that they contact pt ASAP. I emailed the pt to let him know about the delay. Carron Curie, CMA

## 2013-03-20 ENCOUNTER — Encounter: Payer: Self-pay | Admitting: Internal Medicine

## 2013-03-21 ENCOUNTER — Encounter: Payer: Self-pay | Admitting: Internal Medicine

## 2013-03-21 NOTE — Telephone Encounter (Signed)
This is correct. Your last OV was 02/09/13 and with the Pulmonary Rehab classes being in such demand the classes often take about 10-12weeks to get into. So please expect to receive a call then and if not please do not hesistate to call our office. Thank you   Pulmonary Triage Nurse

## 2013-03-24 ENCOUNTER — Telehealth: Payer: Self-pay | Admitting: Internal Medicine

## 2013-03-24 NOTE — Telephone Encounter (Signed)
Raymond [name] have you seen any papers come through from [employer] for the pt to get a portable concentrator?  Please advise. Thanks

## 2013-03-24 NOTE — Telephone Encounter (Signed)
Paper came through to me for portable from [employer]. Form put in Dr Maple Hudson purple folder .Kandice Hams

## 2013-03-27 ENCOUNTER — Telehealth: Payer: Self-pay | Admitting: Internal Medicine

## 2013-03-27 NOTE — Telephone Encounter (Signed)
Per Alida this form was faxed back earlier today. Carron Curie, CMA

## 2013-03-27 NOTE — Telephone Encounter (Signed)
Duplicate message. Marcelis Wissner, CMA  

## 2013-03-27 NOTE — Telephone Encounter (Signed)
Done

## 2013-03-31 ENCOUNTER — Encounter: Payer: Self-pay | Admitting: Internal Medicine

## 2013-03-31 ENCOUNTER — Ambulatory Visit (INDEPENDENT_AMBULATORY_CARE_PROVIDER_SITE_OTHER): Payer: Medicare Other | Admitting: Internal Medicine

## 2013-03-31 VITALS — BP 124/68 | HR 79 | Ht 66.5 in | Wt 173.0 lb

## 2013-03-31 DIAGNOSIS — J439 Emphysema, unspecified: Secondary | ICD-10-CM

## 2013-03-31 DIAGNOSIS — R918 Other nonspecific abnormal finding of lung field: Secondary | ICD-10-CM

## 2013-03-31 DIAGNOSIS — J438 Other emphysema: Secondary | ICD-10-CM

## 2013-03-31 NOTE — Patient Instructions (Addendum)
We will aim for O2 2L/ min for sleep and exertion. You can take it off if you are sitting or not very active at home. We try to keep your oxygen saturation between 90-94% most of the time.  Order- future, schedule CT chest non contrast  September, 2014 before next ov. Dx lung nodules

## 2013-03-31 NOTE — Progress Notes (Signed)
01/09/13- 82 yoM former smoker ( 4 ppd/ 120 pk yrs)   Wife here. referred by Dr Barton [name] for COPD.  reports gradual worsening of DOE, tightness w/ exertion and wheezing.  denies cough, congestion. Dyspnea on exertion for the past several years, noted now on stairs and routine daily activities. Little or no cough. No wheeze. Had one episode of chest pain evaluated by Dr. Katrinka [name]. Unable to have heart catheterization because kidney function cannot tolerate the dye, but he says heart is functioning normally now. History of CABG 1997. Echocardiogram 11/11/2011 EF 65-70%, gr 1 diastolic dysfunction, pulmonary artery systolic pressure 31 mm. Grade 4 diabetic kidney disease, "close to dialysis". Legs remain swollen.  Past history of anemia. No history of asthma. 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 CXR 08/18/12 IMPRESSION:  Mild emphysematous changes without acute cardiopulmonary process.  Original Report Authenticated By: Harrel Lemon, M.D.   02/09/13- 21 yoM former smoker ( 4 ppd/ 120 pk yrs) referred by Dr Barton [name] for COPD Follows for: pt here to go over 6mw and pft.states has increase sob upon activity. Denies any wheezing non productive cough. Wife here  He recognizes no change since last visit, as the season progresses. No acute events. Occasionally uses rescue inhaler. PFT:02/09/2013- Severe emphysema with insignificant response to bronchodilator. FEC 2.47/67%, FEV1 1.10/47%, FEV1/FVC 0.45. RV 154%, DLCO 41%. 6 minute walk test 02/09/2013-96%, 86%, 93%, 144 m. Marked shortness of breath, had to stop after 4 minutes.  03/31/13- 82 yoM former smoker ( 4 ppd/ 120 pk yrs) referred by Dr Barton [name] for COPD FOLLOWS FOR: Review ONO with patient; Uses O2 24/7; as long as using O2 patient feels pretty good. Wife here. He got tired of tripping over O2 line and bought his own portable  concentrator. 2L/Min. ONOX 02/16/13 Pos a1AT 02/09/13- Normal MM 168 CT chest 01/25/13 IMPRESSION:  1. No findings to suggest an interstitial lung disease at this  time.  2. Mild diffuse bronchial wall thickening with moderate  centrilobular and mild paraseptal emphysema; imaging findings  compatible with underlying COPD.  3. Nonspecific pulmonary nodules in the left lower lobe measuring  only 6 mm in size. Given the obvious smoking related changes in  the lungs, the patient is at high risk for bronchogenic carcinoma  and a follow-up chest CT at 6-12 months is recommended. This  recommendation follows the consensus statement: Guidelines for  Management of Small Pulmonary Nodules Detected on CT Scans: A  Statement from the Fleischner Society as published in Radiology  2005; 237:395-400.  4. The appearance of the liver suggests some early changes of  cirrhosis.  5. Atherosclerosis, including left main and three-vessel coronary  artery disease. Status post median sternotomy for CABG with a  LIMA to the LAD.  6. Additional incidental findings, as above.  Original Report Authenticated By: [name], M.D.   ROS-see HPI Constitutional:   No-   weight loss, night sweats, fevers, chills, fatigue, lassitude. HEENT:   No-  headaches, difficulty swallowing, tooth/dental problems, sore throat,       No-  sneezing, itching, ear ache, nasal congestion, post nasal drip,  CV:  No-   chest pain, orthopnea, PND, +swelling in lower extremities, anasarca,  dizziness, palpitations Resp:   +shortness of breath with exertion or at rest.              No-   productive cough,  No non-productive cough,  No- coughing up of blood.              No-   change in color of mucus.  No- wheezing.   Skin: No-   rash or lesions. GI:  No-   heartburn, indigestion, abdominal pain, nausea, vomiting,  GU:  MS:  No-   joint pain or swelling.   Neuro-     nothing unusual Psych:  No-  change in mood or affect. No depression or anxiety.  No memory loss.  OBJ- Physical Exam General- Alert, Oriented, Affect-appropriate, Distress- none acute Skin- rash-none, lesions- none, excoriation- none Lymphadenopathy- none Head- atraumatic            Eyes- Gross vision intact, PERRLA, conjunctivae and secretions clear            Ears- +Hearing aids            Nose- Clear, no-Septal dev, mucus, polyps, erosion, perforation             Throat- Mallampati III , mucosa clear , drainage- none, tonsils- atrophic Neck- flexible , trachea midline, no stridor , thyroid nl, carotid no bruit Chest - symmetrical excursion , unlabored           Heart/CV- RRR , no murmur , no gallop  , no rub, nl s1 s2                           - JVD+ 1 cm , edema+2-3, stasis changes- none, varices- none           Lung- + distant, + trace wheeze/ crackles, cough- none , dullness-none, rub- none           Chest wall-  Abd-  Br/ Gen/ Rectal- Not done, not indicated Extrem- cyanosis- none, clubbing, none, atrophy- none, strength- nl. Cane. Neuro- grossly intact to observation

## 2013-04-13 DIAGNOSIS — R918 Other nonspecific abnormal finding of lung field: Secondary | ICD-10-CM | POA: Insufficient documentation

## 2013-04-13 NOTE — Assessment & Plan Note (Signed)
Images reviewed with them and plan f/u CT in September, 2014

## 2013-04-13 NOTE — Assessment & Plan Note (Signed)
COPD with chronic hypoxic respiratory failure and [place] Plan- continue O2 sleep and exertion 2 L

## 2013-04-24 ENCOUNTER — Encounter: Payer: Self-pay | Admitting: Internal Medicine

## 2013-04-28 ENCOUNTER — Other Ambulatory Visit: Payer: Self-pay | Admitting: Internal Medicine

## 2013-04-28 DIAGNOSIS — J439 Emphysema, unspecified: Secondary | ICD-10-CM

## 2013-07-05 ENCOUNTER — Encounter (HOSPITAL_COMMUNITY): Payer: Self-pay

## 2013-07-05 ENCOUNTER — Encounter (HOSPITAL_COMMUNITY)
Admission: RE | Admit: 2013-07-05 | Discharge: 2013-07-05 | Disposition: A | Discharge status: Home / Self Care | Payer: Managed Care, Other (non HMO) | Source: Ambulatory Visit | Attending: Internal Medicine | Admitting: Internal Medicine

## 2013-07-05 NOTE — Progress Notes (Addendum)
Raymond [name] arrived today via wheelchair for orientation to Pulmonary Rehab. He is accompanied by his wife.   He is oriented x 3, appears his stated age.  Skin warm, dry, some areas dry and scaly, color is pale.  He uses a cane for walking.  Mild dyspnea, breath sounds distant, some mild wheezing and crackles in bases.  Grips strong and equal.  Bilateral lower extremity pitting edema +2-3.  Difficult to palpate pulses due to edema.  He states this is normal for him.  Denies any burning in his feet and legs and no open areas.  Heart rate is normal, his wife states he does have intermittent chest pain which he uses NTG for relief per Dr. Verdis Prime.  This occurs only a few times each month.  States he has heart blockage, however due to kidney failure, he cannot have a procedure that requires "dye". He is diabetic, we discussed checking his CBG prior to and after exercise.  He will bring his glucometer to check.  Also reviewed with patient and wife meals prior to exercise and the use of oxygen when he is exercising due to cardiac issues.  Demonstration and practice of PLB using pulse oximeter.  Patient able to return demonstration satisfactorily. Safety and hand hygiene in the exercise area reviewed with patient.  Patient voices understanding.  Goals set for improvement in QOL activities at home and improvement in level of dyspnea. He is scheduled to begin exercise on 07/13/2013.  2:45 pm to 4:00 pm Cathie Olden RN

## 2013-07-05 NOTE — Progress Notes (Signed)
Raymond [name]      Age 79     Male                                                                                                    INITIAL PSYCHOSOCIAL ASSESSMENT  Raymond [name] is retired,  lives with his wife.  She is still employed.  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  He is active at home, likes to work on computer, takes care of all household finances.  He also likes to read, they enjoy movies together on big screen TV.  They recently returned from trip to [place] to visit family and great grandchildren.  They have some regularly planned activities outside the home as well.  His outlook is very positive, PHQ score was 0.  He does not exhibit any signs of depression.  His biggest stressor is having to get up so much at night to go to bathroom. We will monitor and evaluate progress at regular intervals.   07/05/2013  Cathie Olden RN

## 2013-07-05 NOTE — Progress Notes (Deleted)
Raymond [name]      Age 79     Male                                                                                                    INITIAL PSYCHOSOCIAL ASSESSMENT  Raymond [name] is retired,  lives with his wife.  She is still employed.  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  He is active at home, likes to work on computer, takes care of all household finances.  He also likes to read, they enjoy movies together on big screen TV.  They recently returned from trip to [place] to visit family and great grandchildren.  They have some regularly planned activities outside the home as well.  His outlook is very positive, PHQ score was 0.  He does not exhibit any signs of depression.  His biggest stressor is having to get up so much at night to go to bathroom. We will monitor and evaluate progress at regular intervals.

## 2013-07-11 ENCOUNTER — Ambulatory Visit (INDEPENDENT_AMBULATORY_CARE_PROVIDER_SITE_OTHER)
Admission: RE | Admit: 2013-07-11 | Discharge: 2013-07-11 | Disposition: A | Discharge status: Home / Self Care | Payer: Managed Care, Other (non HMO) | Source: Ambulatory Visit | Attending: Internal Medicine | Admitting: Internal Medicine

## 2013-07-11 DIAGNOSIS — R918 Other nonspecific abnormal finding of lung field: Secondary | ICD-10-CM

## 2013-07-12 NOTE — Progress Notes (Signed)
Quick Note:  LMOM TCB x1. ______ 

## 2013-07-13 ENCOUNTER — Encounter (HOSPITAL_COMMUNITY)
Admission: RE | Admit: 2013-07-13 | Discharge: 2013-07-13 | Disposition: A | Discharge status: Home / Self Care | Payer: Managed Care, Other (non HMO) | Source: Ambulatory Visit | Attending: Internal Medicine | Admitting: Internal Medicine

## 2013-07-13 DIAGNOSIS — I2510 Atherosclerotic heart disease of native coronary artery without angina pectoris: Secondary | ICD-10-CM | POA: Insufficient documentation

## 2013-07-13 DIAGNOSIS — N185 Chronic kidney disease, stage 5: Secondary | ICD-10-CM | POA: Insufficient documentation

## 2013-07-13 DIAGNOSIS — I120 Hypertensive chronic kidney disease with stage 5 chronic kidney disease or end stage renal disease: Secondary | ICD-10-CM | POA: Insufficient documentation

## 2013-07-13 DIAGNOSIS — Z951 Presence of aortocoronary bypass graft: Secondary | ICD-10-CM | POA: Insufficient documentation

## 2013-07-13 DIAGNOSIS — Z5189 Encounter for other specified aftercare: Secondary | ICD-10-CM | POA: Insufficient documentation

## 2013-07-13 DIAGNOSIS — J449 Chronic obstructive pulmonary disease, unspecified: Secondary | ICD-10-CM | POA: Insufficient documentation

## 2013-07-13 DIAGNOSIS — J4489 Other specified chronic obstructive pulmonary disease: Secondary | ICD-10-CM | POA: Insufficient documentation

## 2013-07-13 NOTE — Progress Notes (Signed)
Mr. Brahm arrived today for exercise in Pulmonary Rehab.  He was not using portable oxygen, very dyspneaic on arrival.  He was brought by wheelchair from [facility].  His pre exercise CBG was 96, he stated he took insulin according to sliding scale at 10:00 am.  He brought a snack, we advised to eat prior to scheduled walk test.  He was able to walk just over 600 feet, needed to stop before 6 min due to increased dyspnea.  Prior to walk test, his oxygen saturation was 91% RA and he was started on oxygen at 3L to increase his sat to 95%.  His sat did drop to 91%3L while ambulating during walk test.  He recovered, we proceeded to recheck CBG prior to starting his exercise, results 86.  He was given additional snack. While we were waiting for recheck , we discussed in detail (  15 MIN) the importance of using   oxygen with activity as it relates to his overall medical conditions as well as his shortness of breath. Recheck CBG was 87 after 15 min.  He was given additional juice.  During the time we waited for recheck, we discussed again in detail his blood sugar values and how exercise can drop blood sugar.  He is going to decrease his sliding scale insulin at 10 am check on the days of his exercise as we monitor closely for better controls.  He is a very pleasant gentlemen, voices understanding of the above and is anxious to participate.  Recheck of CBG after last snack was 147.  He denied any symptoms and was transported to [facility] for discharge.

## 2013-07-13 NOTE — Progress Notes (Signed)
Quick Note:  Pt aware of results. ______ 

## 2013-07-18 ENCOUNTER — Telehealth (HOSPITAL_COMMUNITY): Payer: Self-pay | Admitting: Family Medicine

## 2013-07-18 ENCOUNTER — Encounter (HOSPITAL_COMMUNITY): Payer: Managed Care, Other (non HMO)

## 2013-07-20 ENCOUNTER — Encounter (HOSPITAL_COMMUNITY)
Admission: RE | Admit: 2013-07-20 | Discharge: 2013-07-20 | Disposition: A | Discharge status: Home / Self Care | Payer: Managed Care, Other (non HMO) | Source: Ambulatory Visit | Attending: Internal Medicine | Admitting: Internal Medicine

## 2013-07-25 ENCOUNTER — Encounter (HOSPITAL_COMMUNITY): Payer: Managed Care, Other (non HMO)

## 2013-07-27 ENCOUNTER — Encounter (HOSPITAL_COMMUNITY): Payer: Managed Care, Other (non HMO)

## 2013-08-01 ENCOUNTER — Encounter: Payer: Self-pay | Admitting: Internal Medicine

## 2013-08-01 ENCOUNTER — Telehealth (HOSPITAL_COMMUNITY): Payer: Self-pay | Admitting: *Deleted

## 2013-08-01 ENCOUNTER — Ambulatory Visit (INDEPENDENT_AMBULATORY_CARE_PROVIDER_SITE_OTHER): Payer: Managed Care, Other (non HMO) | Admitting: Internal Medicine

## 2013-08-01 ENCOUNTER — Encounter (HOSPITAL_COMMUNITY): Payer: Managed Care, Other (non HMO)

## 2013-08-01 VITALS — BP 118/80 | HR 80 | Ht 66.5 in | Wt 166.8 lb

## 2013-08-01 DIAGNOSIS — J438 Other emphysema: Secondary | ICD-10-CM

## 2013-08-01 DIAGNOSIS — R918 Other nonspecific abnormal finding of lung field: Secondary | ICD-10-CM

## 2013-08-01 DIAGNOSIS — J439 Emphysema, unspecified: Secondary | ICD-10-CM

## 2013-08-01 NOTE — Progress Notes (Signed)
01/09/13- 75 yoM former smoker ( 4 ppd/ 120 pk yrs)   Wife here. referred by Dr Harlene Ramus for COPD.  reports gradual worsening of DOE, tightness w/ exertion and wheezing.  denies cough, congestion. Dyspnea on exertion for the past several years, noted now on stairs and routine daily activities. Little or no cough. No wheeze. Had one episode of chest pain evaluated by Dr. Tamala Julian. Unable to have heart catheterization because kidney function cannot tolerate the dye, but he says heart is functioning normally now. History of CABG 1997. Echocardiogram 11/11/2011 EF 95-28%, gr 1 diastolic dysfunction, pulmonary artery systolic pressure 31 mm. Grade 4 diabetic kidney disease, "close to dialysis". Legs remain swollen.  Past history of anemia. No history of asthma. 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 CXR 08/18/12 IMPRESSION:  Mild emphysematous changes without acute cardiopulmonary process.  Original Report Authenticated By: Arline Asp, M.D.   02/09/13- 71 yoM former smoker ( 4 ppd/ 120 pk yrs) referred by Dr Harlene Ramus for COPD Follows for: pt here to go over 32mw and pft.states has increase sob upon activity. Denies any wheezing non productive cough. Wife here  He recognizes no change since last visit, as the season progresses. No acute events. Occasionally uses rescue inhaler. PFT:02/09/2013- Severe emphysema with insignificant response to bronchodilator. FEC 2.47/67%, FEV1 1.10/47%, FEV1/FVC 0.45. RV 154%, DLCO 41%. 6 minute walk test 02/09/2013-96%, 86%, 93%, 144 m. Marked shortness of breath, had to stop after 4 minutes.  03/31/13- 9 yoM former smoker ( 4 ppd/ 120 pk yrs) referred by Dr Harlene Ramus for COPD FOLLOWS FOR: Review ONO with patient; Uses O2 24/7; as long as using O2 patient feels pretty good. Wife here. He got tired of tripping over O2 line and bought his own portable  concentrator. 2L/Min. ONOX 02/16/13 Pos a1AT 02/09/13- Normal MM 168 CT chest 01/25/13 IMPRESSION:  1. No findings to suggest an interstitial lung disease at this  time.  2. Mild diffuse bronchial wall thickening with moderate  centrilobular and mild paraseptal emphysema; imaging findings  compatible with underlying COPD.  3. Nonspecific pulmonary nodules in the left lower lobe measuring  only 6 mm in size. Given the obvious smoking related changes in  the lungs, the patient is at high risk for bronchogenic carcinoma  and a follow-up chest CT at 6-12 months is recommended. This  recommendation follows the consensus statement: Guidelines for  Management of Small Pulmonary Nodules Detected on CT Scans: A  Statement from the [employer] as published in Radiology  2005; 237:395-400.  4. The appearance of the liver suggests some early changes of  cirrhosis.  5. Atherosclerosis, including left main and three-vessel coronary  artery disease. Status post median sternotomy for CABG with a  LIMA to the LAD.  6. Additional incidental findings, as above.  Original Report Authenticated By: Vinnie Langton, M.D.  08/01/13- 89 yoM former smoker ( 4 ppd/ 120 pk yrs) referred by Dr Harlene Ramus for COPD, complicated by CAD/ CABG FOLLOWS FOR: SOB if moving around too much; denies any wheezing. Had to stop Pulmonary Rehab for a while-has cath in for urine Breathing has been comfortable. 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 Now on leave from pulmonary rehabilitation. Cardiologist and nephrologist following him. Ankles swell. CT 07/13/13 IMPRESSION:  1. Small pulmonary nodules in the left lower lobe are unchanged  from previous exam. These have remained stable  since 09/26/2006  and likely benign.  2. Very small, partially loculated left pleural effusion is  slightly increased in volume from previous exam.  3. Emphysema.  Original Report Authenticated By: Signa Kell, M.D.   ROS-see  HPI Constitutional:   No-   weight loss, night sweats, fevers, chills, fatigue, lassitude. HEENT:   No-  headaches, difficulty swallowing, tooth/dental problems, sore throat,       No-  sneezing, itching, ear ache, nasal congestion, post nasal drip,  CV:  No-   chest pain, orthopnea, PND, +swelling in lower extremities, anasarca, dizziness, palpitations Resp:   +shortness of breath with exertion or at rest.              No-   productive cough,  No non-productive cough,  No- coughing up of blood.              No-   change in color of mucus.  No- wheezing.   Skin: No-   rash or lesions. GI:  No-   heartburn, indigestion, abdominal pain, nausea, vomiting,  GU:  MS:  No-   joint pain or swelling.   Neuro-     nothing unusual Psych:  No- change in mood or affect. No depression or anxiety.  No memory loss.  OBJ- Physical Exam General- Alert, Oriented, Affect-appropriate, Distress- none acute. O2 2L pulse-sat 98% Skin- rash-none, lesions- none, excoriation- none Lymphadenopathy- none Head- atraumatic            Eyes- Gross vision intact, PERRLA, conjunctivae and secretions clear            Ears- +Hearing aids            Nose- Clear, no-Septal dev, mucus, polyps, erosion, perforation             Throat- Mallampati III , mucosa clear , drainage- none, tonsils- atrophic Neck- flexible , trachea midline, no stridor , thyroid nl, carotid no bruit Chest - symmetrical excursion , unlabored           Heart/CV- RRR , no murmur , no gallop  , no rub, nl s1 s2                           - JVD-none , edema+2-3, stasis changes- none, varices- none           Lung- + distant, + trace wheeze/ crackles, cough- none , dullness+both bases, rub- none           Chest wall-  Abd-  Br/ Gen/ Rectal- Not done, not indicated Extrem- cyanosis- none, clubbing, none, atrophy- none, strength- nl. Cane. Neuro- grossly intact to observation

## 2013-08-01 NOTE — Patient Instructions (Addendum)
Ok to continue O2 at 2L  Please call as needed

## 2013-08-03 ENCOUNTER — Encounter (HOSPITAL_COMMUNITY)
Admission: RE | Admit: 2013-08-03 | Discharge: 2013-08-03 | Disposition: A | Discharge status: Home / Self Care | Payer: Managed Care, Other (non HMO) | Source: Ambulatory Visit | Attending: Internal Medicine | Admitting: Internal Medicine

## 2013-08-03 NOTE — Progress Notes (Signed)
30 Day Psychosocial Assessment  Patient has on attended 2 exercise sessions since the start of our Pulmonary Rehab program, which began 07/13/2013.  He also attended the 07/20/2013 class.  Since, he has developed a urinary tract infection which has required antibiotics and patient has developed urinary incontinence.  Patient has since been wearing a urinary drainage bag system which has made him uncomfortable to exercise in the rehab program.  He will return when this issue has resolved.  His wife does bring patient to the sessions and is his main support system.  Hopefully, when his attendance improves he will have benefits that can be felt.  Unable to address other psycho-social aspects.

## 2013-08-08 ENCOUNTER — Encounter (HOSPITAL_COMMUNITY)
Admission: RE | Admit: 2013-08-08 | Discharge: 2013-08-08 | Disposition: A | Discharge status: Home / Self Care | Payer: Managed Care, Other (non HMO) | Source: Ambulatory Visit | Attending: Internal Medicine | Admitting: Internal Medicine

## 2013-08-08 NOTE — Progress Notes (Signed)
I contacted patient at home this morning.  He is seeing Dr. Lacy Duverney, a kidney specialists for a bladder issue.  Patient states "my bladder is not working and I have to wear a leg bag".  Patient is very uncomfortable exercising with the foley catheter in place.  He thinks he will need to have this for a month or more.  Patient would like to drop or be discharged from the pulmonary rehab program and I agree.  He is aware to contact his pulmonary physician when he feels he can return to the program.

## 2013-08-10 ENCOUNTER — Encounter (HOSPITAL_COMMUNITY): Payer: Managed Care, Other (non HMO)

## 2013-08-13 NOTE — Assessment & Plan Note (Signed)
Chronic hypoxic respiratory failure without acute exacerbation. No changes identified now

## 2013-08-13 NOTE — Assessment & Plan Note (Signed)
On followup this has remained stable and looks like benign scarring

## 2013-08-15 ENCOUNTER — Encounter (HOSPITAL_COMMUNITY): Payer: Managed Care, Other (non HMO)

## 2013-08-17 ENCOUNTER — Encounter (HOSPITAL_COMMUNITY): Payer: Managed Care, Other (non HMO)

## 2013-08-22 ENCOUNTER — Encounter (HOSPITAL_COMMUNITY): Payer: Managed Care, Other (non HMO)

## 2013-08-24 ENCOUNTER — Encounter (HOSPITAL_COMMUNITY): Payer: Managed Care, Other (non HMO)

## 2013-08-29 ENCOUNTER — Encounter (HOSPITAL_COMMUNITY): Payer: Managed Care, Other (non HMO)

## 2013-08-31 ENCOUNTER — Encounter (HOSPITAL_COMMUNITY): Payer: Managed Care, Other (non HMO)

## 2013-09-05 ENCOUNTER — Encounter (HOSPITAL_COMMUNITY): Payer: Managed Care, Other (non HMO)

## 2013-09-07 ENCOUNTER — Encounter (HOSPITAL_COMMUNITY): Payer: Managed Care, Other (non HMO)

## 2013-09-12 ENCOUNTER — Encounter (HOSPITAL_COMMUNITY): Payer: Managed Care, Other (non HMO)

## 2013-09-14 ENCOUNTER — Encounter (HOSPITAL_COMMUNITY): Payer: Managed Care, Other (non HMO)

## 2013-09-19 ENCOUNTER — Encounter (HOSPITAL_COMMUNITY): Payer: Managed Care, Other (non HMO)

## 2013-09-20 ENCOUNTER — Encounter (HOSPITAL_COMMUNITY): Payer: Self-pay | Admitting: Emergency Medicine

## 2013-09-20 ENCOUNTER — Emergency Department (HOSPITAL_COMMUNITY)
Admission: EM | Admit: 2013-09-20 | Discharge: 2013-09-20 | Disposition: A | Discharge status: Home / Self Care | Payer: Managed Care, Other (non HMO) | Attending: Emergency Medicine | Admitting: Emergency Medicine

## 2013-09-20 DIAGNOSIS — Y846 Urinary catheterization as the cause of abnormal reaction of the patient, or of later complication, without mention of misadventure at the time of the procedure: Secondary | ICD-10-CM | POA: Insufficient documentation

## 2013-09-20 DIAGNOSIS — Z8701 Personal history of pneumonia (recurrent): Secondary | ICD-10-CM | POA: Insufficient documentation

## 2013-09-20 DIAGNOSIS — M129 Arthropathy, unspecified: Secondary | ICD-10-CM | POA: Insufficient documentation

## 2013-09-20 DIAGNOSIS — I129 Hypertensive chronic kidney disease with stage 1 through stage 4 chronic kidney disease, or unspecified chronic kidney disease: Secondary | ICD-10-CM | POA: Insufficient documentation

## 2013-09-20 DIAGNOSIS — Z79899 Other long term (current) drug therapy: Secondary | ICD-10-CM | POA: Insufficient documentation

## 2013-09-20 DIAGNOSIS — J4489 Other specified chronic obstructive pulmonary disease: Secondary | ICD-10-CM | POA: Insufficient documentation

## 2013-09-20 DIAGNOSIS — Z951 Presence of aortocoronary bypass graft: Secondary | ICD-10-CM | POA: Insufficient documentation

## 2013-09-20 DIAGNOSIS — R319 Hematuria, unspecified: Secondary | ICD-10-CM | POA: Diagnosis present

## 2013-09-20 DIAGNOSIS — Z8719 Personal history of other diseases of the digestive system: Secondary | ICD-10-CM | POA: Insufficient documentation

## 2013-09-20 DIAGNOSIS — Z794 Long term (current) use of insulin: Secondary | ICD-10-CM | POA: Insufficient documentation

## 2013-09-20 DIAGNOSIS — T83091A Other mechanical complication of indwelling urethral catheter, initial encounter: Secondary | ICD-10-CM

## 2013-09-20 DIAGNOSIS — E7800 Pure hypercholesterolemia, unspecified: Secondary | ICD-10-CM | POA: Insufficient documentation

## 2013-09-20 DIAGNOSIS — Z87891 Personal history of nicotine dependence: Secondary | ICD-10-CM | POA: Insufficient documentation

## 2013-09-20 DIAGNOSIS — J449 Chronic obstructive pulmonary disease, unspecified: Secondary | ICD-10-CM | POA: Insufficient documentation

## 2013-09-20 DIAGNOSIS — N184 Chronic kidney disease, stage 4 (severe): Secondary | ICD-10-CM | POA: Insufficient documentation

## 2013-09-20 DIAGNOSIS — Z7982 Long term (current) use of aspirin: Secondary | ICD-10-CM | POA: Insufficient documentation

## 2013-09-20 DIAGNOSIS — E119 Type 2 diabetes mellitus without complications: Secondary | ICD-10-CM | POA: Insufficient documentation

## 2013-09-20 DIAGNOSIS — Z85038 Personal history of other malignant neoplasm of large intestine: Secondary | ICD-10-CM | POA: Insufficient documentation

## 2013-09-20 DIAGNOSIS — I2510 Atherosclerotic heart disease of native coronary artery without angina pectoris: Secondary | ICD-10-CM | POA: Insufficient documentation

## 2013-09-20 LAB — URINALYSIS, ROUTINE W REFLEX MICROSCOPIC
Glucose, UA: 100 mg/dL — AB
Nitrite: NEGATIVE
Protein, ur: 100 mg/dL — AB
Specific Gravity, Urine: 1.01 (ref 1.005–1.030)

## 2013-09-20 LAB — URINE MICROSCOPIC-ADD ON

## 2013-09-20 NOTE — ED Notes (Signed)
Pt in severe pain, once cath was placed Pt pain level decreased to 1/10

## 2013-09-20 NOTE — ED Notes (Signed)
Presents with severe lower abdominal pain from "my doctor gave it to me because my bladder is out of control" last changed one month ago, no drainage from bag for 3 hours, small amount of blood in bag. Pt is agitated and hypertensive.

## 2013-09-20 NOTE — ED Provider Notes (Signed)
CSN: [id number]     Arrival date & time 09/20/13  2000 History   First MD Initiated Contact with Patient 09/20/13 2116     Chief Complaint  Patient presents with  . Urinary Retention   (Consider location/radiation/quality/duration/timing/severity/associated sxs/prior Treatment) Patient is a 79 y.o. male presenting with male genitourinary complaint. The history is provided by the patient.  Male GU Problem Presenting symptoms: no dysuria   Presenting symptoms comment:  Suprapubic pain Context: spontaneously   Relieved by: changing foley catheter. Worsened by:  Nothing tried Ineffective treatments:  None tried Associated symptoms: abdominal pain and hematuria   Associated symptoms: no diarrhea, no fever, no nausea and no vomiting   Abdominal pain:    Location:  Suprapubic   Quality:  Pressure   Severity:  Moderate   Onset quality:  Gradual   Duration:  4 hours   Timing:  Constant   Progression:  Worsening   Chronicity:  New   Past Medical History  Diagnosis Date  . COPD (chronic obstructive pulmonary disease)   . Hypertension   . CAD (coronary artery disease), autologous vein bypass graft   . High cholesterol   . Acute chest pain 08/18/2012    "cardiologist said it was not my heart" (08/18/2012)  . Pneumonia 1938  . Shortness of breath     "related to the COPD" (08/18/2012)  . Type II diabetes mellitus   . History of blood transfusion 1938  . Colon cancer   . Chronic kidney disease, stage 4 (severe)   . Arthritis     "back, hands, neck" (08/18/2012)  . History of pancreatitis ~ 1985  . Anginal pain    Past Surgical History  Procedure Laterality Date  . Appendectomy  1938  . Inguinal hernia repair  2000's    bilaterally  . Cataract extraction w/ intraocular lens  implant, bilateral  2000's  . Coronary artery bypass graft  2000's    CABG X4  . Colectomy  2000's    "cancer; prior to having double hernia operation" (08/18/2012)   Family History  Problem Relation  Age of Onset  . Heart disease Mother   . Heart disease Father    History  Substance Use Topics  . Smoking status: Former Smoker -- 4.00 packs/day for 30 years    Types: Cigarettes    Quit date: 07/13/1983  . Smokeless tobacco: Never Used  . Alcohol Use: Yes     Comment: 08/18/2012 "drank too much; stopped ~ 1985"    Review of Systems  Constitutional: Negative for fever.  HENT: Negative for drooling and rhinorrhea.   Eyes: Negative for pain.  Respiratory: Negative for cough and shortness of breath.   Cardiovascular: Negative for chest pain and leg swelling.  Gastrointestinal: Positive for abdominal pain. Negative for nausea, vomiting and diarrhea.  Genitourinary: Positive for hematuria. Negative for dysuria.  Musculoskeletal: Negative for gait problem and neck pain.  Skin: Negative for color change.  Neurological: Negative for numbness and headaches.  Hematological: Negative for adenopathy.  Psychiatric/Behavioral: Negative for behavioral problems.  All other systems reviewed and are negative.    Allergies  Codeine and Dilaudid  Home Medications   Current Outpatient Rx  Name  Route  Sig  Dispense  Refill  . albuterol (PROVENTIL HFA;VENTOLIN HFA) 108 (90 BASE) MCG/ACT inhaler   Inhalation   Inhale 2 puffs into the lungs every 6 (six) hours as needed. For wheeze or shortness of breath           .  aspirin EC 81 MG tablet   Oral   Take 81 mg by mouth daily.           Marland Kitchen docusate sodium (COLACE) 100 MG capsule   Oral   Take 100 mg by mouth daily.         Marland Kitchen doxazosin (CARDURA) 8 MG tablet   Oral   Take 8 mg by mouth at bedtime.           . Fluticasone-Salmeterol (ADVAIR) 100-50 MCG/DOSE AEPB   Inhalation   Inhale 1 puff into the lungs every 12 (twelve) hours.           . furosemide (LASIX) 40 MG tablet      2 in the morning and 1 at night         . insulin NPH (HUMULIN N,NOVOLIN N) 100 UNIT/ML injection      Sliding scale with each meal          . insulin regular (NOVOLIN R,HUMULIN R) 100 units/mL injection      Sliding scale with each meal         . isosorbide mononitrate (IMDUR) 60 MG 24 hr tablet   Oral   Take 1 tablet (60 mg total) by mouth daily.   30 tablet   0   . lipase/protease/amylase (CREON-10/PANCREASE) 12000 UNITS CPEP   Oral   Take 2 capsules by mouth 3 (three) times daily before meals.           . metoprolol (LOPRESSOR) 50 MG tablet   Oral   Take 50 mg by mouth 2 (two) times daily.           . Multiple Vitamins-Minerals (MULTIVITAMINS THER. W/MINERALS) TABS   Oral   Take 1 tablet by mouth daily.           . nitroGLYCERIN (NITROSTAT) 0.4 MG SL tablet   Sublingual   Place 0.4 mg under the tongue every 5 (five) minutes as needed for chest pain.         Marland Kitchen omeprazole (PRILOSEC) 20 MG capsule   Oral   Take 20 mg by mouth daily.           Marland Kitchen OVER THE COUNTER MEDICATION   Oral   Take 1 tablet by mouth daily. Legatrin PM          . paricalcitol (ZEMPLAR) 1 MCG capsule   Oral   Take 1 mcg by mouth daily.          . quiNINE (QUALAQUIN) 324 MG capsule      2 caps by mouth every evening         . simvastatin (ZOCOR) 10 MG tablet   Oral   Take 10 mg by mouth at bedtime.           . tamsulosin (FLOMAX) 0.4 MG CAPS capsule   Oral   Take 0.4 mg by mouth daily after supper.         . tiotropium (SPIRIVA) 18 MCG inhalation capsule   Inhalation   Place 18 mcg into inhaler and inhale daily.           . traMADol (ULTRAM) 50 MG tablet   Oral   Take 50 mg by mouth every 6 (six) hours as needed. Maximum dose= 8 tablets per day           BP 158/99  Pulse 110  Temp(Src) 97.3 F (36.3 C) (Oral)  Resp 18  SpO2 94% Physical Exam  Nursing note and vitals reviewed. Constitutional: He is oriented to person, place, and time. He appears well-developed and well-nourished.  HENT:  Head: Normocephalic and atraumatic.  Right Ear: External ear normal.  Left Ear: External ear normal.   Nose: Nose normal.  Mouth/Throat: Oropharynx is clear and moist. No oropharyngeal exudate.  Eyes: Conjunctivae and EOM are normal. Pupils are equal, round, and reactive to light.  Neck: Normal range of motion. Neck supple.  Cardiovascular: Normal rate, regular rhythm, normal heart sounds and intact distal pulses.  Exam reveals no gallop and no friction rub.   No murmur heard. Pulmonary/Chest: Effort normal and breath sounds normal. No respiratory distress. He has no wheezes.  Abdominal: Soft. Bowel sounds are normal. He exhibits no distension. There is no tenderness. There is no rebound and no guarding.  Genitourinary:  Clear/yellow urine in foley bag.   Musculoskeletal: Normal range of motion. He exhibits no edema and no tenderness.  Neurological: He is alert and oriented to person, place, and time.  Skin: Skin is warm and dry.  Psychiatric: He has a normal mood and affect. His behavior is normal.    ED Course  Procedures (including critical care time) Labs Review Labs Reviewed  URINALYSIS, ROUTINE W REFLEX MICROSCOPIC - Abnormal; Notable for the following:    APPearance CLOUDY (*)    Glucose, UA 100 (*)    Hgb urine dipstick LARGE (*)    Protein, ur 100 (*)    Leukocytes, UA MODERATE (*)    All other components within normal limits  URINE CULTURE  URINE MICROSCOPIC-ADD ON   Imaging Review No results found.  EKG Interpretation   None       MDM   1. Obstructed Foley catheter, initial encounter   2. Hematuria    9:57 PM 79 y.o. male with a history of indwelling Foley catheter due to to bladder spasms who presents with suprapubic pressure which began several hours ago. The patient noticed that the urine in his catheter bag became brownish colored today. Upon arrival to the his catheter was changed by nursing. His pain is now gone and he has yellow/clear urine flowing from the catheter. Nursing notes a blood clot obstructing the catheter. He states that he has otherwise  been well and denies any fevers, vomiting, or diarrhea. He states that he is currently on Cipro for a urinary tract infection. He was initially tachycardic and hypertensive but his heart rate and blood pressure have decreased now that the new Foley has been placed and his pain was relieved. Will get screening urinalysis.  11:10 PM: UA shows mod leuks, but rare bact. He is currently on cipro. Will have him cont cipro and f/u w/ his urologist.  I have discussed the diagnosis/risks/treatment options with the patient and family and believe the pt to be eligible for discharge home to follow-up with his urologist within the next week. We also discussed returning to the ED immediately if new or worsening sx occur. We discussed the sx which are most concerning (e.g., return of pain, dark urine, fever, pain) that necessitate immediate return. Any new prescriptions provided to the patient are listed below.  New Prescriptions   No medications on file     Junius Argyle, MD 09/21/13 [id number]

## 2013-09-21 ENCOUNTER — Encounter (HOSPITAL_COMMUNITY): Payer: Managed Care, Other (non HMO)

## 2013-09-22 LAB — URINE CULTURE

## 2013-09-26 ENCOUNTER — Encounter (HOSPITAL_COMMUNITY): Payer: Managed Care, Other (non HMO)

## 2013-09-28 ENCOUNTER — Encounter (HOSPITAL_COMMUNITY): Payer: Managed Care, Other (non HMO)

## 2013-10-03 ENCOUNTER — Encounter (HOSPITAL_COMMUNITY): Payer: Managed Care, Other (non HMO)

## 2013-10-05 ENCOUNTER — Encounter (HOSPITAL_COMMUNITY): Payer: Managed Care, Other (non HMO)

## 2013-10-10 ENCOUNTER — Encounter (HOSPITAL_COMMUNITY): Payer: Managed Care, Other (non HMO)

## 2013-10-12 ENCOUNTER — Encounter (HOSPITAL_COMMUNITY): Payer: Managed Care, Other (non HMO)

## 2013-10-17 ENCOUNTER — Encounter (HOSPITAL_COMMUNITY): Payer: Managed Care, Other (non HMO)

## 2013-10-19 ENCOUNTER — Encounter (HOSPITAL_COMMUNITY): Payer: Managed Care, Other (non HMO)

## 2013-10-24 ENCOUNTER — Encounter (HOSPITAL_COMMUNITY): Payer: Managed Care, Other (non HMO)

## 2013-10-26 ENCOUNTER — Encounter (HOSPITAL_COMMUNITY): Payer: Managed Care, Other (non HMO)

## 2013-10-31 ENCOUNTER — Encounter (HOSPITAL_COMMUNITY): Payer: Managed Care, Other (non HMO)

## 2013-11-02 ENCOUNTER — Encounter (HOSPITAL_COMMUNITY): Payer: Managed Care, Other (non HMO)

## 2014-01-10 ENCOUNTER — Encounter: Payer: Self-pay | Admitting: Interventional Cardiology

## 2014-02-02 ENCOUNTER — Ambulatory Visit (INDEPENDENT_AMBULATORY_CARE_PROVIDER_SITE_OTHER): Payer: Managed Care, Other (non HMO) | Admitting: Internal Medicine

## 2014-02-02 ENCOUNTER — Ambulatory Visit (INDEPENDENT_AMBULATORY_CARE_PROVIDER_SITE_OTHER)
Admission: RE | Admit: 2014-02-02 | Discharge: 2014-02-02 | Disposition: A | Discharge status: Home / Self Care | Payer: Managed Care, Other (non HMO) | Source: Ambulatory Visit | Attending: Internal Medicine | Admitting: Internal Medicine

## 2014-02-02 ENCOUNTER — Encounter: Payer: Self-pay | Admitting: Internal Medicine

## 2014-02-02 VITALS — BP 146/72 | HR 67 | Ht 66.0 in | Wt 154.0 lb

## 2014-02-02 DIAGNOSIS — J441 Chronic obstructive pulmonary disease with (acute) exacerbation: Secondary | ICD-10-CM

## 2014-02-02 DIAGNOSIS — J439 Emphysema, unspecified: Secondary | ICD-10-CM

## 2014-02-02 DIAGNOSIS — J438 Other emphysema: Secondary | ICD-10-CM

## 2014-02-02 NOTE — Progress Notes (Signed)
01/09/13- 75 yoM former smoker ( 4 ppd/ 120 pk yrs)   Wife here. referred by Dr Harlene Ramus for COPD.  reports gradual worsening of DOE, tightness w/ exertion and wheezing.  denies cough, congestion. Dyspnea on exertion for the past several years, noted now on stairs and routine daily activities. Little or no cough. No wheeze. Had one episode of chest pain evaluated by Dr. Tamala Julian. Unable to have heart catheterization because kidney function cannot tolerate the dye, but he says heart is functioning normally now. History of CABG 1997. Echocardiogram 11/11/2011 EF 95-28%, gr 1 diastolic dysfunction, pulmonary artery systolic pressure 31 mm. Grade 4 diabetic kidney disease, "close to dialysis". Legs remain swollen.  Past history of anemia. No history of asthma. 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 CXR 08/18/12 IMPRESSION:  Mild emphysematous changes without acute cardiopulmonary process.  Original Report Authenticated By: Arline Asp, M.D.   02/09/13- 71 yoM former smoker ( 4 ppd/ 120 pk yrs) referred by Dr Harlene Ramus for COPD Follows for: pt here to go over 32mw and pft.states has increase sob upon activity. Denies any wheezing non productive cough. Wife here  He recognizes no change since last visit, as the season progresses. No acute events. Occasionally uses rescue inhaler. PFT:02/09/2013- Severe emphysema with insignificant response to bronchodilator. FEC 2.47/67%, FEV1 1.10/47%, FEV1/FVC 0.45. RV 154%, DLCO 41%. 6 minute walk test 02/09/2013-96%, 86%, 93%, 144 m. Marked shortness of breath, had to stop after 4 minutes.  03/31/13- 9 yoM former smoker ( 4 ppd/ 120 pk yrs) referred by Dr Harlene Ramus for COPD FOLLOWS FOR: Review ONO with patient; Uses O2 24/7; as long as using O2 patient feels pretty good. Wife here. He got tired of tripping over O2 line and bought his own portable  concentrator. 2L/Min. ONOX 02/16/13 Pos a1AT 02/09/13- Normal MM 168 CT chest 01/25/13 IMPRESSION:  1. No findings to suggest an interstitial lung disease at this  time.  2. Mild diffuse bronchial wall thickening with moderate  centrilobular and mild paraseptal emphysema; imaging findings  compatible with underlying COPD.  3. Nonspecific pulmonary nodules in the left lower lobe measuring  only 6 mm in size. Given the obvious smoking related changes in  the lungs, the patient is at high risk for bronchogenic carcinoma  and a follow-up chest CT at 6-12 months is recommended. This  recommendation follows the consensus statement: Guidelines for  Management of Small Pulmonary Nodules Detected on CT Scans: A  Statement from the [employer] as published in Radiology  2005; 237:395-400.  4. The appearance of the liver suggests some early changes of  cirrhosis.  5. Atherosclerosis, including left main and three-vessel coronary  artery disease. Status post median sternotomy for CABG with a  LIMA to the LAD.  6. Additional incidental findings, as above.  Original Report Authenticated By: Vinnie Langton, M.D.  08/01/13- 89 yoM former smoker ( 4 ppd/ 120 pk yrs) referred by Dr Harlene Ramus for COPD, complicated by CAD/ CABG FOLLOWS FOR: SOB if moving around too much; denies any wheezing. Had to stop Pulmonary Rehab for a while-has cath in for urine Breathing has been comfortable. 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 Now on leave from pulmonary rehabilitation. Cardiologist and nephrologist following him. Ankles swell. CT 07/13/13 IMPRESSION:  1. Small pulmonary nodules in the left lower lobe are unchanged  from previous exam. These have remained stable  since 09/26/2006  and likely benign.  2. Very small, partially loculated left pleural effusion is  slightly increased in volume from previous exam.  3. Emphysema.  Original Report Authenticated By: Kerby Moors, M.D.  3/271/5 - 83  yoM former smoker ( 4 ppd/ 120 pk yrs) referred by Dr Harlene Ramus for COPD, complicated by CAD/ CABG FOLLOWS FOR: SOB with exertion, occasional nonprod cough.  Denies any wheezing, congestion.  Easy DOE with ADLs. Some wheeze in cold air. O2 2L w/ [employer] concentrator.  ROS-see HPI Constitutional:   No-   weight loss, night sweats, fevers, chills, fatigue, lassitude. HEENT:   No-  headaches, difficulty swallowing, tooth/dental problems, sore throat,       No-  sneezing, itching, ear ache, nasal congestion, post nasal drip,  CV:  No-   chest pain, orthopnea, PND, +swelling in lower extremities, anasarca, dizziness, palpitations Resp:   +shortness of breath with exertion or at rest.              No-   productive cough,  No non-productive cough,  No- coughing up of blood.              No-   change in color of mucus.  No- wheezing.   Skin: No-   rash or lesions. GI:  No-   heartburn, indigestion, abdominal pain, nausea, vomiting,  GU:  MS:  No-   joint pain or swelling.   Neuro-     nothing unusual Psych:  No- change in mood or affect. No depression or anxiety.  No memory loss.  OBJ- Physical Exam General- Alert, Oriented, Affect-appropriate, Distress- none acute. O2 2L pulse-sat 98% Skin- rash-none, lesions- none, excoriation- none Lymphadenopathy- none Head- atraumatic            Eyes- Gross vision intact, PERRLA, conjunctivae and secretions clear            Ears- +Hearing aids            Nose- Clear, no-Septal dev, mucus, polyps, erosion, perforation             Throat- Mallampati III , mucosa clear , drainage- none, tonsils- atrophic Neck- flexible , trachea midline, no stridor , thyroid nl, carotid no bruit Chest - symmetrical excursion , unlabored           Heart/CV- RRR , no murmur , no gallop  , no rub, nl s1 s2                           - JVD-none , edema+2-3, stasis changes- none, varices- none           Lung- + distant, clear, cough- none , dullness+both bases, rub- none            Chest wall-  Abd-  Br/ Gen/ Rectal- Not done, not indicated Extrem- cyanosis- none, clubbing, none, atrophy- none, strength- nl. Wheelchair Neuro- grossly intact to observation

## 2014-02-02 NOTE — Patient Instructions (Signed)
Order- CXR   Dx COPD  Ok to continue present meds. Since these are prescribed by Dr Radene Ou, and you are stable, we will be happy to see you back for breathing problems as needed.

## 2014-02-14 ENCOUNTER — Ambulatory Visit: Payer: Managed Care, Other (non HMO) | Admitting: Interventional Cardiology

## 2014-02-28 NOTE — Assessment & Plan Note (Signed)
Severe w/o significant progression  Since last ov. Meds ok

## 2014-03-01 ENCOUNTER — Ambulatory Visit: Payer: Managed Care, Other (non HMO) | Admitting: Interventional Cardiology

## 2014-03-04 ENCOUNTER — Encounter: Payer: Self-pay | Admitting: Interventional Cardiology

## 2014-03-07 ENCOUNTER — Other Ambulatory Visit: Payer: Self-pay

## 2014-03-07 MED ORDER — ISOSORBIDE MONONITRATE ER 60 MG PO TB24: 60.0000 mg | ORAL_TABLET | Freq: Every day | ORAL | Status: DC

## 2014-03-07 NOTE — Telephone Encounter (Signed)
Refill rqst received through My chart

## 2014-03-14 ENCOUNTER — Encounter: Payer: Self-pay | Admitting: Endocrinology

## 2014-03-14 ENCOUNTER — Ambulatory Visit (INDEPENDENT_AMBULATORY_CARE_PROVIDER_SITE_OTHER): Payer: Managed Care, Other (non HMO) | Admitting: Endocrinology

## 2014-03-14 VITALS — BP 112/60 | HR 70 | Temp 97.8°F | Resp 12 | Wt 151.0 lb

## 2014-03-14 DIAGNOSIS — IMO0001 Reserved for inherently not codable concepts without codable children: Secondary | ICD-10-CM

## 2014-03-14 DIAGNOSIS — E1165 Type 2 diabetes mellitus with hyperglycemia: Principal | ICD-10-CM

## 2014-03-14 DIAGNOSIS — N184 Chronic kidney disease, stage 4 (severe): Secondary | ICD-10-CM

## 2014-03-14 NOTE — Patient Instructions (Addendum)
Please check blood sugars at the following times:  Before breakfast daily;  alternate testing before LUNCH and  before dinner and check at 8-9 PM every day  Please bring blood sugar monitor to each visit  INSULIN: Take 22 units of NPH. Insulin before breakfast DAILY  HUMALOG insulin: Take this right before eating meals as directed: Take 4 units before breakfast daily regardless of blood sugar. Take 2 units BEFORE DINNER daily if eating any starchy food otherwise none   If the blood sugar is over 200 before breakfast or dinner may add additional 1 unit of Humalog

## 2014-03-14 NOTE — Progress Notes (Addendum)
Patient ID: Raymond [name], male   DOB: 1930-02-04, 80 y.o.   MRN: [id number]   Reason for Appointment: Consultation for Type 2 Diabetes  History of Present Illness:          Diagnosis: Type 2 diabetes mellitus, date of diagnosis:  ? 1985       Past history: He apparently has been on a regimen of NPH and regular insulin for several years. he thinks he may have been started on insulin 25 years ago but he has very little recall of his history and not clear what oral agents he had taken previously  Recent history:  Has been followed with various primary care physicians and appears to be taking the same regimen with difficulty getting adequate and consistent control He is taking a complicated regimen of NPH insulin before breakfast and also at about 10 AM based on his blood sugar level He is usually taking 25 units of NPH in the morning since with a sliding scale this is the dose he takes when glucose is over 120 Also if the blood sugar is relatively high he will take 3 units of NPH late morning also. Previously was also taking NPH at 10 PM based on the blood sugar at that time but this was stopped in late March He does not take any blood sugars before and after lunch and no insulin to cover his lunch meal Also currently not taking any coverage for his evening meal Mostly his blood sugars are higher after dinner but probably depends on his meal intake; more recently readings have been usually in the 170-240 range after supper       Last night with eating fried chicken/hamburger his blood sugar did not increase after supper; otherwise may have some carbohydrate in the form of starchy vegetables, macaroni, potato or pasta. May have some carbohydrate at lunch also at times       INSULIN regimen is described as:  10 or 25 N in a.m. plus 4 regular in am > 120 10 am: 3-8 R and 3 NPH unless blood sugar low  Glucose monitoring:  done 3 times a day         Glucometer: One Touch.      Blood Glucose  readings from home record:  PREMEAL Breakfast 10-11AM  Dinner Bedtime  Overall   Glucose range: 95-182  40-189  ?   129-281    Median:        Hypoglycemia:   mostly around 10 AM-noon and occasionally at 10 PM     Glycemic control:   Last A1c was 6.7 in 12/2013, previously 7.1 in 05/2013  No results found for this basename: HGBA1C   Lab Results  Component Value Date   CREATININE 4.35* 08/19/2012    Self-care: The diet that the patient has been following is: tries to limit simple sugars  Meals: 3 meals per day.          Exercise:  unable to do any         Dietician visit: Most recent: Several years ago.                Weight history: Wt Readings from Last 3 Encounters:  03/14/14 151 lb (68.493 kg)  02/02/14 154 lb (69.854 kg)  08/01/13 166 lb 12.8 oz (75.66 kg)      Medication List       This list is accurate as of: 03/14/14  2:10 PM.  Always use your most recent  med list.               albuterol 108 (90 BASE) MCG/ACT inhaler  Commonly known as:  PROVENTIL HFA;VENTOLIN HFA  - Inhale 2 puffs into the lungs every 6 (six) hours as needed. For wheeze or shortness of breath  -      aspirin EC 81 MG tablet  Take 81 mg by mouth daily.     docusate sodium 100 MG capsule  Commonly known as:  COLACE  Take 100 mg by mouth daily.     doxazosin 8 MG tablet  Commonly known as:  CARDURA  Take 8 mg by mouth at bedtime.     epoetin alfa 10000 UNIT/ML injection  Commonly known as:  EPOGEN,PROCRIT  10,000 Units every 14 (fourteen) days.     Fluticasone-Salmeterol 100-50 MCG/DOSE Aepb  Commonly known as:  ADVAIR  Inhale 1 puff into the lungs every 12 (twelve) hours.     furosemide 40 MG tablet  Commonly known as:  LASIX  80 mg 2 (two) times daily.     insulin NPH Human 100 UNIT/ML injection  Commonly known as:  HUMULIN N,NOVOLIN N  Sliding scale with each meal     insulin regular 100 units/mL injection  Commonly known as:  NOVOLIN R,HUMULIN R  Sliding scale with  each meal     isosorbide mononitrate 60 MG 24 hr tablet  Commonly known as:  IMDUR  Take 1 tablet (60 mg total) by mouth daily.     lipase/protease/amylase 12000 UNITS Cpep capsule  Commonly known as:  CREON-12/PANCREASE  Take 2 capsules by mouth 3 (three) times daily before meals.     metoprolol 50 MG tablet  Commonly known as:  LOPRESSOR  Take 50 mg by mouth 2 (two) times daily.     multivitamins ther. w/minerals Tabs tablet  Take 1 tablet by mouth daily.     nitroGLYCERIN 0.4 MG SL tablet  Commonly known as:  NITROSTAT  Place 0.4 mg under the tongue every 5 (five) minutes as needed for chest pain.     omeprazole 20 MG capsule  Commonly known as:  PRILOSEC  Take 20 mg by mouth daily.     ONETOUCH VERIO test strip  Generic drug:  glucose blood  Test 3 times daily     OVER THE COUNTER MEDICATION  Take 1 tablet by mouth daily. Legatrin PM     paricalcitol 1 MCG capsule  Commonly known as:  ZEMPLAR  Take 1 mcg by mouth every other day.     quiNINE 324 MG capsule  Commonly known as:  QUALAQUIN  2 caps by mouth every evening     simvastatin 10 MG tablet  Commonly known as:  ZOCOR  Take 10 mg by mouth at bedtime.     tiotropium 18 MCG inhalation capsule  Commonly known as:  SPIRIVA  Place 18 mcg into inhaler and inhale daily.     traMADol 50 MG tablet  Commonly known as:  ULTRAM  Take 50 mg by mouth every 6 (six) hours as needed. Maximum dose= 8 tablets per day        Allergies:  Allergies  Allergen Reactions  . Codeine Nausea And Vomiting  . Dilaudid [Hydromorphone Hcl] Nausea And Vomiting    "I get deathly sick"; diaphoresis    Past Medical History  Diagnosis Date  . COPD (chronic obstructive pulmonary disease)   . Hypertension   . CAD (coronary artery disease), autologous vein bypass graft   .  High cholesterol   . Acute chest pain 08/18/2012    "cardiologist said it was not my heart" (08/18/2012)  . Pneumonia 1938  . Shortness of breath      "related to the COPD" (08/18/2012)  . Type II diabetes mellitus   . History of blood transfusion 1938  . Colon cancer   . Chronic kidney disease, stage 4 (severe)   . Arthritis     "back, hands, neck" (08/18/2012)  . History of pancreatitis ~ 1985  . Anginal pain     Past Surgical History  Procedure Laterality Date  . Appendectomy  1938  . Inguinal hernia repair  2000's    bilaterally  . Cataract extraction w/ intraocular lens  implant, bilateral  2000's  . Coronary artery bypass graft  2000's    CABG X4  . Colectomy  2000's    "cancer; prior to having double hernia operation" (08/18/2012)    Family History  Problem Relation Age of Onset  . Heart disease Mother   . Heart disease Father     Social History:  reports that he quit smoking about 30 years ago. His smoking use included Cigarettes. He has a 120 pack-year smoking history. He has never used smokeless tobacco. He reports that he drinks alcohol. He reports that he does not use illicit drugs.    Review of Systems       Lipids: Last LDL 62 in 12/2013 treated with low dose simvastatin       No results found for this basename: CHOL, HDL, LDLCALC, LDLDIRECT, TRIG, CHOLHDL      Followed by nephrologist for stage IV chronic kidney disease     Skin: No rash or infections     Thyroid:  No  unusual fatigue or history of thyroid disease.     The blood pressure has been controlled with doxazosin, not on an ACE inhibitor     Has had significant swelling of feet, recently improved with increasing Lasix by nephrologist.     No shortness of breath or chest pain     Bowel habits: Normal.     Pancreatitis history 25 years ago. He has been told to have calcific pancreatitis.   Previously had loose stools which is now controlled with taking pancreatic enzymes with each meal            No frequency of urination or nocturia       No joint  pains.     No history of Numbness, tingling or burning in feet     LABS:  No  visits with results within 1 Week(s) from this visit. Latest known visit with results is:  Admission on 09/20/2013, Discharged on 09/20/2013  Component Date Value Ref Range Status  . Color, Urine 09/20/2013 YELLOW  YELLOW Final  . APPearance 09/20/2013 CLOUDY* CLEAR Final  . Specific Gravity, Urine 09/20/2013 1.010  1.005 - 1.030 Final  . pH 09/20/2013 7.0  5.0 - 8.0 Final  . Glucose, UA 09/20/2013 100* NEGATIVE mg/dL Final  . Hgb urine dipstick 09/20/2013 LARGE* NEGATIVE Final  . Bilirubin Urine 09/20/2013 NEGATIVE  NEGATIVE Final  . Ketones, ur 09/20/2013 NEGATIVE  NEGATIVE mg/dL Final  . Protein, ur 09/20/2013 100* NEGATIVE mg/dL Final  . Urobilinogen, UA 09/20/2013 0.2  0.0 - 1.0 mg/dL Final  . Nitrite 09/20/2013 NEGATIVE  NEGATIVE Final  . Leukocytes, UA 09/20/2013 MODERATE* NEGATIVE Final  . Squamous Epithelial / LPF 09/20/2013 RARE  RARE Final  . WBC, UA 09/20/2013 3-6  <  3 WBC/hpf Final  . RBC / HPF 09/20/2013 TOO NUMEROUS TO COUNT  <3 RBC/hpf Final  . Bacteria, UA 09/20/2013 RARE  RARE Final  . Specimen Description 09/20/2013 URINE, CATHETERIZED   Final  . Special Requests 09/20/2013 NONE   Final  . Culture  Setup Time 09/20/2013    Final                   Value:09/21/2013 04:53                         Performed at [facility]  . Culture 09/20/2013    Final                   Value:NO GROWTH                         Performed at [facility]  . Report Status 09/20/2013 09/22/2013 FINAL   Final    Physical Examination:  BP 112/60  Pulse 70  Temp(Src) 97.8 F (36.6 C) (Oral)  Resp 12  Wt 151 lb (68.493 kg)  SpO2 94%  GENERAL:         Patient is averagely built and nourished, alert and conversant normally  HEENT:         Eye exam shows normal external appearance. Fundus exam shows no retinopathy.  Oral exam shows normal mucosa .  NECK:         General:  Neck exam shows no lymphadenopathy. Carotids are normal to palpation and  soft bruits heard  Bilaterally.  Thyroid is not enlarged and no nodules felt.   LUNGS:         Chest is symmetrical. Lungs are clear to auscultation.Marland Kitchen   HEART:         Heart sounds:  S1 and S2 are normal. No murmurs or clicks heard., no S3 or S4.   ABDOMEN:   There is no distention present. Liver and spleen are not palpable. No other mass or tenderness present.  EXTREMITIES:     There is no edema. No skin lesions present.Marland Kitchen  NEUROLOGICAL:   Vibration sense is   moderately reduced in toes. Ankle jerks are absent bilaterally.          Diabetic foot exam:  as in the foot exam section MUSCULOSKELETAL:       There is no enlargement or deformity of the joints. Spine is normal to inspection.Marland Kitchen   SKIN:   has patchy greenish pigmentation on the fingers    ASSESSMENT:  Diabetes type 2, uncontrolled The patient is on an un-physiological regimen of NPH and regular insulin based on blood sugar level rather than steady basal bolus coverage Because of his age he does not need aggressive control and the main focus would be to avoid hypoglycemia His A1c has been in a good range He tends to have hypoglycemia because of inappropriate timing of his insulin doses as well as prolonged action of regular insulin in the morning and late morning peak of the NPH Currently appears to be using mostly 25 units of NPH in the morning with fair control of his blood sugars in the morning and also evening His hyperglycemia appears to be occurring mostly in the evening after his meal usually when he has any significant amount of carbohydrate   Hypoglycemia: This recently has been occurring mostly late morning or before lunch and is caused by his regular insulin  and NPH insulin having combined effect at that time  Complications:probable nephropathy. Unknown status of retinopathy. Also has coronary artery disease  Chronic kidney disease: This is significant and followed by nephrologist  PLAN:   Stop regular insulin and start using NovoLog insulin  for mealtime coverage  He was started with a sample of NovoLog insulin to take 4 units before breakfast and 2 units before supper. He can skip his evening dose if his not eating any only a small amount of carbohydrate  He would take a steady dose of 22 units of NPH in the morning and will watch his blood sugars with this  Encouraged him to check blood sugars before and after lunch also periodically as well as some readings at by rotation at other times instead of just in the morning, midmorning and bedtime    followup in 3 weeks to review home blood sugars with monitor download    Elayne Snare 03/14/2014, 2:10 PM   Note: This office note was prepared with Dragon voice recognition system technology. Any transcriptional errors that result from this process are unintentional.

## 2014-03-15 ENCOUNTER — Other Ambulatory Visit: Payer: Self-pay

## 2014-03-15 MED ORDER — NITROGLYCERIN 0.4 MG SL SUBL: 0.4000 mg | SUBLINGUAL_TABLET | SUBLINGUAL | Status: AC | PRN

## 2014-03-21 ENCOUNTER — Ambulatory Visit: Payer: Managed Care, Other (non HMO) | Admitting: Nutrition

## 2014-03-23 ENCOUNTER — Encounter: Payer: Self-pay | Admitting: Endocrinology

## 2014-03-27 ENCOUNTER — Encounter: Payer: Self-pay | Admitting: Interventional Cardiology

## 2014-03-27 ENCOUNTER — Ambulatory Visit (INDEPENDENT_AMBULATORY_CARE_PROVIDER_SITE_OTHER): Payer: Managed Care, Other (non HMO) | Admitting: Interventional Cardiology

## 2014-03-27 VITALS — BP 142/64 | HR 74 | Wt 151.0 lb

## 2014-03-27 DIAGNOSIS — J438 Other emphysema: Secondary | ICD-10-CM

## 2014-03-27 DIAGNOSIS — E785 Hyperlipidemia, unspecified: Secondary | ICD-10-CM

## 2014-03-27 DIAGNOSIS — I3481 Nonrheumatic mitral (valve) annulus calcification: Secondary | ICD-10-CM

## 2014-03-27 DIAGNOSIS — I359 Nonrheumatic aortic valve disorder, unspecified: Secondary | ICD-10-CM

## 2014-03-27 DIAGNOSIS — I10 Essential (primary) hypertension: Secondary | ICD-10-CM

## 2014-03-27 DIAGNOSIS — N184 Chronic kidney disease, stage 4 (severe): Secondary | ICD-10-CM

## 2014-03-27 DIAGNOSIS — J439 Emphysema, unspecified: Secondary | ICD-10-CM

## 2014-03-27 DIAGNOSIS — I350 Nonrheumatic aortic (valve) stenosis: Secondary | ICD-10-CM

## 2014-03-27 DIAGNOSIS — I059 Rheumatic mitral valve disease, unspecified: Secondary | ICD-10-CM

## 2014-03-27 DIAGNOSIS — I2510 Atherosclerotic heart disease of native coronary artery without angina pectoris: Secondary | ICD-10-CM

## 2014-03-27 NOTE — Progress Notes (Signed)
Patient Raymond: Raymond [name], male   DOB: 10-26-1930, 80 y.o.   MRN: [id number]    [address], [address] [place], [place]  [address] Phone: [phone] Fax:  [phone]  Date:  03/27/2014   Raymond:  Raymond [name], DOB 1930-08-20, MRN [id number]  PCP:  Milagros Evener, MD   ASSESSMENT:  1. Coronary atherosclerosis with prior coronary bypass grafting, class III angina, stable 2. Chronic combined systolic and diastolic heart failure, stable 3. Hypertension, controlled 4. Chronic kidney disease, stage III-IV 5. Mild Aortic stenosis and significant mitral annular calcification  PLAN:  1. In absence of cardiovascular symptoms, we will not make any changes. Increasing the patient's furosemide doses led to dramatic improvement in both angina and heart failure complaints. There have long discussion concerning the use of sublingual nitroglycerin. 2. We have decided not to perform an echocardiogram to assess aortic valve in absence of symptoms. Last echo demonstrated mildly and mitral annular calcification 3. We discussed invasive definitive therapy for coronary disease and aortic valve disease and he would still be interested but is not willing to have any evaluation that requires contrast at this time (fears dialysis). His best opinion is that he will have to deal with that at the time that further cardiac management becomes necessary. He has not ruled out any options.   SUBJECTIVE: Raymond [name] is a 80 y.o. male who has angina 3-6 times per month. Discomfort is relieved by nitroglycerin. An increase in furosemide as led to dramatic decrease in lower extremity swelling and dyspnea. He has not had syncope. He denies palpitations and rapid heart rate. He has not had syncope. Overall he feels stable.   Wt Readings from Last 3 Encounters:  03/27/14 151 lb (68.493 kg)  03/14/14 151 lb (68.493 kg)  02/02/14 154 lb (69.854 kg)     Past Medical History  Diagnosis Date  . COPD  (chronic obstructive pulmonary disease)   . Hypertension   . CAD (coronary artery disease), autologous vein bypass graft   . High cholesterol   . Acute chest pain 08/18/2012    "cardiologist said it was not my heart" (08/18/2012)  . Pneumonia 1938  . Shortness of breath     "related to the COPD" (08/18/2012)  . Type II diabetes mellitus   . History of blood transfusion 1938  . Colon cancer   . Chronic kidney disease, stage 4 (severe)   . Arthritis     "back, hands, neck" (08/18/2012)  . History of pancreatitis ~ 1985  . Anginal pain     Current Outpatient Prescriptions  Medication Sig Dispense Refill  . albuterol (PROVENTIL HFA;VENTOLIN HFA) 108 (90 BASE) MCG/ACT inhaler Inhale 2 puffs into the lungs every 6 (six) hours as needed. For wheeze or shortness of breath        . aspirin EC 81 MG tablet Take 81 mg by mouth daily.        Marland Kitchen docusate sodium (COLACE) 100 MG capsule Take 100 mg by mouth daily.      Marland Kitchen doxazosin (CARDURA) 4 MG tablet Take 4 mg by mouth daily.      Marland Kitchen epoetin alfa (EPOGEN,PROCRIT) 56433 UNIT/ML injection 10,000 Units every 14 (fourteen) days.      . Fluticasone-Salmeterol (ADVAIR) 100-50 MCG/DOSE AEPB Inhale 1 puff into the lungs every 12 (twelve) hours.        . furosemide (LASIX) 40 MG tablet 80 mg 2 (two) times daily.       . insulin  NPH (HUMULIN N,NOVOLIN N) 100 UNIT/ML injection Sliding scale with each meal      . insulin regular (NOVOLIN R,HUMULIN R) 100 units/mL injection Sliding scale with each meal      . isosorbide mononitrate (IMDUR) 60 MG 24 hr tablet Take 1 tablet (60 mg total) by mouth daily.  30 tablet  1  . lipase/protease/amylase (CREON-10/PANCREASE) 12000 UNITS CPEP 2 before meals (6 daily)      . metoprolol (LOPRESSOR) 50 MG tablet Take 50 mg by mouth 2 (two) times daily.        . Multiple Vitamins-Minerals (MULTIVITAMINS THER. W/MINERALS) TABS Take 1 tablet by mouth daily.        . nitroGLYCERIN (NITROSTAT) 0.4 MG SL tablet Place 1 tablet (0.4  mg total) under the tongue every 5 (five) minutes as needed for chest pain.  25 tablet  1  . omeprazole (PRILOSEC) 20 MG capsule Take 20 mg by mouth daily.        Glory Rosebush VERIO test strip Test 3 times daily      . OVER THE COUNTER MEDICATION Take 1 tablet by mouth daily. Legatrin PM      . paricalcitol (ZEMPLAR) 1 MCG capsule Take 1 mcg by mouth every other day.       . quiNINE (QUALAQUIN) 324 MG capsule 2 caps by mouth every evening      . simvastatin (ZOCOR) 10 MG tablet Take 10 mg by mouth at bedtime.        Marland Kitchen tiotropium (SPIRIVA) 18 MCG inhalation capsule Place 18 mcg into inhaler and inhale daily.        . traMADol (ULTRAM) 50 MG tablet Take 50 mg by mouth every 6 (six) hours as needed. Maximum dose= 8 tablets per day        No current facility-administered medications for this visit.    Allergies:    Allergies  Allergen Reactions  . Codeine Nausea And Vomiting  . Dilaudid [Hydromorphone Hcl] Nausea And Vomiting    "I get deathly sick"; diaphoresis    Social History:  The patient  reports that he quit smoking about 30 years ago. His smoking use included Cigarettes. He has a 120 pack-year smoking history. He has never used smokeless tobacco. He reports that he drinks alcohol. He reports that he does not use illicit drugs.   ROS:  Please see the history of present illness.   Appetite is stable. He is on Procrit for anemia. He is followed up to kill he goes per for his kidney disease.   All other systems reviewed and negative.   OBJECTIVE: VS:  BP 142/64  Pulse 74  Wt 151 lb (68.493 kg) Well nourished, well developed, in no acute distress, pale, chronically ill-appearing HEENT: normal Neck: JVD flat. Carotid bruit bilateral transmitted bruits  Cardiac:  normal S1, S2; RRR;  3/6 crescendo decrescendo murmur of aortic stenosis Lungs:  clear to auscultation bilaterally, no wheezing, rhonchi or rales Abd: soft, nontender, no hepatomegaly Ext: Edema trace to 1+ bilateral edema.  Pulses absent Skin: warm and dry Neuro:  CNs 2-12 intact, no focal abnormalities noted  EKG:  Normal sinus rhythm, prominent voltage, first-degree AV block, vertical axis rightward axis       Signed, Illene Labrador III, MD 03/27/2014 4:23 PM

## 2014-03-27 NOTE — Patient Instructions (Signed)
Your physician recommends that you continue on your current medications as directed. Please refer to the Current Medication list given to you today.  Call if cardiac symptoms develop. [phone]  Your physician wants you to follow-up in: 1 year You will receive a reminder letter in the mail two months in advance. If you don't receive a letter, please call our office to schedule the follow-up appointment.

## 2014-04-20 ENCOUNTER — Ambulatory Visit (INDEPENDENT_AMBULATORY_CARE_PROVIDER_SITE_OTHER): Payer: Managed Care, Other (non HMO) | Admitting: Endocrinology

## 2014-04-20 ENCOUNTER — Encounter: Payer: Self-pay | Admitting: Endocrinology

## 2014-04-20 VITALS — BP 126/84 | HR 67 | Temp 98.2°F | Resp 16 | Wt 150.0 lb

## 2014-04-20 DIAGNOSIS — IMO0001 Reserved for inherently not codable concepts without codable children: Secondary | ICD-10-CM

## 2014-04-20 DIAGNOSIS — E1165 Type 2 diabetes mellitus with hyperglycemia: Principal | ICD-10-CM

## 2014-04-20 DIAGNOSIS — I200 Unstable angina: Secondary | ICD-10-CM

## 2014-04-20 NOTE — Progress Notes (Signed)
Patient ID: Raymond [name], male   DOB: [birth year], 80 y.o.   MRN: [id number]   Reason for Appointment: Consultation for Type 2 Diabetes  History of Present Illness:          Diagnosis: Type 2 diabetes mellitus, date of diagnosis:  ? 1985       Past history: He apparently has been on a regimen of NPH and regular insulin for several years. He does not give a good history and most likely has been on insulin for 25 years Has been followed with various primary care physicians and appears to be taking the same regimen without getting adequate and consistent control  Recent history:   Before his consultation he was taking a complicated regimen of NPH insulin before breakfast and also at about 10 AM based on his blood sugar level. Was taking 25 units of NPH in the morning since with a sliding scale when glucose over 120 Also if the blood sugar is relatively high he will take 3 units of NPH late morning also. Previously was also taking NPH at 10 PM based on the blood sugar at that time but this was stopped in late March Also previously not taking any coverage for his evening meal  Because of his inconsistent blood sugars and tendency to hypoglycemia in the late morning his insulin doses were changed and his NPH at bedtime was stopped Also he was told to take only NovoLog for mealtime coverage using 4 units in the morning and also was started on 2 units at suppertime unless not eating carbohydrate He was given a single dose of NPH in the morning before breakfast only  With his new regimen his blood sugars appear to be overall better although still fluctuating Blood sugars are significantly higher after supper time with average readings over 200 even with his 2 units before eating Morning sugars are somewhat variable but as low as 106 and overall better Only today has had mild hypoglycemia midmorning otherwise readings have been good He is pleased with his better blood sugar patterns        INSULIN regimen is described as: 22 N in a.m. plus 4 NovoLog. NovoLog 2 units before supper  Glucose monitoring:  done 3 times a day         Glucometer: One Touch.      Blood Glucose readings from home record:  PREMEAL Breakfast 10 AM 10 PM Bedtime Overall  Glucose range: 106-222 59-206 125-364    median: 176 162 250  176   Hypoglycemia:  only once today with glucose 59 at 10:30 AM  Glycemic control:   Last A1c was 6.7 in 12/2013, previously 7.1 in 05/2013  No results found for this basename: HGBA1C   Lab Results  Component Value Date   CREATININE 4.35* 08/19/2012    Self-care: The diet that the patient has been following is: tries to limit simple sugars  Meals: 3 meals per day. Bfst 6 am        Exercise:  unable to do any         Dietician visit: Most recent: Several years ago.                Weight history: Wt Readings from Last 3 Encounters:  04/20/14 150 lb (68.04 kg)  03/27/14 151 lb (68.493 kg)  03/14/14 151 lb (68.493 kg)      Medication List       This list is accurate as of: 04/20/14  1:57  PM.  Always use your most recent med list.               albuterol 108 (90 BASE) MCG/ACT inhaler  Commonly known as:  PROVENTIL HFA;VENTOLIN HFA  - Inhale 2 puffs into the lungs every 6 (six) hours as needed. For wheeze or shortness of breath  -      aspirin EC 81 MG tablet  Take 81 mg by mouth daily.     BD INSULIN SYRINGE ULTRAFINE 31G X 5/16" 0.3 ML Misc  Generic drug:  Insulin Syringe-Needle U-100     docusate sodium 100 MG capsule  Commonly known as:  COLACE  Take 100 mg by mouth daily.     doxazosin 4 MG tablet  Commonly known as:  CARDURA  Take 4 mg by mouth daily.     epoetin alfa 10000 UNIT/ML injection  Commonly known as:  EPOGEN,PROCRIT  10,000 Units every 14 (fourteen) days.     Fluticasone-Salmeterol 100-50 MCG/DOSE Aepb  Commonly known as:  ADVAIR  Inhale 1 puff into the lungs every 12 (twelve) hours.     furosemide 40 MG tablet   Commonly known as:  LASIX  80 mg 2 (two) times daily.     insulin NPH Human 100 UNIT/ML injection  Commonly known as:  HUMULIN N,NOVOLIN N  Sliding scale with each meal     insulin regular 100 units/mL injection  Commonly known as:  NOVOLIN R,HUMULIN R  Sliding scale with each meal     isosorbide mononitrate 60 MG 24 hr tablet  Commonly known as:  IMDUR  Take 1 tablet (60 mg total) by mouth daily.     lipase/protease/amylase 12000 UNITS Cpep capsule  Commonly known as:  CREON-12/PANCREASE  2 before meals (6 daily)     metoprolol 50 MG tablet  Commonly known as:  LOPRESSOR  Take 50 mg by mouth 2 (two) times daily.     multivitamins ther. w/minerals Tabs tablet  Take 1 tablet by mouth daily.     nitroGLYCERIN 0.4 MG SL tablet  Commonly known as:  NITROSTAT  Place 1 tablet (0.4 mg total) under the tongue every 5 (five) minutes as needed for chest pain.     omeprazole 20 MG capsule  Commonly known as:  PRILOSEC  Take 20 mg by mouth daily.     ONETOUCH VERIO test strip  Generic drug:  glucose blood  Test 3 times daily     OVER THE COUNTER MEDICATION  Take 1 tablet by mouth daily. Legatrin PM     paricalcitol 1 MCG capsule  Commonly known as:  ZEMPLAR  Take 1 mcg by mouth every other day.     quiNINE 324 MG capsule  Commonly known as:  QUALAQUIN  2 caps by mouth every evening     simvastatin 10 MG tablet  Commonly known as:  ZOCOR  Take 10 mg by mouth at bedtime.     tiotropium 18 MCG inhalation capsule  Commonly known as:  SPIRIVA  Place 18 mcg into inhaler and inhale daily.     traMADol 50 MG tablet  Commonly known as:  ULTRAM  Take 50 mg by mouth every 6 (six) hours as needed. Maximum dose= 8 tablets per day        Allergies:  Allergies  Allergen Reactions  . Codeine Nausea And Vomiting  . Dilaudid [Hydromorphone Hcl] Nausea And Vomiting    "I get deathly sick"; diaphoresis    Past Medical History  Diagnosis  Date  . COPD (chronic obstructive  pulmonary disease)   . Hypertension   . CAD (coronary artery disease), autologous vein bypass graft   . High cholesterol   . Acute chest pain 08/18/2012    "cardiologist said it was not my heart" (08/18/2012)  . Pneumonia 1938  . Shortness of breath     "related to the COPD" (08/18/2012)  . Type II diabetes mellitus   . History of blood transfusion 1938  . Colon cancer   . Chronic kidney disease, stage 4 (severe)   . Arthritis     "back, hands, neck" (08/18/2012)  . History of pancreatitis ~ 1985  . Anginal pain     Past Surgical History  Procedure Laterality Date  . Appendectomy  1938  . Inguinal hernia repair  2000's    bilaterally  . Cataract extraction w/ intraocular lens  implant, bilateral  2000's  . Coronary artery bypass graft  2000's    CABG X4  . Colectomy  2000's    "cancer; prior to having double hernia operation" (08/18/2012)    Family History  Problem Relation Age of Onset  . Heart disease Mother   . Heart disease Father     Social History:  reports that he quit smoking about 30 years ago. His smoking use included Cigarettes. He has a 120 pack-year smoking history. He has never used smokeless tobacco. He reports that he drinks alcohol. He reports that he does not use illicit drugs.    Review of Systems       Lipids: Last LDL 62 in 12/2013 treated with low dose simvastatin      No results found for this basename: CHOL,  HDL,  LDLCALC,  LDLDIRECT,  TRIG,  CHOLHDL      Followed by nephrologist for stage IV chronic kidney disease     The blood pressure has been controlled with doxazosin, not on an ACE inhibitor      Pancreatitis history 25 years ago. He has been told to have calcific pancreatitis.   Previously had loose stools which is now controlled with taking pancreatic enzymes with each meal     LABS:  None recently  Physical Examination:  BP 126/84  Pulse 67  Temp(Src) 98.2 F (36.8 C)  Resp 16  Wt 150 lb (68.04 kg)  SpO2  93%   ASSESSMENT:  Diabetes type 2, uncontrolled The patient is doing significantly better with changing his insulin regimen completely and also simplifying the dosage as well as adjustment process Blood sugar targets are somewhat higher than usual because of his age and multiple medical problems and also need to avoid any hypoglycemia With once a day NPH and mealtime coverage taken at breakfast and suppertime his blood sugars are fairly good Most of his high readings appear to be after supper and these maybe carried lower overnight also sometimes   Hypoglycemia: This recently has been minimal and only low normal readings today without symptoms at 10:30 a.m.  PLAN:   We'll reduce his NPH insulin, increase the NovoLog dose at suppertime and reduce the NovoLog at breakfast  Patient Instructions  HUMULIN N insulin in ams 20 units   Novolog 3 at breakfast  Novolog 4 units at supper, if no Carbs take no insulin; if small meal take 3 units     Raymond [name] 04/20/2014, 1:57 PM   Note: This office note was prepared with Dragon voice recognition system technology. Any transcriptional errors that result from this process are unintentional.

## 2014-04-20 NOTE — Patient Instructions (Addendum)
HUMULIN N insulin in ams 20 units   Novolog 3 at breakfast  Novolog 4 units at supper, if no Carbs take no insulin; if small meal take 3 units

## 2014-04-21 ENCOUNTER — Inpatient Hospital Stay (HOSPITAL_COMMUNITY)
Admission: EM | Admit: 2014-04-21 | Discharge: 2014-04-30 | DRG: 247 | Disposition: A | Discharge status: Home / Self Care | Payer: Managed Care, Other (non HMO) | Attending: Interventional Cardiology | Admitting: Interventional Cardiology

## 2014-04-21 ENCOUNTER — Emergency Department (HOSPITAL_COMMUNITY): Payer: Managed Care, Other (non HMO)

## 2014-04-21 ENCOUNTER — Encounter (HOSPITAL_COMMUNITY): Payer: Self-pay | Admitting: Emergency Medicine

## 2014-04-21 DIAGNOSIS — I214 Non-ST elevation (NSTEMI) myocardial infarction: Principal | ICD-10-CM

## 2014-04-21 DIAGNOSIS — E785 Hyperlipidemia, unspecified: Secondary | ICD-10-CM

## 2014-04-21 DIAGNOSIS — Z7982 Long term (current) use of aspirin: Secondary | ICD-10-CM

## 2014-04-21 DIAGNOSIS — E119 Type 2 diabetes mellitus without complications: Secondary | ICD-10-CM

## 2014-04-21 DIAGNOSIS — D649 Anemia, unspecified: Secondary | ICD-10-CM | POA: Diagnosis not present

## 2014-04-21 DIAGNOSIS — Z85038 Personal history of other malignant neoplasm of large intestine: Secondary | ICD-10-CM

## 2014-04-21 DIAGNOSIS — I2510 Atherosclerotic heart disease of native coronary artery without angina pectoris: Secondary | ICD-10-CM

## 2014-04-21 DIAGNOSIS — I200 Unstable angina: Secondary | ICD-10-CM

## 2014-04-21 DIAGNOSIS — N184 Chronic kidney disease, stage 4 (severe): Secondary | ICD-10-CM

## 2014-04-21 DIAGNOSIS — Z9849 Cataract extraction status, unspecified eye: Secondary | ICD-10-CM

## 2014-04-21 DIAGNOSIS — I5032 Chronic diastolic (congestive) heart failure: Secondary | ICD-10-CM | POA: Diagnosis present

## 2014-04-21 DIAGNOSIS — T8389XA Other specified complication of genitourinary prosthetic devices, implants and grafts, initial encounter: Secondary | ICD-10-CM | POA: Diagnosis not present

## 2014-04-21 DIAGNOSIS — I25810 Atherosclerosis of coronary artery bypass graft(s) without angina pectoris: Secondary | ICD-10-CM | POA: Diagnosis present

## 2014-04-21 DIAGNOSIS — R82998 Other abnormal findings in urine: Secondary | ICD-10-CM | POA: Diagnosis not present

## 2014-04-21 DIAGNOSIS — I472 Ventricular tachycardia, unspecified: Secondary | ICD-10-CM | POA: Diagnosis not present

## 2014-04-21 DIAGNOSIS — M129 Arthropathy, unspecified: Secondary | ICD-10-CM | POA: Diagnosis present

## 2014-04-21 DIAGNOSIS — I498 Other specified cardiac arrhythmias: Secondary | ICD-10-CM | POA: Diagnosis not present

## 2014-04-21 DIAGNOSIS — R079 Chest pain, unspecified: Secondary | ICD-10-CM

## 2014-04-21 DIAGNOSIS — Y846 Urinary catheterization as the cause of abnormal reaction of the patient, or of later complication, without mention of misadventure at the time of the procedure: Secondary | ICD-10-CM | POA: Diagnosis not present

## 2014-04-21 DIAGNOSIS — I2584 Coronary atherosclerosis due to calcified coronary lesion: Secondary | ICD-10-CM

## 2014-04-21 DIAGNOSIS — I959 Hypotension, unspecified: Secondary | ICD-10-CM | POA: Diagnosis not present

## 2014-04-21 DIAGNOSIS — E7800 Pure hypercholesterolemia, unspecified: Secondary | ICD-10-CM | POA: Diagnosis present

## 2014-04-21 DIAGNOSIS — I700 Atherosclerosis of aorta: Secondary | ICD-10-CM | POA: Diagnosis present

## 2014-04-21 DIAGNOSIS — I359 Nonrheumatic aortic valve disorder, unspecified: Secondary | ICD-10-CM

## 2014-04-21 DIAGNOSIS — I120 Hypertensive chronic kidney disease with stage 5 chronic kidney disease or end stage renal disease: Secondary | ICD-10-CM | POA: Diagnosis present

## 2014-04-21 DIAGNOSIS — N185 Chronic kidney disease, stage 5: Secondary | ICD-10-CM | POA: Diagnosis present

## 2014-04-21 DIAGNOSIS — J439 Emphysema, unspecified: Secondary | ICD-10-CM

## 2014-04-21 DIAGNOSIS — I2582 Chronic total occlusion of coronary artery: Secondary | ICD-10-CM | POA: Diagnosis present

## 2014-04-21 DIAGNOSIS — I249 Acute ischemic heart disease, unspecified: Secondary | ICD-10-CM

## 2014-04-21 DIAGNOSIS — N2581 Secondary hyperparathyroidism of renal origin: Secondary | ICD-10-CM | POA: Diagnosis present

## 2014-04-21 DIAGNOSIS — I4729 Other ventricular tachycardia: Secondary | ICD-10-CM | POA: Diagnosis not present

## 2014-04-21 DIAGNOSIS — I350 Nonrheumatic aortic (valve) stenosis: Secondary | ICD-10-CM

## 2014-04-21 DIAGNOSIS — I509 Heart failure, unspecified: Secondary | ICD-10-CM | POA: Diagnosis present

## 2014-04-21 DIAGNOSIS — Z87891 Personal history of nicotine dependence: Secondary | ICD-10-CM

## 2014-04-21 DIAGNOSIS — J438 Other emphysema: Secondary | ICD-10-CM | POA: Diagnosis present

## 2014-04-21 DIAGNOSIS — Z961 Presence of intraocular lens: Secondary | ICD-10-CM

## 2014-04-21 DIAGNOSIS — I10 Essential (primary) hypertension: Secondary | ICD-10-CM

## 2014-04-21 DIAGNOSIS — Z8249 Family history of ischemic heart disease and other diseases of the circulatory system: Secondary | ICD-10-CM

## 2014-04-21 DIAGNOSIS — I440 Atrioventricular block, first degree: Secondary | ICD-10-CM | POA: Diagnosis present

## 2014-04-21 DIAGNOSIS — I25710 Atherosclerosis of autologous vein coronary artery bypass graft(s) with unstable angina pectoris: Secondary | ICD-10-CM

## 2014-04-21 DIAGNOSIS — E1165 Type 2 diabetes mellitus with hyperglycemia: Secondary | ICD-10-CM

## 2014-04-21 DIAGNOSIS — T83091A Other mechanical complication of indwelling urethral catheter, initial encounter: Secondary | ICD-10-CM

## 2014-04-21 DIAGNOSIS — IMO0001 Reserved for inherently not codable concepts without codable children: Secondary | ICD-10-CM | POA: Diagnosis present

## 2014-04-21 DIAGNOSIS — Z794 Long term (current) use of insulin: Secondary | ICD-10-CM

## 2014-04-21 LAB — BASIC METABOLIC PANEL
BUN: 71 mg/dL — AB (ref 6–23)
BUN: 70 mg/dL — AB (ref 6–23)
CALCIUM: 9.8 mg/dL (ref 8.4–10.5)
CALCIUM: 9.5 mg/dL (ref 8.4–10.5)
CO2: 22 mEq/L (ref 19–32)
CO2: 24 mEq/L (ref 19–32)
CREATININE: 5.06 mg/dL — AB (ref 0.50–1.35)
Chloride: 96 mEq/L (ref 96–112)
Chloride: 99 mEq/L (ref 96–112)
Creatinine, Ser: 5.12 mg/dL — ABNORMAL HIGH (ref 0.50–1.35)
GFR calc Af Amer: 11 mL/min — ABNORMAL LOW (ref >90)
GFR, EST AFRICAN AMERICAN: 11 mL/min — AB (ref >90)
GFR, EST NON AFRICAN AMERICAN: 10 mL/min — AB (ref >90)
GFR, EST NON AFRICAN AMERICAN: 9 mL/min — AB (ref >90)
Glucose, Bld: 230 mg/dL — ABNORMAL HIGH (ref 70–99)
Glucose, Bld: 204 mg/dL — ABNORMAL HIGH (ref 70–99)
Potassium: 3.9 mEq/L (ref 3.7–5.3)
Potassium: 3.4 mEq/L — ABNORMAL LOW (ref 3.7–5.3)
SODIUM: 140 meq/L (ref 137–147)
Sodium: 139 mEq/L (ref 137–147)

## 2014-04-21 LAB — CBC
HCT: 34.9 % — ABNORMAL LOW (ref 39.0–52.0)
HEMATOCRIT: 32.2 % — AB (ref 39.0–52.0)
HEMOGLOBIN: 10.3 g/dL — AB (ref 13.0–17.0)
Hemoglobin: 11.1 g/dL — ABNORMAL LOW (ref 13.0–17.0)
MCH: 29.8 pg (ref 26.0–34.0)
MCH: 29.8 pg (ref 26.0–34.0)
MCHC: 31.8 g/dL (ref 30.0–36.0)
MCHC: 32 g/dL (ref 30.0–36.0)
MCV: 93.6 fL (ref 78.0–100.0)
MCV: 93.1 fL (ref 78.0–100.0)
Platelets: 377 K/uL (ref 150–400)
Platelets: 350 10*3/uL (ref 150–400)
RBC: 3.73 MIL/uL — ABNORMAL LOW (ref 4.22–5.81)
RBC: 3.46 MIL/uL — ABNORMAL LOW (ref 4.22–5.81)
RDW: 15.9 % — ABNORMAL HIGH (ref 11.5–15.5)
RDW: 15.9 % — AB (ref 11.5–15.5)
WBC: 18.3 K/uL — ABNORMAL HIGH (ref 4.0–10.5)
WBC: 12.3 10*3/uL — ABNORMAL HIGH (ref 4.0–10.5)

## 2014-04-21 LAB — URINALYSIS, ROUTINE W REFLEX MICROSCOPIC
Bilirubin Urine: NEGATIVE
Glucose, UA: 250 mg/dL — AB
Ketones, ur: NEGATIVE mg/dL
NITRITE: NEGATIVE
PH: 5 (ref 5.0–8.0)
Protein, ur: 30 mg/dL — AB
SPECIFIC GRAVITY, URINE: 1.016 (ref 1.005–1.030)
Urobilinogen, UA: 0.2 mg/dL (ref 0.0–1.0)

## 2014-04-21 LAB — LIPID PANEL
CHOLESTEROL: 110 mg/dL (ref 0–200)
HDL: 40 mg/dL (ref >39)
LDL Cholesterol: 57 mg/dL (ref 0–99)
TRIGLYCERIDES: 63 mg/dL (ref <150)
Total CHOL/HDL Ratio: 2.8 RATIO
VLDL: 13 mg/dL (ref 0–40)

## 2014-04-21 LAB — GLUCOSE, CAPILLARY
GLUCOSE-CAPILLARY: 218 mg/dL — AB (ref 70–99)
Glucose-Capillary: 195 mg/dL — ABNORMAL HIGH (ref 70–99)
Glucose-Capillary: 78 mg/dL (ref 70–99)

## 2014-04-21 LAB — HEPARIN LEVEL (UNFRACTIONATED): Heparin Unfractionated: 0.37 IU/mL (ref 0.30–0.70)

## 2014-04-21 LAB — I-STAT TROPONIN, ED: Troponin i, poc: 0.02 ng/mL (ref 0.00–0.08)

## 2014-04-21 LAB — URINE MICROSCOPIC-ADD ON

## 2014-04-21 LAB — PRO B NATRIURETIC PEPTIDE: Pro B Natriuretic peptide (BNP): 5741 pg/mL — ABNORMAL HIGH (ref 0–450)

## 2014-04-21 LAB — TROPONIN I
Troponin I: 3.9 ng/mL (ref <0.30)
Troponin I: 3.96 ng/mL (ref <0.30)
Troponin I: 3.06 ng/mL (ref <0.30)

## 2014-04-21 LAB — PROTIME-INR
INR: 1.15 (ref 0.00–1.49)
Prothrombin Time: 14.5 seconds (ref 11.6–15.2)

## 2014-04-21 MED ORDER — ADULT MULTIVITAMIN W/MINERALS CH
1.0000 | ORAL_TABLET | Freq: Every day | ORAL | Status: DC
  Administered 2014-04-21 – 2014-04-30 (×10): 1 via ORAL
  Filled 2014-04-21 (×10): qty 1

## 2014-04-21 MED ORDER — TRAMADOL HCL 50 MG PO TABS
50.0000 mg | ORAL_TABLET | Freq: Four times a day (QID) | ORAL | Status: DC | PRN
  Administered 2014-04-23 – 2014-04-25 (×3): 50 mg via ORAL
  Filled 2014-04-21 (×4): qty 1

## 2014-04-21 MED ORDER — ISOSORBIDE MONONITRATE ER 60 MG PO TB24
60.0000 mg | ORAL_TABLET | Freq: Every day | ORAL | Status: DC
  Administered 2014-04-21 – 2014-04-30 (×10): 60 mg via ORAL
  Filled 2014-04-21 (×10): qty 1

## 2014-04-21 MED ORDER — INSULIN ASPART 100 UNIT/ML ~~LOC~~ SOLN
4.0000 [IU] | Freq: Every day | SUBCUTANEOUS | Status: DC
Start: 2014-04-21 — End: 2014-04-30
  Administered 2014-04-21: 3 [IU] via SUBCUTANEOUS
  Administered 2014-04-24 – 2014-04-26 (×2): 4 [IU] via SUBCUTANEOUS

## 2014-04-21 MED ORDER — DOXAZOSIN MESYLATE 4 MG PO TABS
4.0000 mg | ORAL_TABLET | Freq: Every day | ORAL | Status: DC
  Administered 2014-04-21 – 2014-04-30 (×10): 4 mg via ORAL
  Filled 2014-04-21 (×11): qty 1

## 2014-04-21 MED ORDER — HEPARIN (PORCINE) IN NACL 100-0.45 UNIT/ML-% IJ SOLN
950.0000 [IU]/h | INTRAMUSCULAR | Status: DC
  Administered 2014-04-21 – 2014-04-22 (×2): 800 [IU]/h via INTRAVENOUS
  Administered 2014-04-23 – 2014-04-24 (×2): 950 [IU]/h via INTRAVENOUS
  Filled 2014-04-21 (×7): qty 250

## 2014-04-21 MED ORDER — ASPIRIN EC 81 MG PO TBEC
81.0000 mg | DELAYED_RELEASE_TABLET | Freq: Every day | ORAL | Status: DC
  Administered 2014-04-21 – 2014-04-26 (×6): 81 mg via ORAL
  Filled 2014-04-21 (×6): qty 1

## 2014-04-21 MED ORDER — PANCRELIPASE (LIP-PROT-AMYL) 12000-38000 UNITS PO CPEP
2.0000 | ORAL_CAPSULE | Freq: Three times a day (TID) | ORAL | Status: DC
  Administered 2014-04-21 – 2014-04-30 (×23): 2 via ORAL
  Filled 2014-04-21 (×31): qty 2

## 2014-04-21 MED ORDER — PARICALCITOL 1 MCG PO CAPS
1.0000 ug | ORAL_CAPSULE | ORAL | Status: DC
  Administered 2014-04-21 – 2014-04-29 (×4): 1 ug via ORAL
  Filled 2014-04-21 (×5): qty 1

## 2014-04-21 MED ORDER — INSULIN NPH (HUMAN) (ISOPHANE) 100 UNIT/ML ~~LOC~~ SUSP
22.0000 [IU] | Freq: Every day | SUBCUTANEOUS | Status: DC
  Filled 2014-04-21: qty 10

## 2014-04-21 MED ORDER — ATORVASTATIN CALCIUM 80 MG PO TABS
80.0000 mg | ORAL_TABLET | Freq: Every day | ORAL | Status: DC
  Administered 2014-04-21 – 2014-04-29 (×9): 80 mg via ORAL
  Filled 2014-04-21 (×10): qty 1

## 2014-04-21 MED ORDER — INSULIN ASPART 100 UNIT/ML ~~LOC~~ SOLN
2.0000 [IU] | Freq: Every day | SUBCUTANEOUS | Status: DC
  Administered 2014-04-21 – 2014-04-25 (×5): 2 [IU] via SUBCUTANEOUS

## 2014-04-21 MED ORDER — NITROGLYCERIN 0.4 MG SL SUBL
0.4000 mg | SUBLINGUAL_TABLET | SUBLINGUAL | Status: DC | PRN
  Filled 2014-04-21: qty 1

## 2014-04-21 MED ORDER — METOPROLOL TARTRATE 50 MG PO TABS
75.0000 mg | ORAL_TABLET | Freq: Two times a day (BID) | ORAL | Status: DC
  Administered 2014-04-21 – 2014-04-30 (×19): 75 mg via ORAL
  Filled 2014-04-21 (×20): qty 1

## 2014-04-21 MED ORDER — ASPIRIN 81 MG PO CHEW
324.0000 mg | CHEWABLE_TABLET | Freq: Once | ORAL | Status: AC
  Administered 2014-04-21: 324 mg via ORAL
  Filled 2014-04-21: qty 4

## 2014-04-21 MED ORDER — NITROGLYCERIN 0.4 MG SL SUBL
0.4000 mg | SUBLINGUAL_TABLET | SUBLINGUAL | Status: DC | PRN
  Administered 2014-04-23: 0.4 mg via SUBLINGUAL
  Filled 2014-04-21: qty 1

## 2014-04-21 MED ORDER — MOMETASONE FURO-FORMOTEROL FUM 100-5 MCG/ACT IN AERO
2.0000 | INHALATION_SPRAY | Freq: Two times a day (BID) | RESPIRATORY_TRACT | Status: DC
  Administered 2014-04-21 – 2014-04-30 (×15): 2 via RESPIRATORY_TRACT
  Filled 2014-04-21: qty 8.8

## 2014-04-21 MED ORDER — INSULIN ASPART 100 UNIT/ML ~~LOC~~ SOLN
4.0000 [IU] | Freq: Every day | SUBCUTANEOUS | Status: DC
Start: 2014-04-22 — End: 2014-04-21

## 2014-04-21 MED ORDER — TIOTROPIUM BROMIDE MONOHYDRATE 18 MCG IN CAPS
18.0000 ug | ORAL_CAPSULE | Freq: Every day | RESPIRATORY_TRACT | Status: DC
  Administered 2014-04-21 – 2014-04-30 (×8): 18 ug via RESPIRATORY_TRACT
  Filled 2014-04-21 (×2): qty 5

## 2014-04-21 MED ORDER — HEPARIN BOLUS VIA INFUSION
4000.0000 [IU] | Freq: Once | INTRAVENOUS | Status: AC
  Administered 2014-04-21: 4000 [IU] via INTRAVENOUS
  Filled 2014-04-21: qty 4000

## 2014-04-21 MED ORDER — PANTOPRAZOLE SODIUM 40 MG PO TBEC
80.0000 mg | DELAYED_RELEASE_TABLET | Freq: Every day | ORAL | Status: DC
  Administered 2014-04-21 – 2014-04-30 (×10): 80 mg via ORAL
  Filled 2014-04-21 (×10): qty 2

## 2014-04-21 MED ORDER — ALBUTEROL SULFATE (2.5 MG/3ML) 0.083% IN NEBU
2.5000 mg | INHALATION_SOLUTION | Freq: Four times a day (QID) | RESPIRATORY_TRACT | Status: DC | PRN
  Filled 2014-04-21: qty 3

## 2014-04-21 MED ORDER — FUROSEMIDE 80 MG PO TABS
80.0000 mg | ORAL_TABLET | Freq: Two times a day (BID) | ORAL | Status: DC
  Administered 2014-04-21 – 2014-04-30 (×16): 80 mg via ORAL
  Filled 2014-04-21 (×21): qty 1

## 2014-04-21 MED ORDER — DOCUSATE SODIUM 100 MG PO CAPS
100.0000 mg | ORAL_CAPSULE | Freq: Every day | ORAL | Status: DC
  Administered 2014-04-21 – 2014-04-30 (×9): 100 mg via ORAL
  Filled 2014-04-21 (×10): qty 1

## 2014-04-21 MED ORDER — INSULIN NPH (HUMAN) (ISOPHANE) 100 UNIT/ML ~~LOC~~ SUSP
22.0000 [IU] | Freq: Every day | SUBCUTANEOUS | Status: DC
  Administered 2014-04-21: 20 [IU] via SUBCUTANEOUS
  Administered 2014-04-22 – 2014-04-29 (×6): 22 [IU] via SUBCUTANEOUS
  Filled 2014-04-21 (×3): qty 10

## 2014-04-21 NOTE — Progress Notes (Signed)
Primary cardiologist: Dr. Pernell Dupre  Subjective:   No active chest pain or shortness of breath, no palpitations.   Objective:   Temp:  [97.4 F (36.3 C)-98.2 F (36.8 C)] 97.4 F (36.3 C) (06/13 0647) Pulse Rate:  [67-102] 86 (06/13 0647) Resp:  [16-23] 16 (06/13 0647) BP: (126-189)/(73-107) 173/91 mmHg (06/13 0647) SpO2:  [93 %-100 %] 98 % (06/13 0647) Weight:  [144 lb 13.5 oz (65.7 kg)-150 lb (68.04 kg)] 144 lb 13.5 oz (65.7 kg) (06/13 0647) Last BM Date: 04/19/14  Filed Weights   04/21/14 0048 04/21/14 0647  Weight: 150 lb (68.04 kg) 144 lb 13.5 oz (65.7 kg)   No intake or output data in the 24 hours ending 04/21/14 1254  Telemetry: Sinus rhythm with PACs.  Exam:  General: Appears comfortable.  Lungs: Decreased breath sounds, nonlabored breathing.  Cardiac: Regular with ectopy, 2/6 systolic murmur at the base.  Extremities: No pitting.   Lab Results:  Basic Metabolic Panel:  Recent Labs Lab 04/21/14 0100 04/21/14 0845  NA 139 140  K 3.9 3.4*  CL 96 99  CO2 22 24  GLUCOSE 230* 204*  BUN 71* 70*  CREATININE 5.06* 5.12*  CALCIUM 9.8 9.5    CBC:  Recent Labs Lab 04/21/14 0100 04/21/14 0845  WBC 18.3* 12.3*  HGB 11.1* 10.3*  HCT 34.9* 32.2*  MCV 93.6 93.1  PLT 377 350    Cardiac Enzymes:  Recent Labs Lab 04/21/14 0845  TROPONINI 3.90*    BNP:  Recent Labs  04/21/14 0845  PROBNP 5741.0*    Coagulation:  Recent Labs Lab 04/21/14 0845  INR 1.15    ECG: Sinus rhythm with prolonged PR interval, poor R wave progressive.   Medications:   Scheduled Medications: . aspirin EC  81 mg Oral Daily  . atorvastatin  80 mg Oral q1800  . docusate sodium  100 mg Oral Daily  . doxazosin  4 mg Oral Daily  . furosemide  80 mg Oral BID  . insulin aspart  2 Units Subcutaneous QAC supper  . insulin aspart  4 Units Subcutaneous QAC breakfast  . insulin NPH Human  22 Units Subcutaneous QAC breakfast  . isosorbide mononitrate  60 mg  Oral Daily  . lipase/protease/amylase  2 capsule Oral TID WC  . metoprolol  75 mg Oral BID  . mometasone-formoterol  2 puff Inhalation BID  . multivitamin with minerals  1 tablet Oral Daily  . pantoprazole  80 mg Oral Daily  . paricalcitol  1 mcg Oral QODAY  . tiotropium  18 mcg Inhalation Daily     Infusions: . heparin 800 Units/hr (04/21/14 0301)     PRN Medications:  albuterol, nitroGLYCERIN, traMADol   Assessment:   1. NSTEMI. Initial troponin I 3.9. Currently chest pain-free on heparin.  2. CAD status post prior CABG. LVEF 65-70% in 2013.  3. History of mild aortic stenosis, followup echocardiogram pending.  4. CKD, stage 5. Creatinine 5.0-5.1. Patient follows with Dr. Moshe Cipro. He has not required hemodialysis so far.  5. Hyperlipidemia, on statin therapy.  6. Type 2 diabetes mellitus.  7. Hypertension.  8. COPD.   Plan/Discussion:    I reviewed the cardiology fellow admission note and also Dr. Thompson Caul recent office note from May 19. At that time there was discussion about what to do if his cardiac status worsened, and whether he would consider cardiac catheterization realizing that he would likely move toward dialysis. It sounds like no option was ruled out at  that point. We will followup on echocardiogram to reassess degree of aortic stenosis, also ask for formal Nephrology consultation since cardiac catheterization will need to be discussed during this hospital stay. At this point there is no urgency to proceed however. Would involve Dr. Tamala Julian in this decision. For now continue medical therapy including heparin.   Satira Sark, M.D., F.A.C.C.

## 2014-04-21 NOTE — ED Notes (Signed)
Long term 14 Fr catheter. For bladder issues.

## 2014-04-21 NOTE — ED Provider Notes (Signed)
CSN: [id number]     Arrival date & time 04/21/14  0039 History   None    Chief Complaint  Patient presents with  . Chest Pain     (Consider location/radiation/quality/duration/timing/severity/associated sxs/prior Treatment) HPI This patient is a pleasant 80 year old man with a history of coronary artery disease, status post CABG-remotely.  He also has a history of COPD, hypertension, hyperlipidemia, chronic kidney disease, type 2 diabetes, pancreatitis rate he wears home oxygen at night.  He comes in today after experiencing left-sided chest pain which began at rest at about 9:30 PM. Over the course fracture 30 minutes, the patient took 6 sublingual nitroglycerin. He did not feel that this did much for his pain. He ultimately called EMS and was delivered to the emergency department. He received 324 mg aspirin. He is currently pain free without any further intervention. He says he has had similar symptoms to this in the past. He does not recall when his recent functional study was. However, as his cardiologist, Dr. Tamala Julian, told him that he had a blockage which was not amenable to repair.  Past Medical History  Diagnosis Date  . COPD (chronic obstructive pulmonary disease)   . Hypertension   . CAD (coronary artery disease), autologous vein bypass graft   . High cholesterol   . Acute chest pain 08/18/2012    "cardiologist said it was not my heart" (08/18/2012)  . Pneumonia 1938  . Shortness of breath     "related to the COPD" (08/18/2012)  . Type II diabetes mellitus   . History of blood transfusion 1938  . Colon cancer   . Chronic kidney disease, stage 4 (severe)   . Arthritis     "back, hands, neck" (08/18/2012)  . History of pancreatitis ~ 1985  . Anginal pain    Past Surgical History  Procedure Laterality Date  . Appendectomy  1938  . Inguinal hernia repair  2000's    bilaterally  . Cataract extraction w/ intraocular lens  implant, bilateral  2000's  . Coronary artery  bypass graft  2000's    CABG X4  . Colectomy  2000's    "cancer; prior to having double hernia operation" (08/18/2012)   Family History  Problem Relation Age of Onset  . Heart disease Mother   . Heart disease Father    History  Substance Use Topics  . Smoking status: Former Smoker -- 4.00 packs/day for 30 years    Types: Cigarettes    Quit date: 07/13/1983  . Smokeless tobacco: Never Used  . Alcohol Use: Yes     Comment: 08/18/2012 "drank too much; stopped ~ 1985"    Review of Systems Ten point review of symptoms performed and is negative with the exception of symptoms noted above.     Allergies  Codeine and Dilaudid  Home Medications   Prior to Admission medications   Medication Sig Start Date End Date Taking? Authorizing Provider  albuterol (PROVENTIL HFA;VENTOLIN HFA) 108 (90 BASE) MCG/ACT inhaler Inhale 2 puffs into the lungs every 6 (six) hours as needed. For wheeze or shortness of breath      Historical Provider, MD  aspirin EC 81 MG tablet Take 81 mg by mouth daily.      Historical Provider, MD  BD INSULIN SYRINGE ULTRAFINE 31G X 5/16" 0.3 ML MISC  04/03/14   Historical Provider, MD  docusate sodium (COLACE) 100 MG capsule Take 100 mg by mouth daily.    Historical Provider, MD  doxazosin (CARDURA) 4 MG tablet  Take 4 mg by mouth daily.    Historical Provider, MD  epoetin alfa (EPOGEN,PROCRIT) 25366 UNIT/ML injection 10,000 Units every 14 (fourteen) days.    Historical Provider, MD  Fluticasone-Salmeterol (ADVAIR) 100-50 MCG/DOSE AEPB Inhale 1 puff into the lungs every 12 (twelve) hours.      Historical Provider, MD  furosemide (LASIX) 40 MG tablet 80 mg 2 (two) times daily.     Historical Provider, MD  insulin NPH (HUMULIN N,NOVOLIN N) 100 UNIT/ML injection Sliding scale with each meal    Historical Provider, MD  insulin regular (NOVOLIN R,HUMULIN R) 100 units/mL injection Sliding scale with each meal    Historical Provider, MD  isosorbide mononitrate (IMDUR) 60 MG 24  hr tablet Take 1 tablet (60 mg total) by mouth daily. 03/07/14 05/04/16  Belva Crome III, MD  lipase/protease/amylase (CREON-10/PANCREASE) 12000 UNITS CPEP 2 before meals (6 daily)    Historical Provider, MD  metoprolol (LOPRESSOR) 50 MG tablet Take 50 mg by mouth 2 (two) times daily.      Historical Provider, MD  Multiple Vitamins-Minerals (MULTIVITAMINS THER. W/MINERALS) TABS Take 1 tablet by mouth daily.      Historical Provider, MD  nitroGLYCERIN (NITROSTAT) 0.4 MG SL tablet Place 1 tablet (0.4 mg total) under the tongue every 5 (five) minutes as needed for chest pain. 03/15/14   Belva Crome III, MD  omeprazole (PRILOSEC) 20 MG capsule Take 20 mg by mouth daily.      Historical Provider, MD  Roma Schanz test strip Test 3 times daily 02/15/14   Historical Provider, MD  OVER THE COUNTER MEDICATION Take 1 tablet by mouth daily. Legatrin PM    Historical Provider, MD  paricalcitol (ZEMPLAR) 1 MCG capsule Take 1 mcg by mouth every other day.     Historical Provider, MD  quiNINE (QUALAQUIN) 324 MG capsule 2 caps by mouth every evening    Historical Provider, MD  simvastatin (ZOCOR) 10 MG tablet Take 10 mg by mouth at bedtime.      Historical Provider, MD  tiotropium (SPIRIVA) 18 MCG inhalation capsule Place 18 mcg into inhaler and inhale daily.      Historical Provider, MD  traMADol (ULTRAM) 50 MG tablet Take 50 mg by mouth every 6 (six) hours as needed. Maximum dose= 8 tablets per day     Historical Provider, MD   BP 189/90  Pulse 102  Temp(Src) 98.1 F (36.7 C) (Oral)  Ht 5\' 6"  (1.676 m)  Wt 150 lb (68.04 kg)  BMI 24.22 kg/m2  SpO2 96% Physical Exam Gen: well developed and well nourished appearing Head: NCAT Eyes: PERL, EOMI Nose: no epistaixis or rhinorrhea Mouth/throat: mucosa is moist and pink Neck: supple, no stridor Chest: no chest wall ttp.  Lungs: CTA B, no wheezing, rhonchi or rales CV: RRR, no murmur, extremities appear well perfused.  Abd: soft, notender,  nondistended Back: no ttp, no cva ttp Skin: warm and dry Ext: normal to inspection, no dependent edema Neuro: CN ii-xii grossly intact, no focal deficits Psyche; normal affect,  calm and cooperative.   ED Course  Procedures (including critical care time) Labs Review  Labs notable for CKD, chronic anemia, mild leukocytosis and negative troponin    DG CHEST PORT 1 VIEW (Final result)  Result time: 04/24/14 16:57:14    Final result by Rad Results In Interface (04/24/14 16:57:14)    Narrative:   CLINICAL DATA: Status post placement dialysis catheter.  EXAM: PORTABLE CHEST - 1 VIEW  COMPARISON: PA and lateral  chest 04/21/2014.  FINDINGS: Right IJ approach central venous catheter is in place with the tips in the mid superior vena cava. There is no pneumothorax. The lungs appear emphysematous. The patient is status post CABG. Heart size is upper normal.  IMPRESSION: Right IJ catheter tip projects over the mid superior vena cava. Negative for pneumothorax.  No acute cardiopulmonary disease.  Emphysema.      EKG: NSR, ST depression in inferolateral leads which appears new compared to most recent EKG, normal intervals, normal axis, normal qrs complex  MDM   DDX: ACS, pneumothorax, pneumonia, pericardial or pleural effusion, gastritis, GERD/PUD, musculoskeletal pain.   This presentation is consistent with ACS. Following CXR, we will tx with Lovenox. We are awaiting cardiac markers, CBC, CMP, lipase. Patient remains pain free. Plan to admit.   Cardiology paged to request consultation.   Case disussed with Cardiologist on call who will see and evaluate the patient in the ED.   Elyn Peers, MD 04/29/14 [phone]

## 2014-04-21 NOTE — H&P (Addendum)
Patient ID: Raymond [name] MRN: [id number], DOB/AGE: 78/19/1931   Admit date: 04/21/2014   Primary Physician: Milagros Evener, MD Primary Cardiologist: Daneen Schick III, MD  Pt. Profile:  42M DM, HTN, HLD, CAD s/p CABG, angina,  HFpEF, CKD V, mild AS who presents with chest pain and ECG changes.   Problem List  Past Medical History  Diagnosis Date  . COPD (chronic obstructive pulmonary disease)   . Hypertension   . CAD (coronary artery disease), autologous vein bypass graft   . High cholesterol   . Acute chest pain 08/18/2012    "cardiologist said it was not my heart" (08/18/2012)  . Pneumonia 1938  . Shortness of breath     "related to the COPD" (08/18/2012)  . Type II diabetes mellitus   . History of blood transfusion 1938  . Colon cancer   . Chronic kidney disease, stage 4 (severe)   . Arthritis     "back, hands, neck" (08/18/2012)  . History of pancreatitis ~ 1985  . Anginal pain     Past Surgical History  Procedure Laterality Date  . Appendectomy  1938  . Inguinal hernia repair  2000's    bilaterally  . Cataract extraction w/ intraocular lens  implant, bilateral  2000's  . Coronary artery bypass graft  2000's    CABG X4  . Colectomy  2000's    "cancer; prior to having double hernia operation" (08/18/2012)     Allergies  Allergies  Allergen Reactions  . Codeine Nausea And Vomiting  . Dilaudid [Hydromorphone Hcl] Nausea And Vomiting    "I get deathly sick"; diaphoresis    HPI  42M DM, HTN, HLD, CAD s/p CABG, angina,  HFpEF, CKD V, mild AS who presents with chest pain and ECG changes.   The patient states that at 9:30pm while sitting in a chair he developed 8-9/10 chest pain that "just hurt" and was associated with some double vision. No other associated symptoms. He also notes that earlier in the day he felt "funny" at dinner, although his wife believes he was a bit hypoglycemic.  He took a total of 6 SL NTG over the course of 30 minutes without  any relief. He does note that he carries his SL NTG in a small metal container and is not sure how long ago they had been opened but does know that they were not expired. He is on 2L home oxygen at night for his COPD and this has not changed.   EMS was called and he was given 324mg  ASA. By the time he arrived at the ED, he was chest pain free. ECG was noted to have new ST depressions and TWI in lateral leads. IV UFH was started and cardiology was consulted for admission.    Home Medications  Prior to Admission medications   Medication Sig Start Date End Date Taking? Authorizing Provider  albuterol (PROVENTIL HFA;VENTOLIN HFA) 108 (90 BASE) MCG/ACT inhaler Inhale 2 puffs into the lungs every 6 (six) hours as needed. For wheeze or shortness of breath     Yes Historical Provider, MD  aspirin EC 81 MG tablet Take 81 mg by mouth daily.     Yes Historical Provider, MD  docusate sodium (COLACE) 100 MG capsule Take 100 mg by mouth daily.   Yes Historical Provider, MD  doxazosin (CARDURA) 4 MG tablet Take 4 mg by mouth daily.   Yes Historical Provider, MD  epoetin alfa (EPOGEN,PROCRIT) 69485 UNIT/ML injection 10,000 Units every 14 (fourteen)  days.   Yes Historical Provider, MD  Fluticasone-Salmeterol (ADVAIR) 100-50 MCG/DOSE AEPB Inhale 1 puff into the lungs every 12 (twelve) hours.     Yes Historical Provider, MD  furosemide (LASIX) 80 MG tablet Take 80 mg by mouth 2 (two) times daily.   Yes Historical Provider, MD  insulin NPH (HUMULIN N,NOVOLIN N) 100 UNIT/ML injection Sliding scale with each meal   Yes Historical Provider, MD  insulin regular (NOVOLIN R,HUMULIN R) 100 units/mL injection Sliding scale with each meal   Yes Historical Provider, MD  isosorbide mononitrate (IMDUR) 60 MG 24 hr tablet Take 1 tablet (60 mg total) by mouth daily. 03/07/14 05/04/16 Yes Belva Crome III, MD  lipase/protease/amylase (CREON-10/PANCREASE) 12000 UNITS CPEP 2 capsules 3 (three) times daily with meals. 2 before meals (6  daily)   Yes Historical Provider, MD  metoprolol (LOPRESSOR) 50 MG tablet Take 50 mg by mouth 2 (two) times daily.     Yes Historical Provider, MD  Multiple Vitamins-Minerals (MULTIVITAMINS THER. W/MINERALS) TABS Take 1 tablet by mouth daily.     Yes Historical Provider, MD  nitroGLYCERIN (NITROSTAT) 0.4 MG SL tablet Place 1 tablet (0.4 mg total) under the tongue every 5 (five) minutes as needed for chest pain. 03/15/14  Yes Belva Crome III, MD  omeprazole (PRILOSEC) 20 MG capsule Take 20 mg by mouth daily.     Yes Historical Provider, MD  paricalcitol (ZEMPLAR) 1 MCG capsule Take 1 mcg by mouth every other day.    Yes Historical Provider, MD  quiNINE (QUALAQUIN) 324 MG capsule 2 caps by mouth every evening   Yes Historical Provider, MD  simvastatin (ZOCOR) 10 MG tablet Take 10 mg by mouth at bedtime.     Yes Historical Provider, MD  tiotropium (SPIRIVA) 18 MCG inhalation capsule Place 18 mcg into inhaler and inhale daily.     Yes Historical Provider, MD  traMADol (ULTRAM) 50 MG tablet Take 50 mg by mouth every 6 (six) hours as needed for moderate pain. Maximum dose= 8 tablets per day   Yes Historical Provider, MD    Family History  Family History  Problem Relation Age of Onset  . Heart disease Mother   . Heart disease Father     Social History  History   Social History  . Marital Status: Married    Spouse Name: N/A    Number of Children: N/A  . Years of Education: N/A   Occupational History  . Not on file.   Social History Main Topics  . Smoking status: Former Smoker -- 4.00 packs/day for 30 years    Types: Cigarettes    Quit date: 07/13/1983  . Smokeless tobacco: Never Used  . Alcohol Use: Yes     Comment: 08/18/2012 "drank too much; stopped ~ 1985"  . Drug Use: No  . Sexual Activity: No   Other Topics Concern  . Not on file   Social History Narrative  . No narrative on file     Review of Systems General:  No chills, fever, night sweats or weight changes.    Cardiovascular:   No dyspnea on exertion, edema, orthopnea, palpitations, paroxysmal nocturnal dyspnea. Dermatological: No rash, lesions/masses Respiratory: No cough, dyspnea Urologic: No hematuria. Has chronic indwelling foley Abdominal:   No nausea, vomiting, diarrhea Neurologic:  No visual changes, wkns, changes in mental status. All other systems reviewed and are otherwise negative except as noted above.  Physical Exam  Blood pressure 160/91, pulse 90, temperature 98.1 F (36.7 C),  temperature source Oral, resp. rate 23, height 5\' 6"  (1.676 m), weight 68.04 kg (150 lb), SpO2 100.00%.  General: Pleasant, NAD Psych: Normal affect. Neuro: Alert and oriented X 3. Moves all extremities spontaneously. HEENT: Normal  Neck: Supple without bruits or JVD. Lungs:  Resp regular and unlabored, CTA. Heart: RRR no s3, s4. 3/6 late peaking cres decres systolic murmur with radiation to R>L carotid.  A2 is difficult to auscultate, although all of his heart sounds are soft.  Abdomen: Soft, non-tender, non-distended Extremities: No clubbing, cyanosis. Trace LE edema. DP/PT/Radials 2+ and equal bilaterally.  Labs  Troponin (Point of Care Test)  Recent Labs  04/21/14 0107  TROPIPOC 0.02   No results found for this basename: CKTOTAL, CKMB, TROPONINI,  in the last 72 hours Lab Results  Component Value Date   WBC 18.3* 04/21/2014   HGB 11.1* 04/21/2014   HCT 34.9* 04/21/2014   MCV 93.6 04/21/2014   PLT 377 04/21/2014    Recent Labs Lab 04/21/14 0100  NA 139  K 3.9  CL 96  CO2 22  BUN 71*  CREATININE 5.06*  CALCIUM 9.8  GLUCOSE 230*   No results found for this basename: CHOL, HDL, LDLCALC, TRIG   Lab Results  Component Value Date   DDIMER 1.32* 11/10/2011     Radiology/Studies  Dg Chest 2 View  04/21/2014   CLINICAL DATA:  Sharp left chest pain, cough, COPD  EXAM: CHEST  2 VIEW  COMPARISON:  02/02/2014  FINDINGS: Chronic interstitial markings/emphysematous changes. No focal  consolidation. No pleural effusion or pneumothorax.  The heart is normal in size. Postsurgical changes related to prior CABG.  Degenerative changes of the visualized thoracolumbar spine.  IMPRESSION: No evidence of acute cardiopulmonary disease.   Electronically Signed   By: Julian Hy M.D.   On: 04/21/2014 02:20   TTE 11/11/11 Left ventricle: The cavity size was normal. There was moderate focal basal hypertrophy of the septum. Systolic function was vigorous. The estimated ejection fraction was in the range of 65% to 70%. Wall motion was normal; there were no regional wall motion abnormalities. Doppler parameters are consistent with abnormal left ventricular relaxation (grade 1 diastolic dysfunction). - Mitral valve: Moderately calcified annulus. - Left atrium: The atrium was moderately dilated. - Pulmonary arteries: PA peak pressure: 25mm Hg (S).  ECG NSR, possible LVH, STD and TWI V4-6 with possible TWI in inferior leads but it is difficult to say definitively due to a wavy baseline. Compared to the ECG on 03/27/14, the STD and TWI are new.   ASSESSMENT AND PLAN  60M DM, HTN, HLD, CAD s/p CABG, angina,  HFpEF, CKD V, mild AS who presents with chest pain and ECG changes. He meets criteria for ACS based on ECG changes and typical chest pain. Cath has been deferred in the past despite high suspicion for obstructive disease given his advanced renal failure and likelihood of dye load hastening progression to dialysis dependence. I spoke with the patient and his wife about the possibility of cardiac cath if it seems that a substantial amount of myocardium is at risk. They have been discussing that dialysis is inevitable and that they would like to do PD.   Based on the exam, it is possible that the aortic stenosis has progressed since 2013 and that the AS may be partially contributing to his presentation.   ACS - Continue ASA - increase metoprolol from 50mg  BID to 75mg  BID given hypertension on  presentation - Switch simvastatin to high  dose atorvastatin (80mg )  - Continue home imdur; given possibility of severe AS will hold off on addition of more nitrates - IV UFH - Cycle troponins - TTE - consider P2Y12  Elevated WBC count. May be elevated in setting of ischemia given no clear localizing signs or symptoms.  - send UA, UCx; will defer blood cultures at this time  CKD V Consider renal consult if plan is for cardiac cath.    DM: AC and HS FS, 22u NPH qAM, 4u novolog pre breakfast, 2u novolog pre dinner  We discussed code status. The patient and his wife would like the patient to be full code.   Signed, Lamar Sprinkles, MD 04/21/2014, 5:18 AM

## 2014-04-21 NOTE — Consult Note (Signed)
80 year old male with a history of stage 5 CKD followed by Dr. Moshe Cipro and desires to do peritoneal dialysis when dialysis needed. He does not have an AV access. In Oct 2013 creat was 4.35.  He is admitted on this occasion with chest pressure and pos NSTEMI.  Creat is 5.12.  He has no uremic symptoms. Cath is being considered.  Past Medical History  Diagnosis Date  . COPD (chronic obstructive pulmonary disease)   . Hypertension   . CAD (coronary artery disease), autologous vein bypass graft   . High cholesterol   . Acute chest pain 08/18/2012    "cardiologist said it was not my heart" (08/18/2012)  . Pneumonia 1938  . Shortness of breath     "related to the COPD" (08/18/2012)  . Type II diabetes mellitus   . History of blood transfusion 1938  . Colon cancer   . Chronic kidney disease, stage 4 (severe)   . Arthritis     "back, hands, neck" (08/18/2012)  . History of pancreatitis ~ 1985  . Anginal pain    Past Surgical History  Procedure Laterality Date  . Appendectomy  1938  . Inguinal hernia repair  2000's    bilaterally  . Cataract extraction w/ intraocular lens  implant, bilateral  2000's  . Coronary artery bypass graft  2000's    CABG X4  . Colectomy  2000's    "cancer; prior to having double hernia operation" (08/18/2012)   Social History:  reports that he quit smoking about 30 years ago. His smoking use included Cigarettes. He has a 120 pack-year smoking history. He has never used smokeless tobacco. He reports that he drinks alcohol. He reports that he does not use illicit drugs. Allergies:  Allergies  Allergen Reactions  . Codeine Nausea And Vomiting  . Dilaudid [Hydromorphone Hcl] Nausea And Vomiting    "I get deathly sick"; diaphoresis   Family History  Problem Relation Age of Onset  . Heart disease Mother   . Heart disease Father     Medications:  Scheduled: . aspirin EC  81 mg Oral Daily  . atorvastatin  80 mg Oral q1800  . docusate sodium  100 mg  Oral Daily  . doxazosin  4 mg Oral Daily  . furosemide  80 mg Oral BID  . insulin aspart  2 Units Subcutaneous QAC supper  . insulin aspart  4 Units Subcutaneous QAC breakfast  . insulin NPH Human  22 Units Subcutaneous QAC breakfast  . isosorbide mononitrate  60 mg Oral Daily  . lipase/protease/amylase  2 capsule Oral TID WC  . metoprolol  75 mg Oral BID  . mometasone-formoterol  2 puff Inhalation BID  . multivitamin with minerals  1 tablet Oral Daily  . pantoprazole  80 mg Oral Daily  . paricalcitol  1 mcg Oral QODAY  . tiotropium  18 mcg Inhalation Daily   ROS: as per HPI  Blood pressure 119/95, pulse 67, temperature 97.6 F (36.4 C), temperature source Oral, resp. rate 18, height $RemoveBe'5\' 6"'dBmkYUmUp$  (1.676 m), weight 65.7 kg (144 lb 13.5 oz), SpO2 100.00%.  General appearance: alert and cooperative Head: Normocephalic, without obvious abnormality, atraumatic Throat: lips, mucosa, and tongue normal; teeth and gums normal Resp: clear to auscultation bilaterally Chest wall: no tenderness Cardio: regular rate and rhythm, S1, S2 normal, no murmur, click, rub or gallop GI: soft, non-tender; bowel sounds normal; no masses,  no organomegaly Extremities: extremities normal, atraumatic, no cyanosis or edema Neurologic: Grossly normal Results  for orders placed during the hospital encounter of 04/21/14 (from the past 48 hour(s))  CBC     Status: Abnormal   Collection Time    04/21/14  1:00 AM      Result Value Ref Range   WBC 18.3 (*) 4.0 - 10.5 K/uL   RBC 3.73 (*) 4.22 - 5.81 MIL/uL   Hemoglobin 11.1 (*) 13.0 - 17.0 g/dL   HCT 34.9 (*) 39.0 - 52.0 %   MCV 93.6  78.0 - 100.0 fL   MCH 29.8  26.0 - 34.0 pg   MCHC 31.8  30.0 - 36.0 g/dL   RDW 15.9 (*) 11.5 - 15.5 %   Platelets 377  150 - 400 K/uL  BASIC METABOLIC PANEL     Status: Abnormal   Collection Time    04/21/14  1:00 AM      Result Value Ref Range   Sodium 139  137 - 147 mEq/L   Potassium 3.9  3.7 - 5.3 mEq/L   Chloride 96  96 - 112  mEq/L   CO2 22  19 - 32 mEq/L   Glucose, Bld 230 (*) 70 - 99 mg/dL   BUN 71 (*) 6 - 23 mg/dL   Creatinine, Ser 5.06 (*) 0.50 - 1.35 mg/dL   Calcium 9.8  8.4 - 10.5 mg/dL   GFR calc non Af Amer 10 (*) >90 mL/min   GFR calc Af Amer 11 (*) >90 mL/min   Comment: (NOTE)     The eGFR has been calculated using the CKD EPI equation.     This calculation has not been validated in all clinical situations.     eGFR's persistently <90 mL/min signify possible Chronic Kidney     Disease.  Randolm Idol, ED     Status: None   Collection Time    04/21/14  1:07 AM      Result Value Ref Range   Troponin i, poc 0.02  0.00 - 0.08 ng/mL   Comment 3            Comment: Due to the release kinetics of cTnI,     a negative result within the first hours     of the onset of symptoms does not rule out     myocardial infarction with certainty.     If myocardial infarction is still suspected,     repeat the test at appropriate intervals.  URINALYSIS, ROUTINE W REFLEX MICROSCOPIC     Status: Abnormal   Collection Time    04/21/14  8:15 AM      Result Value Ref Range   Color, Urine YELLOW  YELLOW   APPearance CLOUDY (*) CLEAR   Specific Gravity, Urine 1.016  1.005 - 1.030   pH 5.0  5.0 - 8.0   Glucose, UA 250 (*) NEGATIVE mg/dL   Hgb urine dipstick MODERATE (*) NEGATIVE   Bilirubin Urine NEGATIVE  NEGATIVE   Ketones, ur NEGATIVE  NEGATIVE mg/dL   Protein, ur 30 (*) NEGATIVE mg/dL   Urobilinogen, UA 0.2  0.0 - 1.0 mg/dL   Nitrite NEGATIVE  NEGATIVE   Leukocytes, UA LARGE (*) NEGATIVE  URINE MICROSCOPIC-ADD ON     Status: Abnormal   Collection Time    04/21/14  8:15 AM      Result Value Ref Range   Squamous Epithelial / LPF FEW (*) RARE   WBC, UA 11-20  <3 WBC/hpf   RBC / HPF 3-6  <3 RBC/hpf   Bacteria, UA MANY (*)  RARE  LIPID PANEL     Status: None   Collection Time    04/21/14  8:45 AM      Result Value Ref Range   Cholesterol 110  0 - 200 mg/dL   Triglycerides 63  <328 mg/dL   HDL 40  >12  mg/dL   Total CHOL/HDL Ratio 2.8     VLDL 13  0 - 40 mg/dL   LDL Cholesterol 57  0 - 99 mg/dL   Comment:            Total Cholesterol/HDL:CHD Risk     Coronary Heart Disease Risk Table                         Men   Women      1/2 Average Risk   3.4   3.3      Average Risk       5.0   4.4      2 X Average Risk   9.6   7.1      3 X Average Risk  23.4   11.0                Use the calculated Patient Ratio     above and the CHD Risk Table     to determine the patient's CHD Risk.                ATP III CLASSIFICATION (LDL):      <100     mg/dL   Optimal      727-549  mg/dL   Near or Above                        Optimal      130-159  mg/dL   Borderline      632-112  mg/dL   High      >699     mg/dL   Very High  CBC     Status: Abnormal   Collection Time    04/21/14  8:45 AM      Result Value Ref Range   WBC 12.3 (*) 4.0 - 10.5 K/uL   RBC 3.46 (*) 4.22 - 5.81 MIL/uL   Hemoglobin 10.3 (*) 13.0 - 17.0 g/dL   HCT 88.3 (*) 71.1 - 02.1 %   MCV 93.1  78.0 - 100.0 fL   MCH 29.8  26.0 - 34.0 pg   MCHC 32.0  30.0 - 36.0 g/dL   RDW 75.8 (*) 17.9 - 97.9 %   Platelets 350  150 - 400 K/uL  BASIC METABOLIC PANEL     Status: Abnormal   Collection Time    04/21/14  8:45 AM      Result Value Ref Range   Sodium 140  137 - 147 mEq/L   Potassium 3.4 (*) 3.7 - 5.3 mEq/L   Chloride 99  96 - 112 mEq/L   CO2 24  19 - 32 mEq/L   Glucose, Bld 204 (*) 70 - 99 mg/dL   BUN 70 (*) 6 - 23 mg/dL   Creatinine, Ser 7.06 (*) 0.50 - 1.35 mg/dL   Calcium 9.5  8.4 - 20.6 mg/dL   GFR calc non Af Amer 9 (*) >90 mL/min   GFR calc Af Amer 11 (*) >90 mL/min   Comment: (NOTE)     The eGFR has been calculated using the CKD EPI equation.     This calculation  has not been validated in all clinical situations.     eGFR's persistently <90 mL/min signify possible Chronic Kidney     Disease.  PROTIME-INR     Status: None   Collection Time    04/21/14  8:45 AM      Result Value Ref Range   Prothrombin Time 14.5   11.6 - 15.2 seconds   INR 1.15  0.00 - 1.49  PRO B NATRIURETIC PEPTIDE     Status: Abnormal   Collection Time    04/21/14  8:45 AM      Result Value Ref Range   Pro B Natriuretic peptide (BNP) 5741.0 (*) 0 - 450 pg/mL  TROPONIN I     Status: Abnormal   Collection Time    04/21/14  8:45 AM      Result Value Ref Range   Troponin I 3.90 (*) <0.30 ng/mL   Comment:            Due to the release kinetics of cTnI,     a negative result within the first hours     of the onset of symptoms does not rule out     myocardial infarction with certainty.     If myocardial infarction is still suspected,     repeat the test at appropriate intervals.     CRITICAL RESULT CALLED TO, READ BACK BY AND VERIFIED WITH:     B.HALL,RN 1472 04/21/14 M.CAMPBELL  GLUCOSE, CAPILLARY     Status: Abnormal   Collection Time    04/21/14 11:32 AM      Result Value Ref Range   Glucose-Capillary 218 (*) 70 - 99 mg/dL  HEPARIN LEVEL (UNFRACTIONATED)     Status: None   Collection Time    04/21/14 12:00 PM      Result Value Ref Range   Heparin Unfractionated 0.37  0.30 - 0.70 IU/mL   Comment:            IF HEPARIN RESULTS ARE BELOW     EXPECTED VALUES, AND PATIENT     DOSAGE HAS BEEN CONFIRMED,     SUGGEST FOLLOW UP TESTING     OF ANTITHROMBIN III LEVELS.  TROPONIN I     Status: Abnormal   Collection Time    04/21/14  2:10 PM      Result Value Ref Range   Troponin I 3.96 (*) <0.30 ng/mL   Comment:            Due to the release kinetics of cTnI,     a negative result within the first hours     of the onset of symptoms does not rule out     myocardial infarction with certainty.     If myocardial infarction is still suspected,     repeat the test at appropriate intervals.     CRITICAL VALUE NOTED.  VALUE IS CONSISTENT WITH PREVIOUSLY REPORTED AND CALLED VALUE.  GLUCOSE, CAPILLARY     Status: Abnormal   Collection Time    04/21/14  4:21 PM      Result Value Ref Range   Glucose-Capillary 195 (*) 70 - 99 mg/dL    Dg Chest 2 View  9/92/9942   CLINICAL DATA:  Sharp left chest pain, cough, COPD  EXAM: CHEST  2 VIEW  COMPARISON:  02/02/2014  FINDINGS: Chronic interstitial markings/emphysematous changes. No focal consolidation. No pleural effusion or pneumothorax.  The heart is normal in size. Postsurgical changes related to prior CABG.  Degenerative changes  of the visualized thoracolumbar spine.  IMPRESSION: No evidence of acute cardiopulmonary disease.   Electronically Signed   By: Julian Hy M.D.   On: 04/21/2014 02:20    Assessment:  1 Stage 5 CKD with high risk for ESRD short term with or without heart cath 2 NSTEMI Rec: 1 If cath is planned, one should proceed with expectation of needing dialysis.   2 Usual precautions  3 Education 4 He may need a perm cath if urgent dialysis needed.  We will need advice regarding operative clearance for a PD catheter for a later date.  Esta Carmon C 04/21/2014, 5:47 PM

## 2014-04-21 NOTE — Progress Notes (Addendum)
Critical lab value troponin 3.90 called to MD. No orders received at this time

## 2014-04-21 NOTE — Progress Notes (Signed)
ANTICOAGULATION CONSULT NOTE - Initial Consult  Pharmacy Consult for Heparin Indication: chest pain/ACS  Allergies  Allergen Reactions  . Codeine Nausea And Vomiting  . Dilaudid [Hydromorphone Hcl] Nausea And Vomiting    "I get deathly sick"; diaphoresis    Patient Measurements: Height: 5\' 6"  (167.6 cm) Weight: 150 lb (68.04 kg) IBW/kg (Calculated) : 63.8  Vital Signs: Temp: 98.1 F (36.7 C) (06/13 0049) Temp src: Oral (06/13 0049) BP: 160/84 mmHg (06/13 0200) Pulse Rate: 94 (06/13 0200)  Labs:  Recent Labs  04/21/14 0100  HGB 11.1*  HCT 34.9*  PLT 377  CREATININE 5.06*    Estimated Creatinine Clearance: 10.2 ml/min (by C-G formula based on Cr of 5.06).   Medical History: Past Medical History  Diagnosis Date  . COPD (chronic obstructive pulmonary disease)   . Hypertension   . CAD (coronary artery disease), autologous vein bypass graft   . High cholesterol   . Acute chest pain 08/18/2012    "cardiologist said it was not my heart" (08/18/2012)  . Pneumonia 1938  . Shortness of breath     "related to the COPD" (08/18/2012)  . Type II diabetes mellitus   . History of blood transfusion 1938  . Colon cancer   . Chronic kidney disease, stage 4 (severe)   . Arthritis     "back, hands, neck" (08/18/2012)  . History of pancreatitis ~ 1985  . Anginal pain     Medications:  Albuterol  ASA  Colace  Cardura  Epogen  Advair  Lasix  NPH  SSI  Imdur  Creon  MVI  Ntg  Prilosec  Zemplar  Quinine  Zocor  Spiriva  Ultram  Assessment: 80 yo male with chest pain for heparin  Goal of Therapy:  Heparin level 0.3-0.7 units/ml Monitor platelets by anticoagulation protocol: Yes   Plan:  Heparin 4000 units IV bolus, then start heparin 800 units/hr as ordered by MD Check heparin level in 8 hours.   Lucretia Pendley, Bronson Curb 04/21/2014,2:35 AM

## 2014-04-21 NOTE — ED Notes (Signed)
Chest pain. Started ~ 2200 when wife was giving pt. Meds. Substernal cp, radiating to left arm. O2 dependent - 2 L. Pt. Took 6 ntg. Sl 0.4 mg. Cp now 1/10. ems did not give pt. 324 mg. Of asa.

## 2014-04-21 NOTE — Progress Notes (Signed)
ANTICOAGULATION CONSULT NOTE - Follow Up Consult  Pharmacy Consult for Heparin Indication: chest pain/ACS  Allergies  Allergen Reactions  . Codeine Nausea And Vomiting  . Dilaudid [Hydromorphone Hcl] Nausea And Vomiting    "I get deathly sick"; diaphoresis    Patient Measurements: Height: 5\' 6"  (167.6 cm) Weight: 144 lb 13.5 oz (65.7 kg) IBW/kg (Calculated) : 63.8  Vital Signs: Temp: 97.6 F (36.4 C) (06/13 1519) Temp src: Oral (06/13 1519) BP: 119/95 mmHg (06/13 1519) Pulse Rate: 67 (06/13 1519)  Labs:  Recent Labs  04/21/14 0100 04/21/14 0845 04/21/14 1200 04/21/14 1410  HGB 11.1* 10.3*  --   --   HCT 34.9* 32.2*  --   --   PLT 377 350  --   --   LABPROT  --  14.5  --   --   INR  --  1.15  --   --   HEPARINUNFRC  --   --  0.37  --   CREATININE 5.06* 5.12*  --   --   TROPONINI  --  3.90*  --  3.96*    Estimated Creatinine Clearance: 10 ml/min (by C-G formula based on Cr of 5.12).   Medical History: Past Medical History  Diagnosis Date  . COPD (chronic obstructive pulmonary disease)   . Hypertension   . CAD (coronary artery disease), autologous vein bypass graft   . High cholesterol   . Acute chest pain 08/18/2012    "cardiologist said it was not my heart" (08/18/2012)  . Pneumonia 1938  . Shortness of breath     "related to the COPD" (08/18/2012)  . Type II diabetes mellitus   . History of blood transfusion 1938  . Colon cancer   . Chronic kidney disease, stage 4 (severe)   . Arthritis     "back, hands, neck" (08/18/2012)  . History of pancreatitis ~ 1985  . Anginal pain       Assessment: 80 yo male admitted with  chest pain Tp up to 3.96.  Started on heparin drip 800 uts/hr HL 0.37 at goal, no bleeding, cbc stable.    Goal of Therapy:  Heparin level 0.3-0.7 units/ml Monitor platelets by anticoagulation protocol: Yes   Plan:  Continue  heparin 800 units/hr  Daily HL, CBC   Bonnita Nasuti Pharm.D. CPP, BCPS Clinical  Pharmacist [phone] 04/21/2014 5:08 PM

## 2014-04-21 NOTE — Progress Notes (Signed)
  Echocardiogram 2D Echocardiogram has been performed.  Raymond [name] 04/21/2014, 11:46 AM

## 2014-04-22 DIAGNOSIS — J438 Other emphysema: Secondary | ICD-10-CM

## 2014-04-22 DIAGNOSIS — E1165 Type 2 diabetes mellitus with hyperglycemia: Principal | ICD-10-CM

## 2014-04-22 DIAGNOSIS — N184 Chronic kidney disease, stage 4 (severe): Secondary | ICD-10-CM

## 2014-04-22 DIAGNOSIS — IMO0001 Reserved for inherently not codable concepts without codable children: Secondary | ICD-10-CM | POA: Insufficient documentation

## 2014-04-22 LAB — GLUCOSE, CAPILLARY
GLUCOSE-CAPILLARY: 279 mg/dL — AB (ref 70–99)
GLUCOSE-CAPILLARY: 314 mg/dL — AB (ref 70–99)
GLUCOSE-CAPILLARY: 176 mg/dL — AB (ref 70–99)
Glucose-Capillary: 61 mg/dL — ABNORMAL LOW (ref 70–99)
Glucose-Capillary: 66 mg/dL — ABNORMAL LOW (ref 70–99)
Glucose-Capillary: 87 mg/dL (ref 70–99)

## 2014-04-22 LAB — CBC
HCT: 29.7 % — ABNORMAL LOW (ref 39.0–52.0)
Hemoglobin: 9.4 g/dL — ABNORMAL LOW (ref 13.0–17.0)
MCH: 29.4 pg (ref 26.0–34.0)
MCHC: 31.6 g/dL (ref 30.0–36.0)
MCV: 92.8 fL (ref 78.0–100.0)
Platelets: 315 10*3/uL (ref 150–400)
RBC: 3.2 MIL/uL — ABNORMAL LOW (ref 4.22–5.81)
RDW: 15.8 % — ABNORMAL HIGH (ref 11.5–15.5)
WBC: 11.1 10*3/uL — ABNORMAL HIGH (ref 4.0–10.5)

## 2014-04-22 LAB — HEPARIN LEVEL (UNFRACTIONATED): HEPARIN UNFRACTIONATED: 0.34 [IU]/mL (ref 0.30–0.70)

## 2014-04-22 LAB — TROPONIN I: Troponin I: 1.59 ng/mL (ref <0.30)

## 2014-04-22 NOTE — Progress Notes (Signed)
ANTICOAGULATION CONSULT NOTE - Follow Up Consult  Pharmacy Consult for Heparin Indication: chest pain/ACS  Allergies  Allergen Reactions  . Codeine Nausea And Vomiting  . Dilaudid [Hydromorphone Hcl] Nausea And Vomiting    "I get deathly sick"; diaphoresis    Patient Measurements: Height: 5\' 6"  (167.6 cm) Weight: 143 lb 8.3 oz (65.1 kg) IBW/kg (Calculated) : 63.8  Vital Signs: Temp: 98.6 F (37 C) (06/14 1428) Temp src: Oral (06/14 1428) BP: 142/62 mmHg (06/14 1428) Pulse Rate: 83 (06/14 1428)  Labs:  Recent Labs  04/21/14 0100  04/21/14 0845 04/21/14 1200 04/21/14 1410 04/21/14 2022 04/22/14 0308 04/22/14 1042  HGB 11.1*  --  10.3*  --   --   --  9.4*  --   HCT 34.9*  --  32.2*  --   --   --  29.7*  --   PLT 377  --  350  --   --   --  315  --   LABPROT  --   --  14.5  --   --   --   --   --   INR  --   --  1.15  --   --   --   --   --   HEPARINUNFRC  --   --   --  0.37  --   --  0.34  --   CREATININE 5.06*  --  5.12*  --   --   --   --   --   TROPONINI  --   < > 3.90*  --  3.96* 3.06*  --  1.59*  < > = values in this interval not displayed.  Estimated Creatinine Clearance: 10 ml/min (by C-G formula based on Cr of 5.12).   Medical History: Past Medical History  Diagnosis Date  . COPD (chronic obstructive pulmonary disease)   . Hypertension   . CAD (coronary artery disease), autologous vein bypass graft   . High cholesterol   . Acute chest pain 08/18/2012    "cardiologist said it was not my heart" (08/18/2012)  . Pneumonia 1938  . Shortness of breath     "related to the COPD" (08/18/2012)  . Type II diabetes mellitus   . History of blood transfusion 1938  . Colon cancer   . Chronic kidney disease, stage 4 (severe)   . Arthritis     "back, hands, neck" (08/18/2012)  . History of pancreatitis ~ 1985  . Anginal pain       Assessment: 80 yo male admitted with  chest pain Tp peak 3.96>1.59.  Started on heparin drip 800 uts/hr HL 0.34 at goal, no  bleeding, cbc stable.  BP stable 140/60 SR 80s - Asa, statin, imdur, BB, furosemide  Goal of Therapy:  Heparin level 0.3-0.7 units/ml Monitor platelets by anticoagulation protocol: Yes   Plan:  Continue  heparin 800 units/hr  Daily HL, CBC   Bonnita Nasuti Pharm.D. CPP, BCPS Clinical Pharmacist [phone] 04/22/2014 3:02 PM

## 2014-04-22 NOTE — Progress Notes (Signed)
Utilization Review Completed.Raymond [name] T6/14/2015  

## 2014-04-22 NOTE — Progress Notes (Signed)
Hypoglycemic Event  CBG: 61  Treatment: 15 GM carbohydrate snack  Symptoms: None  Follow-up CBG: Time: CBG Result:66  Possible Reasons for Event: Unknown  Comments/MD notified: second 15 gm carbohydrate given with repeat CBG 87. AM insulin held.    Raymond [name] D  Remember to initiate Hypoglycemia Order Set & complete

## 2014-04-22 NOTE — Progress Notes (Signed)
Primary cardiologist: Dr. Pernell Dupre  Subjective:   No chest pain or shortness of breath at rest.   Objective:   Temp:  [97.6 F (36.4 C)-97.7 F (36.5 C)] 97.6 F (36.4 C) (06/14 0539) Pulse Rate:  [66-75] 75 (06/14 0539) Resp:  [18] 18 (06/14 0539) BP: (113-135)/(59-95) 135/66 mmHg (06/14 0539) SpO2:  [98 %-100 %] 98 % (06/14 0300) Weight:  [143 lb 8.3 oz (65.1 kg)] 143 lb 8.3 oz (65.1 kg) (06/14 0539) Last BM Date: 04/21/14  Filed Weights   04/21/14 0048 04/21/14 0647 04/22/14 0539  Weight: 150 lb (68.04 kg) 144 lb 13.5 oz (65.7 kg) 143 lb 8.3 oz (65.1 kg)    Intake/Output Summary (Last 24 hours) at 04/22/14 0942 Last data filed at 04/22/14 0700  Gross per 24 hour  Intake    784 ml  Output   1075 ml  Net   -291 ml    Telemetry: Sinus rhythm with PACs.  Exam:  General: Appears comfortable.  Lungs: Decreased breath sounds, nonlabored breathing.  Cardiac: Regular with ectopy, 2/6 systolic murmur at the base.  Extremities: No pitting.   Lab Results:  Basic Metabolic Panel:  Recent Labs Lab 04/21/14 0100 04/21/14 0845  NA 139 140  K 3.9 3.4*  CL 96 99  CO2 22 24  GLUCOSE 230* 204*  BUN 71* 70*  CREATININE 5.06* 5.12*  CALCIUM 9.8 9.5    CBC:  Recent Labs Lab 04/21/14 0100 04/21/14 0845 04/22/14 0308  WBC 18.3* 12.3* 11.1*  HGB 11.1* 10.3* 9.4*  HCT 34.9* 32.2* 29.7*  MCV 93.6 93.1 92.8  PLT 377 350 315    Cardiac Enzymes:  Recent Labs Lab 04/21/14 0845 04/21/14 1410 04/21/14 2022  TROPONINI 3.90* 3.96* 3.06*    BNP:  Recent Labs  04/21/14 0845  PROBNP 5741.0*    Coagulation:  Recent Labs Lab 04/21/14 0845  INR 1.15    ECG: Sinus rhythm with prolonged PR interval, LVH with repolarization abnormalities, and prolonged QT.   Echocardiogram (04/21/14) Study Conclusions  - Left ventricle: The cavity size was normal. Wall thickness was increased in a pattern of mild LVH. Systolic function was normal. The  estimated ejection fraction was in the range of 55% to 60%. There is hypokinesis of the basalinferolateral myocardium. Doppler parameters are consistent with abnormal left ventricular relaxation (grade 1 diastolic dysfunction). - Aortic valve: Moderately calcified annulus. Probably trileaflet; moderately calcified leaflets. Cusp separation was reduced. There was probable severe stenosis, although somewhat discordant information. Gradients are more consistent with moderate aortic stenosis, and the LVOT/AV VTI ratio is less than 25% more consistent with severe aortic stenosis. Mean gradient (S): 21 mm Hg. Peak gradient (S): 39 mm Hg. - Mitral valve: Severely calcified annulus. There was mild regurgitation. - Left atrium: The atrium was mildly dilated. - Right atrium: The atrium was mildly dilated. Central venous pressure (est): 3 mm Hg. - Tricuspid valve: There was trivial regurgitation. - Pulmonary arteries: Systolic pressure could not be accurately estimated. - Pericardium, extracardiac: There was no pericardial effusion.  Impressions:  - Mild LVH with LVEF 55-60%, basal inferolateral hypokinesis noted, grade 1 diastolic dysfunction. Severe MAC with mild mitral regurgitation. Mild left atrial enlargement. Probable severe calcific aortic stenosis, somewhat discordant information as detailed above however. Trivial tricuspid regurgitation, unable to assess PASP.    Medications:   Scheduled Medications: . aspirin EC  81 mg Oral Daily  . atorvastatin  80 mg Oral q1800  . docusate sodium  100 mg  Oral Daily  . doxazosin  4 mg Oral Daily  . furosemide  80 mg Oral BID  . insulin aspart  2 Units Subcutaneous QAC supper  . insulin aspart  4 Units Subcutaneous QAC breakfast  . insulin NPH Human  22 Units Subcutaneous QAC breakfast  . isosorbide mononitrate  60 mg Oral Daily  . lipase/protease/amylase  2 capsule Oral TID WC  . metoprolol  75 mg Oral BID  . mometasone-formoterol  2  puff Inhalation BID  . multivitamin with minerals  1 tablet Oral Daily  . pantoprazole  80 mg Oral Daily  . paricalcitol  1 mcg Oral QODAY  . tiotropium  18 mcg Inhalation Daily    Infusions: . heparin 800 Units/hr (04/22/14 0330)    PRN Medications: albuterol, nitroGLYCERIN, traMADol   Assessment:   1. NSTEMI. Initial troponin I 3.9. Currently chest pain-free on heparin. Followup troponin I level being ordered.  2. CAD status post prior CABG. LVEF 65-70% in 2013.  3. Moderate to severe aortic stenosis by followup echocardiogram (see above report).  4. CKD, stage 5. Creatinine 5.0-5.1. Patient follows with Dr. Moshe Cipro. He has not required hemodialysis so far. Appreciate followup nephrology consultation.  5. Hyperlipidemia, on statin therapy.  6. Type 2 diabetes mellitus.  7. Hypertension.  8. COPD.   Plan/Discussion:    Complex situation. Appreciate input from Nephrology. Would recommend discussion among patient, Dr. Tamala Julian, and Dr. Moshe Cipro regarding next step in his management. Cardiac catheterization could be considered, although patient would need to plan on likely starting dialysis thereafter. Bigger issue to consider also is the fact that he may not have straight forward revascularization options, and his aortic stenosis is something will have to be dealt with as well. Not certain that he is a very good candidate for an open operation. Continue medical therapy including heparin. Followup cardiac markers to be obtained.   Raymond [name], M.D., F.A.C.C.

## 2014-04-22 NOTE — Progress Notes (Signed)
Assessment:  1 Stage 5 CKD with high risk for ESRD short term with or without heart cath  2 NSTEMI  Rec:  1 If cath is planned, one should proceed with expectation of needing dialysis.  2 Usual precautions  3 Education  4 He may need a perm cath if urgent dialysis needed. We will need advice regarding operative clearance for a PD catheter for a later date. 5 Will let Dr. Moshe Cipro know of pt's hospitalization  Subjective: Interval History: No cp  Objective: Vital signs in last 24 hours: Temp:  [97.6 F (36.4 C)-97.7 F (36.5 C)] 97.6 F (36.4 C) (06/14 0539) Pulse Rate:  [66-75] 75 (06/14 0539) Resp:  [18] 18 (06/14 0539) BP: (113-135)/(59-95) 135/66 mmHg (06/14 0539) SpO2:  [98 %-100 %] 98 % (06/14 0300) Weight:  [65.1 kg (143 lb 8.3 oz)] 65.1 kg (143 lb 8.3 oz) (06/14 0539) Weight change: -2.94 kg (-6 lb 7.7 oz)  Intake/Output from previous day: 06/13 0701 - 06/14 0700 In: 784 [P.O.:720; I.V.:64] Out: 1075 [Urine:1075] Intake/Output this shift:    General appearance: alert and cooperative Resp: clear to auscultation bilaterally Chest wall: no tenderness Cardio: regular rate and rhythm, S1, S2 normal, no murmur, click, rub or gallop Extremities: extremities normal, atraumatic, no cyanosis or edema  Lab Results:  Recent Labs  04/21/14 0845 04/22/14 0308  WBC 12.3* 11.1*  HGB 10.3* 9.4*  HCT 32.2* 29.7*  PLT 350 315   BMET:  Recent Labs  04/21/14 0100 04/21/14 0845  NA 139 140  K 3.9 3.4*  CL 96 99  CO2 22 24  GLUCOSE 230* 204*  BUN 71* 70*  CREATININE 5.06* 5.12*  CALCIUM 9.8 9.5   No results found for this basename: PTH,  in the last 72 hours Iron Studies: No results found for this basename: IRON, TIBC, TRANSFERRIN, FERRITIN,  in the last 72 hours Studies/Results: Dg Chest 2 View  04/21/2014   CLINICAL DATA:  Sharp left chest pain, cough, COPD  EXAM: CHEST  2 VIEW  COMPARISON:  02/02/2014  FINDINGS: Chronic interstitial markings/emphysematous  changes. No focal consolidation. No pleural effusion or pneumothorax.  The heart is normal in size. Postsurgical changes related to prior CABG.  Degenerative changes of the visualized thoracolumbar spine.  IMPRESSION: No evidence of acute cardiopulmonary disease.   Electronically Signed   By: Julian Hy M.D.   On: 04/21/2014 02:20   Scheduled: . aspirin EC  81 mg Oral Daily  . atorvastatin  80 mg Oral q1800  . docusate sodium  100 mg Oral Daily  . doxazosin  4 mg Oral Daily  . furosemide  80 mg Oral BID  . insulin aspart  2 Units Subcutaneous QAC supper  . insulin aspart  4 Units Subcutaneous QAC breakfast  . insulin NPH Human  22 Units Subcutaneous QAC breakfast  . isosorbide mononitrate  60 mg Oral Daily  . lipase/protease/amylase  2 capsule Oral TID WC  . metoprolol  75 mg Oral BID  . mometasone-formoterol  2 puff Inhalation BID  . multivitamin with minerals  1 tablet Oral Daily  . pantoprazole  80 mg Oral Daily  . paricalcitol  1 mcg Oral QODAY  . tiotropium  18 mcg Inhalation Daily     LOS: 1 day   Eduard Penkala C 04/22/2014,9:30 AM

## 2014-04-23 DIAGNOSIS — I214 Non-ST elevation (NSTEMI) myocardial infarction: Secondary | ICD-10-CM

## 2014-04-23 LAB — GLUCOSE, CAPILLARY
GLUCOSE-CAPILLARY: 62 mg/dL — AB (ref 70–99)
Glucose-Capillary: 102 mg/dL — ABNORMAL HIGH (ref 70–99)
Glucose-Capillary: 189 mg/dL — ABNORMAL HIGH (ref 70–99)
Glucose-Capillary: 231 mg/dL — ABNORMAL HIGH (ref 70–99)
Glucose-Capillary: 193 mg/dL — ABNORMAL HIGH (ref 70–99)

## 2014-04-23 LAB — RENAL FUNCTION PANEL
ALBUMIN: 2.6 g/dL — AB (ref 3.5–5.2)
BUN: 74 mg/dL — ABNORMAL HIGH (ref 6–23)
CHLORIDE: 97 meq/L (ref 96–112)
CO2: 20 meq/L (ref 19–32)
Calcium: 9.5 mg/dL (ref 8.4–10.5)
Creatinine, Ser: 5.29 mg/dL — ABNORMAL HIGH (ref 0.50–1.35)
GFR calc Af Amer: 11 mL/min — ABNORMAL LOW (ref >90)
GFR, EST NON AFRICAN AMERICAN: 9 mL/min — AB (ref >90)
Glucose, Bld: 60 mg/dL — ABNORMAL LOW (ref 70–99)
POTASSIUM: 3.1 meq/L — AB (ref 3.7–5.3)
Phosphorus: 6 mg/dL — ABNORMAL HIGH (ref 2.3–4.6)
SODIUM: 137 meq/L (ref 137–147)

## 2014-04-23 LAB — HEPARIN LEVEL (UNFRACTIONATED)
HEPARIN UNFRACTIONATED: 0.35 [IU]/mL (ref 0.30–0.70)
Heparin Unfractionated: 0.4 IU/mL (ref 0.30–0.70)

## 2014-04-23 LAB — CBC
HCT: 30.5 % — ABNORMAL LOW (ref 39.0–52.0)
Hemoglobin: 10 g/dL — ABNORMAL LOW (ref 13.0–17.0)
MCH: 30 pg (ref 26.0–34.0)
MCHC: 32.8 g/dL (ref 30.0–36.0)
MCV: 91.6 fL (ref 78.0–100.0)
PLATELETS: 320 10*3/uL (ref 150–400)
RBC: 3.33 MIL/uL — AB (ref 4.22–5.81)
RDW: 15.6 % — ABNORMAL HIGH (ref 11.5–15.5)
WBC: 11.8 10*3/uL — AB (ref 4.0–10.5)

## 2014-04-23 LAB — URINE CULTURE: Special Requests: NORMAL

## 2014-04-23 NOTE — Progress Notes (Signed)
SUBJECTIVE:  No complaints  OBJECTIVE:   Vitals:   Filed Vitals:   04/22/14 1428 04/22/14 2100 04/22/14 2102 04/23/14 0500  BP: 142/62 122/62  130/60  Pulse: 83 68  74  Temp: 98.6 F (37 C) 98.2 F (36.8 C)  97.5 F (36.4 C)  TempSrc: Oral   Oral  Resp: 18 16  18   Height:      Weight:    144 lb 13.5 oz (65.7 kg)  SpO2: 93% 97% 97% 95%   I&O's:   Intake/Output Summary (Last 24 hours) at 04/23/14 0817 Last data filed at 04/23/14 0500  Gross per 24 hour  Intake    480 ml  Output   2600 ml  Net  -2120 ml   TELEMETRY: Reviewed telemetry pt in NSR:     PHYSICAL EXAM General: Well developed, well nourished, in no acute distress Head: Eyes PERRLA, No xanthomas.   Normal cephalic and atramatic  Lungs:   Clear bilaterally to auscultation and percussion. Heart:   HRRR S1 S2 Pulses are 2+ & equal. Abdomen: Bowel sounds are positive, abdomen soft and non-tender without masses  Extremities:   No clubbing, cyanosis or edema.  DP +1 Neuro: Alert and oriented X 3. Psych:  Good affect, responds appropriately   LABS: Basic Metabolic Panel:  Recent Labs  04/21/14 0845 04/23/14 0414  NA 140 137  K 3.4* 3.1*  CL 99 97  CO2 24 20  GLUCOSE 204* 60*  BUN 70* 74*  CREATININE 5.12* 5.29*  CALCIUM 9.5 9.5  PHOS  --  6.0*   Liver Function Tests:  Recent Labs  04/23/14 0414  ALBUMIN 2.6*   No results found for this basename: LIPASE, AMYLASE,  in the last 72 hours CBC:  Recent Labs  04/22/14 0308 04/23/14 0414  WBC 11.1* 11.8*  HGB 9.4* 10.0*  HCT 29.7* 30.5*  MCV 92.8 91.6  PLT 315 320   Cardiac Enzymes:  Recent Labs  04/21/14 1410 04/21/14 2022 04/22/14 1042  TROPONINI 3.96* 3.06* 1.59*   BNP: No components found with this basename: POCBNP,  D-Dimer: No results found for this basename: DDIMER,  in the last 72 hours Hemoglobin A1C: No results found for this basename: HGBA1C,  in the last 72 hours Fasting Lipid Panel:  Recent Labs  04/21/14 0845    CHOL 110  HDL 40  LDLCALC 57  TRIG 63  CHOLHDL 2.8   Thyroid Function Tests: No results found for this basename: TSH, T4TOTAL, FREET3, T3FREE, THYROIDAB,  in the last 72 hours Anemia Panel: No results found for this basename: VITAMINB12, FOLATE, FERRITIN, TIBC, IRON, RETICCTPCT,  in the last 72 hours Coag Panel:   Lab Results  Component Value Date   INR 1.15 04/21/2014    RADIOLOGY: Dg Chest 2 View  04/21/2014   CLINICAL DATA:  Sharp left chest pain, cough, COPD  EXAM: CHEST  2 VIEW  COMPARISON:  02/02/2014  FINDINGS: Chronic interstitial markings/emphysematous changes. No focal consolidation. No pleural effusion or pneumothorax.  The heart is normal in size. Postsurgical changes related to prior CABG.  Degenerative changes of the visualized thoracolumbar spine.  IMPRESSION: No evidence of acute cardiopulmonary disease.   Electronically Signed   By: Julian Hy M.D.   On: 04/21/2014 02:20   Assessment:   1. NSTEMI. Initial troponin I 3.9. Troponin now trending downward. Currently chest pain-free on heparin.  2. CAD status post prior CABG. LVEF 65-70% in 2013.  3. Moderate to severe aortic stenosis by followup  echocardiogram (see above report).  4. CKD, stage 5. Creatinine 5.0-5.2. Patient follows with Dr. Moshe Cipro. He has not required hemodialysis so far. Appreciate followup nephrology consultation.  5. Hyperlipidemia, on statin therapy.  6. Type 2 diabetes mellitus.  7. Hypertension.  8. COPD.  Plan/Discussion:   Complex situation. Appreciate input from Nephrology. Patient does not want to make any decisions until personally  discussion among patient, Dr. Tamala Julian, and Dr. Moshe Cipro regarding next step in his management. Cardiac catheterization could be considered, although patient would need to plan on likely starting dialysis thereafter. Bigger issue to consider also is the fact that he may not have straight forward revascularization options, and his aortic stenosis is  something will have to be dealt with as well. Not certain that he is a very good candidate for an open operation. Continue medical therapy including heparin. I have spoken with Dr. Tamala Julian and he will talk directly to patient today to make a decision on further management.     Sueanne Margarita, MD  04/23/2014  8:17 AM

## 2014-04-23 NOTE — Progress Notes (Signed)
ANTICOAGULATION CONSULT NOTE - Follow Up Consult  Pharmacy Consult for heparin Indication: chest pain/ACS  Allergies  Allergen Reactions  . Codeine Nausea And Vomiting  . Dilaudid [Hydromorphone Hcl] Nausea And Vomiting    "I get deathly sick"; diaphoresis    Patient Measurements: Height: 5\' 6"  (167.6 cm) Weight: 144 lb 13.5 oz (65.7 kg) IBW/kg (Calculated) : 63.8  Vital Signs: Temp: 98.3 F (36.8 C) (06/15 1432) Temp src: Oral (06/15 1432) BP: 117/67 mmHg (06/15 1432) Pulse Rate: 83 (06/15 1432)  Labs:  Recent Labs  04/21/14 0100  04/21/14 0845  04/21/14 1410 04/21/14 2022 04/22/14 0308 04/22/14 1042 04/23/14 0414 04/23/14 1415  HGB 11.1*  --  10.3*  --   --   --  9.4*  --  10.0*  --   HCT 34.9*  --  32.2*  --   --   --  29.7*  --  30.5*  --   PLT 377  --  350  --   --   --  315  --  320  --   LABPROT  --   --  14.5  --   --   --   --   --   --   --   INR  --   --  1.15  --   --   --   --   --   --   --   HEPARINUNFRC  --   --   --   < >  --   --  0.34  --  <0.10* 0.35  CREATININE 5.06*  --  5.12*  --   --   --   --   --  5.29*  --   TROPONINI  --   < > 3.90*  --  3.96* 3.06*  --  1.59*  --   --   < > = values in this interval not displayed.  Estimated Creatinine Clearance: 9.7 ml/min (by C-G formula based on Cr of 5.29).   Medications:  Scheduled:  . aspirin EC  81 mg Oral Daily  . atorvastatin  80 mg Oral q1800  . docusate sodium  100 mg Oral Daily  . doxazosin  4 mg Oral Daily  . furosemide  80 mg Oral BID  . insulin aspart  2 Units Subcutaneous QAC supper  . insulin aspart  4 Units Subcutaneous QAC breakfast  . insulin NPH Human  22 Units Subcutaneous QAC breakfast  . isosorbide mononitrate  60 mg Oral Daily  . lipase/protease/amylase  2 capsule Oral TID WC  . metoprolol  75 mg Oral BID  . mometasone-formoterol  2 puff Inhalation BID  . multivitamin with minerals  1 tablet Oral Daily  . pantoprazole  80 mg Oral Daily  . paricalcitol  1 mcg Oral  QODAY  . tiotropium  18 mcg Inhalation Daily   Infusions:  . heparin 950 Units/hr (04/23/14 1016)    Assessment: 80 yo male with NSTEMI on heparin and now at goal after increase to 950 units/hr (HL= 0.35).   Goal of Therapy:  Heparin level 0.3-0.7 units/ml Monitor platelets by anticoagulation protocol: Yes   Plan:  -No heparin changes needed -Will re-check a heparin level today to confirm  Hildred Laser, Pharm D 04/23/2014 2:45 PM

## 2014-04-23 NOTE — Progress Notes (Signed)
ANTICOAGULATION CONSULT NOTE - Follow Up Consult  Pharmacy Consult for heparin Indication: chest pain/ACS  Allergies  Allergen Reactions  . Codeine Nausea And Vomiting  . Dilaudid [Hydromorphone Hcl] Nausea And Vomiting    "I get deathly sick"; diaphoresis    Patient Measurements: Height: 5\' 6"  (167.6 cm) Weight: 144 lb 13.5 oz (65.7 kg) IBW/kg (Calculated) : 63.8  Vital Signs: Temp: 98.3 F (36.8 C) (06/15 1432) Temp src: Oral (06/15 1432) BP: 118/66 mmHg (06/15 1559) Pulse Rate: 83 (06/15 1432)  Labs:  Recent Labs  04/21/14 0100  04/21/14 0845  04/21/14 1410 04/21/14 2022 04/22/14 0308 04/22/14 1042 04/23/14 0414 04/23/14 1415 04/23/14 2026  HGB 11.1*  --  10.3*  --   --   --  9.4*  --  10.0*  --   --   HCT 34.9*  --  32.2*  --   --   --  29.7*  --  30.5*  --   --   PLT 377  --  350  --   --   --  315  --  320  --   --   LABPROT  --   --  14.5  --   --   --   --   --   --   --   --   INR  --   --  1.15  --   --   --   --   --   --   --   --   HEPARINUNFRC  --   --   --   < >  --   --  0.34  --  <0.10* 0.35 0.40  CREATININE 5.06*  --  5.12*  --   --   --   --   --  5.29*  --   --   TROPONINI  --   < > 3.90*  --  3.96* 3.06*  --  1.59*  --   --   --   < > = values in this interval not displayed.  Estimated Creatinine Clearance: 9.7 ml/min (by C-G formula based on Cr of 5.29).   Medications:  Scheduled:  . aspirin EC  81 mg Oral Daily  . atorvastatin  80 mg Oral q1800  . docusate sodium  100 mg Oral Daily  . doxazosin  4 mg Oral Daily  . furosemide  80 mg Oral BID  . insulin aspart  2 Units Subcutaneous QAC supper  . insulin aspart  4 Units Subcutaneous QAC breakfast  . insulin NPH Human  22 Units Subcutaneous QAC breakfast  . isosorbide mononitrate  60 mg Oral Daily  . lipase/protease/amylase  2 capsule Oral TID WC  . metoprolol  75 mg Oral BID  . mometasone-formoterol  2 puff Inhalation BID  . multivitamin with minerals  1 tablet Oral Daily  .  pantoprazole  80 mg Oral Daily  . paricalcitol  1 mcg Oral QODAY  . tiotropium  18 mcg Inhalation Daily   Infusions:  . heparin 950 Units/hr (04/23/14 1016)    Assessment: 80 yo male with NSTEMI on heparin. Confirm level is therapeutic tonight.    Goal of Therapy:  Heparin level 0.3-0.7 units/ml Monitor platelets by anticoagulation protocol: Yes   Plan:   Cont heparin at 950 units/hr Daily level and CBC

## 2014-04-23 NOTE — Progress Notes (Signed)
Pt claims to have 5/10 dull left upper arm pain that is "the same as when I came in with a heartattack", without any radiation to the chest, 1 sublingual nitro given, after 5 minutes pt stated "I dont know if its better maybe like a 4,5, or a 6 on a scale." pt refused another nitro, EKG preformed, pt claimed the same pain earlier today and a Tramadol was given and pt went to sleep, will continue to monitor, vitals stable, NSR on the monitor Rickard Rhymes, RN

## 2014-04-23 NOTE — Progress Notes (Signed)
ANTICOAGULATION CONSULT NOTE - Initial Consult  Pharmacy Consult for Heparin  Indication: chest pain/ACS  Allergies  Allergen Reactions  . Codeine Nausea And Vomiting  . Dilaudid [Hydromorphone Hcl] Nausea And Vomiting    "I get deathly sick"; diaphoresis    Patient Measurements: Height: 5\' 6"  (167.6 cm) Weight: 144 lb 13.5 oz (65.7 kg) IBW/kg (Calculated) : 63.8  Vital Signs: Temp: 97.5 F (36.4 C) (06/15 0500) Temp src: Oral (06/15 0500) BP: 130/60 mmHg (06/15 0500) Pulse Rate: 74 (06/15 0500)  Labs:  Recent Labs  04/21/14 0100  04/21/14 0845 04/21/14 1200 04/21/14 1410 04/21/14 2022 04/22/14 0308 04/22/14 1042 04/23/14 0414  HGB 11.1*  --  10.3*  --   --   --  9.4*  --  10.0*  HCT 34.9*  --  32.2*  --   --   --  29.7*  --  30.5*  PLT 377  --  350  --   --   --  315  --  320  LABPROT  --   --  14.5  --   --   --   --   --   --   INR  --   --  1.15  --   --   --   --   --   --   HEPARINUNFRC  --   --   --  0.37  --   --  0.34  --  <0.10*  CREATININE 5.06*  --  5.12*  --   --   --   --   --  5.29*  TROPONINI  --   < > 3.90*  --  3.96* 3.06*  --  1.59*  --   < > = values in this interval not displayed.  Estimated Creatinine Clearance: 9.7 ml/min (by C-G formula based on Cr of 5.29).   Medical History: Past Medical History  Diagnosis Date  . COPD (chronic obstructive pulmonary disease)   . Hypertension   . CAD (coronary artery disease), autologous vein bypass graft   . High cholesterol   . Acute chest pain 08/18/2012    "cardiologist said it was not my heart" (08/18/2012)  . Pneumonia 1938  . Shortness of breath     "related to the COPD" (08/18/2012)  . Type II diabetes mellitus   . History of blood transfusion 1938  . Colon cancer   . Chronic kidney disease, stage 4 (severe)   . Arthritis     "back, hands, neck" (08/18/2012)  . History of pancreatitis ~ 1985  . Anginal pain     Medications:  Heparin 800 units/hr  Assessment: 80 y/o M on  heparin for CP, HL undetectable this AM, no line issues per RN, other labs as above, noted renal dysfunction.   Goal of Therapy:  Heparin level 0.3-0.7 units/ml Monitor platelets by anticoagulation protocol: Yes   Plan:  -Increase heparin cautiously to 950 units/hr in older gentleman with decreased renal function  -1400 HL -Daily CBC/HL -Monitor for bleeding  Narda Bonds 04/23/2014,6:02 AM

## 2014-04-23 NOTE — Progress Notes (Signed)
Assessment:  80 yo WM with a background of CAD, COPD, advanced CKD (followed by Dr. Moshe Cipro and has wanted to do PD as his outpt option when reaching ESRD) who is admitted with NSTEMI, and we are asked to see  Stage 5 CKD with high risk for ESRD short term with or without heart cath - pt has wanted to do PD as his dialysis option, which is why he has no permanent access.  He wants to speak with Dr. Moshe Cipro (she is aware and will call him today) but after my conversation with him, I think he would be amenable to cath/tunnelled dialysis catheter for HD if needed post cath  (with or without cardiace surgery).  If CABG and valve planned, would be nice to get AVF AND TDC done pre CABG if not soon thereafter.  Would VERY likely require HD if CABG, and understands I think that he would have to get cardiac clearance for any other procedures in the future such as PD catheter.   NSTEMI - per cards Mod to severe AS - per cards DM HTN HLD COPD Secondary hyperpara - on hectorol  Subjective: Interval History:  Wants to talk to Dr.Smith and to Dr. Moshe Cipro about his cardiac situation (what he REALLY wants is to go home)  Objective: Vital signs in last 24 hours: Temp:  [97.5 F (36.4 C)-98.6 F (37 C)] 97.5 F (36.4 C) (06/15 0500) Pulse Rate:  [68-90] 90 (06/15 0820) Resp:  [16-18] 18 (06/15 0500) BP: (122-142)/(60-71) 129/71 mmHg (06/15 0820) SpO2:  [93 %-97 %] 95 % (06/15 0500) Weight:  [65.7 kg (144 lb 13.5 oz)] 65.7 kg (144 lb 13.5 oz) (06/15 0500) Weight change: 0.6 kg (1 lb 5.2 oz)  Intake/Output from previous day: 06/14 0701 - 06/15 0700 In: 480 [P.O.:480] Out: 2600 [Urine:2600] Intake/Output this shift: Total I/O In: 240 [P.O.:240] Out: -   General appearance: alert and cooperative, somewhat hard of hearing but very nice BP 129/71  Pulse 90  Temp(Src) 97.5 F (36.4 C) (Oral)  Resp 18  Ht 5\' 6"  (1.676 m)  Wt 65.7 kg (144 lb 13.5 oz)  BMI 23.39 kg/m2  SpO2  95% Resp: clear to auscultation bilaterally Chest wall: no tenderness Cardio: regular rate and rhythm, S1, S2 normal, no murmur, click, rub or gallop Extremities: extremities normal, atraumatic, no cyanosis or edema of the lower extremities  Recent Labs  04/22/14 0308 04/23/14 0414  WBC 11.1* 11.8*  HGB 9.4* 10.0*  HCT 29.7* 30.5*  PLT 315 320   Recent Labs  04/21/14 0845 04/23/14 0414  NA 140 137  K 3.4* 3.1*  CL 99 97  CO2 24 20  GLUCOSE 204* 60*  BUN 70* 74*  CREATININE 5.12* 5.29*  CALCIUM 9.5 9.5   Studies/Results: No results found. Scheduled: . aspirin EC  81 mg Oral Daily  . atorvastatin  80 mg Oral q1800  . docusate sodium  100 mg Oral Daily  . doxazosin  4 mg Oral Daily  . furosemide  80 mg Oral BID  . insulin aspart  2 Units Subcutaneous QAC supper  . insulin aspart  4 Units Subcutaneous QAC breakfast  . insulin NPH Human  22 Units Subcutaneous QAC breakfast  . isosorbide mononitrate  60 mg Oral Daily  . lipase/protease/amylase  2 capsule Oral TID WC  . metoprolol  75 mg Oral BID  . mometasone-formoterol  2 puff Inhalation BID  . multivitamin with minerals  1 tablet Oral Daily  . pantoprazole  80  mg Oral Daily  . paricalcitol  1 mcg Oral QODAY  . tiotropium  18 mcg Inhalation Daily   . heparin 950 Units/hr (04/23/14 1016)     LOS: 2 days   Anastassia Noack B 04/23/2014,2:14 PM

## 2014-04-24 ENCOUNTER — Inpatient Hospital Stay (HOSPITAL_COMMUNITY): Payer: Managed Care, Other (non HMO)

## 2014-04-24 ENCOUNTER — Encounter: Payer: Self-pay | Admitting: Interventional Cardiology

## 2014-04-24 LAB — GLUCOSE, CAPILLARY
GLUCOSE-CAPILLARY: 222 mg/dL — AB (ref 70–99)
GLUCOSE-CAPILLARY: 242 mg/dL — AB (ref 70–99)
Glucose-Capillary: 170 mg/dL — ABNORMAL HIGH (ref 70–99)
Glucose-Capillary: 105 mg/dL — ABNORMAL HIGH (ref 70–99)

## 2014-04-24 LAB — CBC
HEMATOCRIT: 30.8 % — AB (ref 39.0–52.0)
Hemoglobin: 10.1 g/dL — ABNORMAL LOW (ref 13.0–17.0)
MCH: 29.8 pg (ref 26.0–34.0)
MCHC: 32.8 g/dL (ref 30.0–36.0)
MCV: 90.9 fL (ref 78.0–100.0)
Platelets: 313 10*3/uL (ref 150–400)
RBC: 3.39 MIL/uL — ABNORMAL LOW (ref 4.22–5.81)
RDW: 15.8 % — ABNORMAL HIGH (ref 11.5–15.5)
WBC: 10.8 10*3/uL — AB (ref 4.0–10.5)

## 2014-04-24 LAB — RENAL FUNCTION PANEL
ALBUMIN: 2.6 g/dL — AB (ref 3.5–5.2)
BUN: 76 mg/dL — AB (ref 6–23)
CHLORIDE: 95 meq/L — AB (ref 96–112)
CO2: 20 mEq/L (ref 19–32)
CREATININE: 5.6 mg/dL — AB (ref 0.50–1.35)
Calcium: 10.1 mg/dL (ref 8.4–10.5)
GFR calc Af Amer: 10 mL/min — ABNORMAL LOW (ref >90)
GFR calc non Af Amer: 8 mL/min — ABNORMAL LOW (ref >90)
Glucose, Bld: 189 mg/dL — ABNORMAL HIGH (ref 70–99)
Phosphorus: 6.3 mg/dL — ABNORMAL HIGH (ref 2.3–4.6)
Potassium: 3.4 mEq/L — ABNORMAL LOW (ref 3.7–5.3)
Sodium: 136 mEq/L — ABNORMAL LOW (ref 137–147)

## 2014-04-24 LAB — HEPARIN LEVEL (UNFRACTIONATED): Heparin Unfractionated: 0.56 IU/mL (ref 0.30–0.70)

## 2014-04-24 MED ORDER — SEVELAMER CARBONATE 800 MG PO TABS
800.0000 mg | ORAL_TABLET | Freq: Three times a day (TID) | ORAL | Status: DC
  Administered 2014-04-24 – 2014-04-30 (×13): 800 mg via ORAL
  Filled 2014-04-24 (×20): qty 1

## 2014-04-24 MED ORDER — SODIUM CHLORIDE 0.9 % IV SOLN: INTRAVENOUS | Status: DC

## 2014-04-24 MED ORDER — HEPARIN SODIUM (PORCINE) 1000 UNIT/ML IJ SOLN
3000.0000 [IU] | Freq: Once | INTRAMUSCULAR | Status: DC
  Filled 2014-04-24: qty 3

## 2014-04-24 MED ORDER — ASPIRIN 81 MG PO CHEW
81.0000 mg | CHEWABLE_TABLET | ORAL | Status: DC
  Filled 2014-04-24: qty 1

## 2014-04-24 NOTE — Progress Notes (Signed)
Assessment:  80 yo WM with a background of CAD, COPD, advanced CKD (followed by Dr. Moshe Cipro and has wanted to do PD as his outpt option when reaching ESRD) who is admitted with NSTEMI, and we were asked to see  Stage 5 CKD with high risk for ESRD short term with or without heart cath - pt has wanted to do PD as his dialysis option, which is why he has no permanent access.  He has spoken with me, Dr. Moshe Cipro (by phone) Dr. Radford Pax, and Dr. Tamala Julian - wants to proceed with aggressive approach, including cath and surgery as well as dialysis if needed. I agree with Dr. Tamala Julian that he should have a reliable access in place prior to angiography being done. Pt is agreeable. I note that there are plans for heart cath tomorrow.  Have placed a call to Dr. Radford Pax - he will need [facility] placed by either IR or [facility] before going to cath.  I am unsure if she has made these arrangements as of yet. Await her call back.          NSTEMI - per cards Mod to severe AS - per cards DM HTN HLD COPD Secondary hyperpara - on hectorol; add renvela for hyperphos  Subjective: Interval History:  Had arm pain all day yesterday but no chest pain A little SOB this afternoon but O2 sats 97% Says "let's get this ball rolling  Objective: Vital signs in last 24 hours: Temp:  [97.1 F (36.2 C)-98.3 F (36.8 C)] 97.1 F (36.2 C) (06/16 0512) Pulse Rate:  [76-87] 83 (06/16 0802) Resp:  [18] 18 (06/16 0512) BP: (109-130)/(50-67) 130/57 mmHg (06/16 0802) SpO2:  [94 %-98 %] 97 % (06/16 1320) Weight change:   Intake/Output from previous day: 06/15 0701 - 06/16 0700 In: 600 [P.O.:600] Out: 1000 [Urine:1000] Intake/Output this shift: Total I/O In: 240 [P.O.:240] Out: 500 [Urine:500]  General appearance: alert and cooperative, somewhat hard of hearing but very nice BP 130/57  Pulse 83  Temp(Src) 97.1 F (36.2 C) (Oral)  Resp 18  Ht 5\' 6"  (1.676 m)  Wt 65.7 kg (144 lb 13.5 oz)  BMI 23.39 kg/m2  SpO2 97% Resp: No  crackles at this time Chest wall: no tenderness; old sternotomy scar Cardio: regular rate and rhythm, S1, S2 normal  Systolic murmur aortic area and at apex Extremities: extremities normal, atraumatic, no cyanosis or edema of the lower extremities  Recent Labs  04/23/14 0414 04/24/14 0404  WBC 11.8* 10.8*  HGB 10.0* 10.1*  HCT 30.5* 30.8*  PLT 320 313    Recent Labs  04/23/14 0414 04/24/14 0404  NA 137 136*  K 3.1* 3.4*  CL 97 95*  CO2 20 20  GLUCOSE 60* 189*  BUN 74* 76*  CREATININE 5.29* 5.60*  CALCIUM 9.5 10.1   Studies/Results: No results found. Scheduled: . aspirin EC  81 mg Oral Daily  . atorvastatin  80 mg Oral q1800  . docusate sodium  100 mg Oral Daily  . doxazosin  4 mg Oral Daily  . furosemide  80 mg Oral BID  . insulin aspart  2 Units Subcutaneous QAC supper  . insulin aspart  4 Units Subcutaneous QAC breakfast  . insulin NPH Human  22 Units Subcutaneous QAC breakfast  . isosorbide mononitrate  60 mg Oral Daily  . lipase/protease/amylase  2 capsule Oral TID WC  . metoprolol  75 mg Oral BID  . mometasone-formoterol  2 puff Inhalation BID  . multivitamin with minerals  1 tablet Oral Daily  . pantoprazole  80 mg Oral Daily  . paricalcitol  1 mcg Oral QODAY  . tiotropium  18 mcg Inhalation Daily   . heparin 950 Units/hr (04/23/14 1016)     LOS: 3 days   Margarette Vannatter B 04/24/2014,1:24 PM

## 2014-04-24 NOTE — Procedures (Signed)
Hemodialysis Catheter Insertion Procedure Note Raymond [name] [id number] 07-06-1930  Procedure: Insertion of Hemodialysis Catheter Indications: Hemodialysis  Procedure Details Consent: Risks of procedure as well as the alternatives and risks of each were explained to the (patient/caregiver).  Consent for procedure obtained. Time Out: Verified patient identification, verified procedure, site/side was marked, verified correct patient position, special equipment/implants available, medications/allergies/relevent history reviewed, required imaging and test results available.  Performed  Maximum sterile technique was used including antiseptics, cap, gloves, gown, hand hygiene, mask and sheet. Skin prep: Chlorhexidine; local anesthetic administered A  triple lumen HD catheter was placed in the right internal jugular vein using the Seldinger technique.  Evaluation Blood flow good Complications: No apparent complications Patient did tolerate procedure well. Chest X-ray ordered to verify placement.  CXR: pending.   Procedure performed per Dr. Titus [name] and under ultrasound guidance.   04/24/2014, 4:22 PM  US guidances Less 1 cc blood loss Hep held 35 min prior  Raymond [name]. Raymond Mould, MD, [place] Pgr: [facility]

## 2014-04-24 NOTE — Consult Note (Signed)
Interventional Consult   Spoke to patient at length. He has decided to have aggressive management of his underlying cardiac problems. In the past he has been more conservative minded. We discussed Hospice and Palliative Care but he is not interested currently. He understands the high risk and the possibility that he may need repeat CABG/AVR ( ?may not be a candidate). TAVR/ PCI is also a possibility.  He should have dependable dialysis access placed prior to coronary angiography. He is stable and abler to undergo that process.

## 2014-04-24 NOTE — Care Management Note (Addendum)
    Page 1 of 1   04/30/2014     2:31:20 PM CARE MANAGEMENT NOTE 04/30/2014  Patient:  Raymond [name], Raymond [name]   Account Number:  [id number]  Date Initiated:  04/24/2014  Documentation initiated by:  Dino Borntreger  Subjective/Objective Assessment:   Pt adm on 04/21/14 with CP, NSTEMI, CKD.  PTA, pt resides at home with spouse.     Action/Plan:   Will follow for dc needs as pt progresses.   Anticipated DC Date:  04/30/2014   Anticipated DC Plan:  [place]  CM consult      [facility] Choice  HOME HEALTH   Choice offered to / List presented to:  C-1 Patient        [place] arranged  HH-1 RN  [facility].   Status of service:  Completed, signed off Medicare Important Message given?  YES (If response is "NO", the following Medicare IM given date fields will be blank) Date Medicare IM given:  04/22/2014 Date Additional Medicare IM given:  04/30/2014  Discharge Disposition:  [place]  Per UR Regulation:  Reviewed for med. necessity/level of care/duration of stay  If discussed at [place] of Stay Meetings, dates discussed:    Comments:  04/30/14 Ellan Lambert, RN, BSN [phone] Pt for dc home today with wife.  Would benefit from [facility] follow up, and orders requested from PA.  Referral to [facility], per pt/wife choice.  Start of care 24-48h post dc date.  Pt declines offer of RW for home, states has cane at home.  Crystal Hutchinson RN, BSN, MSHL, CCM  Nurse - Case Manager, (Unit [phone]  04/27/2014 Socail:  From home with wife. Med Review:  Plavix

## 2014-04-24 NOTE — Progress Notes (Signed)
See my earlier prog note. Spoke with Dr. Radford Pax She indicates plan is for Dr. Tamala Julian to cath patient as add on today (rather than tomorrow) She will ask CCM to place temp HD cath so we have something in place before pt has angiogram Bessy Reaney B, MD

## 2014-04-24 NOTE — Progress Notes (Signed)
ANTICOAGULATION CONSULT NOTE - Follow Up Consult  Pharmacy Consult for heparin Indication: chest pain/ACS  Allergies  Allergen Reactions  . Codeine Nausea And Vomiting  . Dilaudid [Hydromorphone Hcl] Nausea And Vomiting    "I get deathly sick"; diaphoresis    Patient Measurements: Height: 5\' 6"  (167.6 cm) Weight: 144 lb 13.5 oz (65.7 kg) IBW/kg (Calculated) : 63.8  Vital Signs: Temp: 97.1 F (36.2 C) (06/16 0512) Temp src: Oral (06/16 0512) BP: 130/57 mmHg (06/16 0802) Pulse Rate: 83 (06/16 0802)  Labs:  Recent Labs  04/21/14 1410 04/21/14 2022  04/22/14 0308 04/22/14 1042 04/23/14 0414 04/23/14 1415 04/23/14 2026 04/24/14 0404  HGB  --   --   < > 9.4*  --  10.0*  --   --  10.1*  HCT  --   --   --  29.7*  --  30.5*  --   --  30.8*  PLT  --   --   --  315  --  320  --   --  313  HEPARINUNFRC  --   --   --  0.34  --  <0.10* 0.35 0.40 0.56  CREATININE  --   --   --   --   --  5.29*  --   --  5.60*  TROPONINI 3.96* 3.06*  --   --  1.59*  --   --   --   --   < > = values in this interval not displayed.  Estimated Creatinine Clearance: 9.2 ml/min (by C-G formula based on Cr of 5.6).   Medications:  Scheduled:  . aspirin EC  81 mg Oral Daily  . atorvastatin  80 mg Oral q1800  . docusate sodium  100 mg Oral Daily  . doxazosin  4 mg Oral Daily  . furosemide  80 mg Oral BID  . insulin aspart  2 Units Subcutaneous QAC supper  . insulin aspart  4 Units Subcutaneous QAC breakfast  . insulin NPH Human  22 Units Subcutaneous QAC breakfast  . isosorbide mononitrate  60 mg Oral Daily  . lipase/protease/amylase  2 capsule Oral TID WC  . metoprolol  75 mg Oral BID  . mometasone-formoterol  2 puff Inhalation BID  . multivitamin with minerals  1 tablet Oral Daily  . pantoprazole  80 mg Oral Daily  . paricalcitol  1 mcg Oral QODAY  . tiotropium  18 mcg Inhalation Daily   Infusions:  . heparin 950 Units/hr (04/23/14 1016)    Assessment: 80 yo male with NSTEMI on  heparin and at goal on 950 units/hr (HL= 0.56). Patient noted for cath in am.  Goal of Therapy:  Heparin level 0.3-0.7 units/ml Monitor platelets by anticoagulation protocol: Yes   Plan:  -No heparin changes needed -Daily CBC and heparin level   Hildred Laser, Pharm D 04/24/2014 10:22 AM

## 2014-04-24 NOTE — Progress Notes (Addendum)
SUBJECTIVE:  Had some arm pain last night  OBJECTIVE:   Vitals:   Filed Vitals:   04/23/14 2147 04/23/14 2216 04/24/14 0512 04/24/14 0802  BP: 109/54  128/50 130/57  Pulse: 87  76 83  Temp: 98.3 F (36.8 C)  97.1 F (36.2 C)   TempSrc: Oral  Oral   Resp: 18  18   Height:      Weight:      SpO2: 94% 96% 96%    I&O's:   Intake/Output Summary (Last 24 hours) at 04/24/14 0847 Last data filed at 04/24/14 0330  Gross per 24 hour  Intake    360 ml  Output   1000 ml  Net   -640 ml   TELEMETRY: Reviewed telemetry pt in NSR:     PHYSICAL EXAM General: Well developed, well nourished, in no acute distress Head: Eyes PERRLA, No xanthomas.   Normal cephalic and atramatic  Lungs:   Clear bilaterally to auscultation and percussion. Heart:   HRRR S1 S2 Pulses are 2+ & equal. Abdomen: Bowel sounds are positive, abdomen soft and non-tender without masses  Extremities:   No clubbing, cyanosis or edema.  DP +1 Neuro: Alert and oriented X 3. Psych:  Good affect, responds appropriately   LABS: Basic Metabolic Panel:  Recent Labs  04/23/14 0414 04/24/14 0404  NA 137 136*  K 3.1* 3.4*  CL 97 95*  CO2 20 20  GLUCOSE 60* 189*  BUN 74* 76*  CREATININE 5.29* 5.60*  CALCIUM 9.5 10.1  PHOS 6.0* 6.3*   Liver Function Tests:  Recent Labs  04/23/14 0414 04/24/14 0404  ALBUMIN 2.6* 2.6*   No results found for this basename: LIPASE, AMYLASE,  in the last 72 hours CBC:  Recent Labs  04/23/14 0414 04/24/14 0404  WBC 11.8* 10.8*  HGB 10.0* 10.1*  HCT 30.5* 30.8*  MCV 91.6 90.9  PLT 320 313   Cardiac Enzymes:  Recent Labs  04/21/14 1410 04/21/14 2022 04/22/14 1042  TROPONINI 3.96* 3.06* 1.59*   BNP: No components found with this basename: POCBNP,  D-Dimer: No results found for this basename: DDIMER,  in the last 72 hours Hemoglobin A1C: No results found for this basename: HGBA1C,  in the last 72 hours Fasting Lipid Panel: No results found for this basename:  CHOL, HDL, LDLCALC, TRIG, CHOLHDL, LDLDIRECT,  in the last 72 hours Thyroid Function Tests: No results found for this basename: TSH, T4TOTAL, FREET3, T3FREE, THYROIDAB,  in the last 72 hours Anemia Panel: No results found for this basename: VITAMINB12, FOLATE, FERRITIN, TIBC, IRON, RETICCTPCT,  in the last 72 hours Coag Panel:   Lab Results  Component Value Date   INR 1.15 04/21/2014    RADIOLOGY: Dg Chest 2 View  04/21/2014   CLINICAL DATA:  Sharp left chest pain, cough, COPD  EXAM: CHEST  2 VIEW  COMPARISON:  02/02/2014  FINDINGS: Chronic interstitial markings/emphysematous changes. No focal consolidation. No pleural effusion or pneumothorax.  The heart is normal in size. Postsurgical changes related to prior CABG.  Degenerative changes of the visualized thoracolumbar spine.  IMPRESSION: No evidence of acute cardiopulmonary disease.   Electronically Signed   By: Julian Hy M.D.   On: 04/21/2014 02:20   Assessment:   1. NSTEMI. Initial troponin I 3.9. Troponin now trending downward. Currently chest pain-free on heparin.  2. CAD status post prior CABG. LVEF 65-70% in 2013.  3. Moderate to severe aortic stenosis by followup echocardiogram (see above report).  4. CKD, stage  5. Creatinine 5.0-5.6. Patient follows with Dr. Moshe Cipro. He has not required hemodialysis so far. Appreciate followup nephrology consultation.  5. Hyperlipidemia, on statin therapy.  6. Type 2 diabetes mellitus.  7. Hypertension.  8. COPD.  9.  ? UTI with large leuk est positive, many bacteria and 11-20 WBC's.  Culture read out >100,000 colonies of mixed bacteria ? Contaminant.  Will repeat urine culture. Plan/Discussion:   Complex situation. Appreciate input from Nephrology. Patient has spoken to Dr. Tamala Julian and wants to take an aggressive approach to treatment. He wants to proceed with cath knowing the he will need dialysis afterwards.  He understands that the procedure is high risk and may need CABG/TAVR.  Will  place on cath schedule for tomorrow.   Sueanne Margarita, MD  04/24/2014  8:47 AM

## 2014-04-25 ENCOUNTER — Encounter (HOSPITAL_COMMUNITY)
Admission: EM | Disposition: A | Discharge status: Home / Self Care | Payer: Self-pay | Source: Home / Self Care | Attending: Interventional Cardiology

## 2014-04-25 DIAGNOSIS — I2510 Atherosclerotic heart disease of native coronary artery without angina pectoris: Secondary | ICD-10-CM

## 2014-04-25 HISTORY — PX: LEFT AND RIGHT HEART CATHETERIZATION WITH CORONARY/GRAFT ANGIOGRAM: SHX5448

## 2014-04-25 LAB — CBC
HCT: 31 % — ABNORMAL LOW (ref 39.0–52.0)
Hemoglobin: 10.2 g/dL — ABNORMAL LOW (ref 13.0–17.0)
MCH: 30.1 pg (ref 26.0–34.0)
MCHC: 32.9 g/dL (ref 30.0–36.0)
MCV: 91.4 fL (ref 78.0–100.0)
PLATELETS: 291 10*3/uL (ref 150–400)
RBC: 3.39 MIL/uL — ABNORMAL LOW (ref 4.22–5.81)
RDW: 15.6 % — AB (ref 11.5–15.5)
WBC: 11.5 10*3/uL — ABNORMAL HIGH (ref 4.0–10.5)

## 2014-04-25 LAB — GLUCOSE, CAPILLARY
GLUCOSE-CAPILLARY: 123 mg/dL — AB (ref 70–99)
GLUCOSE-CAPILLARY: 145 mg/dL — AB (ref 70–99)
GLUCOSE-CAPILLARY: 295 mg/dL — AB (ref 70–99)
Glucose-Capillary: 209 mg/dL — ABNORMAL HIGH (ref 70–99)

## 2014-04-25 LAB — POCT I-STAT 3, ART BLOOD GAS (G3+)
Acid-base deficit: 1 mmol/L (ref 0.0–2.0)
Bicarbonate: 23.5 mEq/L (ref 20.0–24.0)
O2 Saturation: 93 %
PCO2 ART: 37.4 mmHg (ref 35.0–45.0)
TCO2: 25 mmol/L (ref 0–100)
pH, Arterial: 7.406 (ref 7.350–7.450)
pO2, Arterial: 68 mmHg — ABNORMAL LOW (ref 80.0–100.0)

## 2014-04-25 LAB — POCT I-STAT 3, VENOUS BLOOD GAS (G3P V)
ACID-BASE DEFICIT: 3 mmol/L — AB (ref 0.0–2.0)
Bicarbonate: 21.6 mEq/L (ref 20.0–24.0)
O2 SAT: 53 %
PCO2 VEN: 38.3 mmHg — AB (ref 45.0–50.0)
TCO2: 23 mmol/L (ref 0–100)
pH, Ven: 7.359 — ABNORMAL HIGH (ref 7.250–7.300)
pO2, Ven: 29 mmHg — CL (ref 30.0–45.0)

## 2014-04-25 LAB — URINE CULTURE

## 2014-04-25 LAB — RENAL FUNCTION PANEL
ALBUMIN: 2.7 g/dL — AB (ref 3.5–5.2)
BUN: 73 mg/dL — ABNORMAL HIGH (ref 6–23)
CO2: 21 mEq/L (ref 19–32)
Calcium: 9.8 mg/dL (ref 8.4–10.5)
Chloride: 99 mEq/L (ref 96–112)
Creatinine, Ser: 5.55 mg/dL — ABNORMAL HIGH (ref 0.50–1.35)
GFR calc non Af Amer: 9 mL/min — ABNORMAL LOW (ref >90)
GFR, EST AFRICAN AMERICAN: 10 mL/min — AB (ref >90)
Glucose, Bld: 125 mg/dL — ABNORMAL HIGH (ref 70–99)
PHOSPHORUS: 5.9 mg/dL — AB (ref 2.3–4.6)
POTASSIUM: 3.4 meq/L — AB (ref 3.7–5.3)
SODIUM: 140 meq/L (ref 137–147)

## 2014-04-25 LAB — HEPARIN LEVEL (UNFRACTIONATED): HEPARIN UNFRACTIONATED: 0.41 [IU]/mL (ref 0.30–0.70)

## 2014-04-25 SURGERY — LEFT AND RIGHT HEART CATHETERIZATION WITH CORONARY/GRAFT ANGIOGRAM

## 2014-04-25 MED ORDER — SODIUM CHLORIDE 0.9 % IV SOLN
1.0000 mL/kg/h | INTRAVENOUS | Status: AC
  Administered 2014-04-25 (×2): 1 mL/kg/h via INTRAVENOUS

## 2014-04-25 MED ORDER — NITROGLYCERIN 0.2 MG/ML ON CALL CATH LAB
INTRAVENOUS | Status: AC
  Filled 2014-04-25: qty 1

## 2014-04-25 MED ORDER — POTASSIUM CHLORIDE CRYS ER 20 MEQ PO TBCR
20.0000 meq | EXTENDED_RELEASE_TABLET | Freq: Once | ORAL | Status: AC
  Administered 2014-04-25: 20 meq via ORAL
  Filled 2014-04-25: qty 1

## 2014-04-25 MED ORDER — LIDOCAINE HCL (PF) 1 % IJ SOLN
INTRAMUSCULAR | Status: AC
  Filled 2014-04-25: qty 30

## 2014-04-25 MED ORDER — MIDAZOLAM HCL 2 MG/2ML IJ SOLN
INTRAMUSCULAR | Status: AC
  Filled 2014-04-25: qty 2

## 2014-04-25 MED ORDER — HEPARIN (PORCINE) IN NACL 2-0.9 UNIT/ML-% IJ SOLN
INTRAMUSCULAR | Status: AC
  Filled 2014-04-25: qty 1000

## 2014-04-25 MED ORDER — HEPARIN (PORCINE) IN NACL 100-0.45 UNIT/ML-% IJ SOLN
1000.0000 [IU]/h | INTRAMUSCULAR | Status: DC
  Administered 2014-04-25: 950 [IU]/h via INTRAVENOUS
  Administered 2014-04-27: 1000 [IU]/h via INTRAVENOUS
  Filled 2014-04-25 (×3): qty 250

## 2014-04-25 NOTE — Progress Notes (Signed)
Pt received into room 2w33 from cath lab, pt alert and awake, pt aware of bedrest till 1730, pt stated understanding, groin soft dry and intact, pt instructed to call for any change in breathing or groin, will continue to monitor Rickard Rhymes, RN

## 2014-04-25 NOTE — H&P (View-Only) (Signed)
SUBJECTIVE:  Denies any chest pain or SOB  OBJECTIVE:   Vitals:   Filed Vitals:   04/24/14 2134 04/25/14 0455 04/25/14 0722 04/25/14 0756  BP:  132/67 135/61   Pulse:  73 76   Temp:  97.5 F (36.4 C)    TempSrc:  Oral    Resp:  18    Height:      Weight:      SpO2: 95% 95%  97%   I&O's:   Intake/Output Summary (Last 24 hours) at 04/25/14 9147 Last data filed at 04/25/14 0600  Gross per 24 hour  Intake    480 ml  Output   2500 ml  Net  -2020 ml   TELEMETRY: Reviewed telemetry pt in NSR:14 beat run of atrial tachycardia     PHYSICAL EXAM General: Well developed, well nourished, in no acute distress Head: Eyes PERRLA, No xanthomas.   Normal cephalic and atramatic  Lungs:   Clear bilaterally to auscultation and percussion. Heart:   HRRR S1 S2 Pulses are 2+ & equal. Abdomen: Bowel sounds are positive, abdomen soft and non-tender without masses Extremities:   No clubbing, cyanosis or edema.  DP +1 Neuro: Alert and oriented X 3. Psych:  Good affect, responds appropriately   LABS: Basic Metabolic Panel:  Recent Labs  04/24/14 0404 04/25/14 0608  NA 136* 140  K 3.4* 3.4*  CL 95* 99  CO2 20 21  GLUCOSE 189* 125*  BUN 76* 73*  CREATININE 5.60* 5.55*  CALCIUM 10.1 9.8  PHOS 6.3* 5.9*   Liver Function Tests:  Recent Labs  04/24/14 0404 04/25/14 0608  ALBUMIN 2.6* 2.7*   No results found for this basename: LIPASE, AMYLASE,  in the last 72 hours CBC:  Recent Labs  04/24/14 0404 04/25/14 0608  WBC 10.8* 11.5*  HGB 10.1* 10.2*  HCT 30.8* 31.0*  MCV 90.9 91.4  PLT 313 291   Cardiac Enzymes:  Recent Labs  04/22/14 1042  TROPONINI 1.59*   BNP: No components found with this basename: POCBNP,  D-Dimer: No results found for this basename: DDIMER,  in the last 72 hours Hemoglobin A1C: No results found for this basename: HGBA1C,  in the last 72 hours Fasting Lipid Panel: No results found for this basename: CHOL, HDL, LDLCALC, TRIG, CHOLHDL,  LDLDIRECT,  in the last 72 hours Thyroid Function Tests: No results found for this basename: TSH, T4TOTAL, FREET3, T3FREE, THYROIDAB,  in the last 72 hours Anemia Panel: No results found for this basename: VITAMINB12, FOLATE, FERRITIN, TIBC, IRON, RETICCTPCT,  in the last 72 hours Coag Panel:   Lab Results  Component Value Date   INR 1.15 04/21/2014    RADIOLOGY: Dg Chest 2 View  04/21/2014   CLINICAL DATA:  Sharp left chest pain, cough, COPD  EXAM: CHEST  2 VIEW  COMPARISON:  02/02/2014  FINDINGS: Chronic interstitial markings/emphysematous changes. No focal consolidation. No pleural effusion or pneumothorax.  The heart is normal in size. Postsurgical changes related to prior CABG.  Degenerative changes of the visualized thoracolumbar spine.  IMPRESSION: No evidence of acute cardiopulmonary disease.   Electronically Signed   By: Julian Hy M.D.   On: 04/21/2014 02:20   Dg Chest Port 1 View  04/24/2014   CLINICAL DATA:  Status post placement dialysis catheter.  EXAM: PORTABLE CHEST - 1 VIEW  COMPARISON:  PA and lateral chest 04/21/2014.  FINDINGS: Right IJ approach central venous catheter is in place with the tips in the mid superior vena cava.  There is no pneumothorax. The lungs appear emphysematous. The patient is status post CABG. Heart size is upper normal.  IMPRESSION: Right IJ catheter tip projects over the mid superior vena cava. Negative for pneumothorax.  No acute cardiopulmonary disease.  Emphysema.   Electronically Signed   By: Inge Rise M.D.   On: 04/24/2014 16:57   Assessment:   1. NSTEMI. Initial troponin I 3.9. Troponin now trending downward. Currently chest pain-free on heparin.  2. CAD status post prior CABG. LVEF 65-70% in 2013.  3. Moderate to severe aortic stenosis by followup echocardiogram (see above report).  4. CKD, stage 5. Creatinine 5.0-5.6. Patient follows with Dr. Moshe Cipro. He has not required hemodialysis so far. Appreciate followup nephrology  consultation.  5. Hyperlipidemia, on statin therapy.  6. Type 2 diabetes mellitus.  7. Hypertension.  8. COPD.  9. ? UTI with large leuk est positive, many bacteria and 11-20 WBC's. Culture read out >100,000 colonies of mixed bacteria ? Contaminant. Will repeat urine culture.  10.  Nonsustained atrial tachycardia. Continue BB for now. Plan/Discussion:   Complex situation. Appreciate input from Nephrology. Patient has spoken to Dr. Tamala Julian and wants to take an aggressive approach to treatment. He wants to proceed with cath knowing the he will need dialysis afterwards. He understands that the procedure is high risk and may need CABG/TAVR. For Cath today. Temporary HD catheter placed yesterday.        Sueanne Margarita, MD  04/25/2014  9:06 AM

## 2014-04-25 NOTE — Interval H&P Note (Signed)
History and Physical Interval Note:  04/25/2014 11:27 AM  Raymond [name]  has presented today for surgery, with the diagnosis of NSTEMI  The various methods of treatment have been discussed with the patient and family. After consideration of risks, benefits and other options for treatment, the patient has consented to  Procedure(s): LEFT AND RIGHT HEART CATHETERIZATION WITH CORONARY/GRAFT ANGIOGRAM as a surgical intervention .  The patient's history has been reviewed, patient examined, no change in status, stable for surgery.  I have reviewed the patient's chart and labs.  Questions were answered to the patient's satisfaction.     Collier Salina [facility] 04/25/2014 11:27 AM Cath Lab Visit (complete for each Cath Lab visit)  Clinical Evaluation Leading to the Procedure:   ACS: yes  Non-ACS:    Anginal Classification: CCS IV  Anti-ischemic medical therapy: Minimal Therapy (1 class of medications)  Non-Invasive Test Results: No non-invasive testing performed  Prior CABG: Previous CABG

## 2014-04-25 NOTE — Progress Notes (Addendum)
SUBJECTIVE:  Denies any chest pain or SOB  OBJECTIVE:   Vitals:   Filed Vitals:   04/24/14 2134 04/25/14 0455 04/25/14 0722 04/25/14 0756  BP:  132/67 135/61   Pulse:  73 76   Temp:  97.5 F (36.4 C)    TempSrc:  Oral    Resp:  18    Height:      Weight:      SpO2: 95% 95%  97%   I&O's:   Intake/Output Summary (Last 24 hours) at 04/25/14 9485 Last data filed at 04/25/14 0600  Gross per 24 hour  Intake    480 ml  Output   2500 ml  Net  -2020 ml   TELEMETRY: Reviewed telemetry pt in NSR:14 beat run of atrial tachycardia     PHYSICAL EXAM General: Well developed, well nourished, in no acute distress Head: Eyes PERRLA, No xanthomas.   Normal cephalic and atramatic  Lungs:   Clear bilaterally to auscultation and percussion. Heart:   HRRR S1 S2 Pulses are 2+ & equal. Abdomen: Bowel sounds are positive, abdomen soft and non-tender without masses Extremities:   No clubbing, cyanosis or edema.  DP +1 Neuro: Alert and oriented X 3. Psych:  Good affect, responds appropriately   LABS: Basic Metabolic Panel:  Recent Labs  04/24/14 0404 04/25/14 0608  NA 136* 140  K 3.4* 3.4*  CL 95* 99  CO2 20 21  GLUCOSE 189* 125*  BUN 76* 73*  CREATININE 5.60* 5.55*  CALCIUM 10.1 9.8  PHOS 6.3* 5.9*   Liver Function Tests:  Recent Labs  04/24/14 0404 04/25/14 0608  ALBUMIN 2.6* 2.7*   No results found for this basename: LIPASE, AMYLASE,  in the last 72 hours CBC:  Recent Labs  04/24/14 0404 04/25/14 0608  WBC 10.8* 11.5*  HGB 10.1* 10.2*  HCT 30.8* 31.0*  MCV 90.9 91.4  PLT 313 291   Cardiac Enzymes:  Recent Labs  04/22/14 1042  TROPONINI 1.59*   BNP: No components found with this basename: POCBNP,  D-Dimer: No results found for this basename: DDIMER,  in the last 72 hours Hemoglobin A1C: No results found for this basename: HGBA1C,  in the last 72 hours Fasting Lipid Panel: No results found for this basename: CHOL, HDL, LDLCALC, TRIG, CHOLHDL,  LDLDIRECT,  in the last 72 hours Thyroid Function Tests: No results found for this basename: TSH, T4TOTAL, FREET3, T3FREE, THYROIDAB,  in the last 72 hours Anemia Panel: No results found for this basename: VITAMINB12, FOLATE, FERRITIN, TIBC, IRON, RETICCTPCT,  in the last 72 hours Coag Panel:   Lab Results  Component Value Date   INR 1.15 04/21/2014    RADIOLOGY: Dg Chest 2 View  04/21/2014   CLINICAL DATA:  Sharp left chest pain, cough, COPD  EXAM: CHEST  2 VIEW  COMPARISON:  02/02/2014  FINDINGS: Chronic interstitial markings/emphysematous changes. No focal consolidation. No pleural effusion or pneumothorax.  The heart is normal in size. Postsurgical changes related to prior CABG.  Degenerative changes of the visualized thoracolumbar spine.  IMPRESSION: No evidence of acute cardiopulmonary disease.   Electronically Signed   By: Julian Hy M.D.   On: 04/21/2014 02:20   Dg Chest Port 1 View  04/24/2014   CLINICAL DATA:  Status post placement dialysis catheter.  EXAM: PORTABLE CHEST - 1 VIEW  COMPARISON:  PA and lateral chest 04/21/2014.  FINDINGS: Right IJ approach central venous catheter is in place with the tips in the mid superior vena cava.  There is no pneumothorax. The lungs appear emphysematous. The patient is status post CABG. Heart size is upper normal.  IMPRESSION: Right IJ catheter tip projects over the mid superior vena cava. Negative for pneumothorax.  No acute cardiopulmonary disease.  Emphysema.   Electronically Signed   By: Inge Rise M.D.   On: 04/24/2014 16:57   Assessment:   1. NSTEMI. Initial troponin I 3.9. Troponin now trending downward. Currently chest pain-free on heparin.  2. CAD status post prior CABG. LVEF 65-70% in 2013.  3. Moderate to severe aortic stenosis by followup echocardiogram (see above report).  4. CKD, stage 5. Creatinine 5.0-5.6. Patient follows with Dr. Moshe Cipro. He has not required hemodialysis so far. Appreciate followup nephrology  consultation.  5. Hyperlipidemia, on statin therapy.  6. Type 2 diabetes mellitus.  7. Hypertension.  8. COPD.  9. ? UTI with large leuk est positive, many bacteria and 11-20 WBC's. Culture read out >100,000 colonies of mixed bacteria ? Contaminant. Will repeat urine culture.  10.  Nonsustained atrial tachycardia. Continue BB for now. Plan/Discussion:   Complex situation. Appreciate input from Nephrology. Patient has spoken to Dr. Tamala Julian and wants to take an aggressive approach to treatment. He wants to proceed with cath knowing the he will need dialysis afterwards. He understands that the procedure is high risk and may need CABG/TAVR. For Cath today. Temporary HD catheter placed yesterday.        Sueanne Margarita, MD  04/25/2014  9:06 AM

## 2014-04-25 NOTE — CV Procedure (Signed)
    Cardiac Catheterization Procedure Note  Name: Raymond [name] MRN: [id number] DOB: [birth year]  Procedure: Right Heart Cath, Left Heart Cath, Selective Coronary Angiography, SVG and LIMA angiography  Indication: 80 yo WM with history of remote CABG in 1997 presents with a NSTEMI. Echo shows evidence of moderate aortic stenosis.    Procedural Details: The right groin was prepped, draped, and anesthetized with 1% lidocaine. Using the modified Seldinger technique a 5 French sheath was placed in the right femoral artery and a 7 French sheath was placed in the right femoral vein. A Swan-Ganz catheter was used for the right heart catheterization. Standard protocol was followed for recording of right heart pressures and sampling of oxygen saturations. Fick cardiac output was calculated. Standard Judkins catheters were used for selective coronary angiography and left ventricular pressures. There were no immediate procedural complications. The patient was transferred to the post catheterization recovery area for further monitoring.  Procedural Findings: Hemodynamics RA 6/6 mean 2 mm Hg RV 28/1 mm Hg PA 31/10 mean 17 mm Hg PCWP 8/7 mean 6 mm Hg LV 152/6 mm Hg AO 136/61 mean 86 mm Hg  AV mean gradient 15 mm Hg  Oxygen saturations: PA 53% AO 93%  Cardiac Output (Fick) 4.19 L/min  Cardiac Index (Fick) 2.39 L/min meter squared   Coronary angiography: Coronary dominance: right  Left mainstem: Distal 50-70% stenosis.  Left anterior descending (LAD): 100% occluded proximally.  Ramus intermediate: small and diffusely disease to 60%  Left circumflex (LCx): 100% occlusion proximally.   Right coronary artery (RCA): This is a large dominant vessel. It is heavily calcified. There is an eccentric 70% stenosis at the ostium. The mid vessel has a complex 90% stenosis.  SVG to the diagonal is widely patent  SVG to the intermediate is occluded.  SVG to the OM is widely patent.  LIMA to the  LAD is patent. This was difficult to engage but flush shots were adequate.  Left ventriculography: Not done.  Final Conclusions:   1. Severe 3 vessel obstructive CAD 2. Patent LIMA to the LAD 3. Patent SVG to the diagonal 4. Patent SVG to OM 5. Occluded SVG to the ramus intermediate. 6. Normal right heart and LV filling pressures. 7. Moderate aortic stenosis.  Recommendations: Will reivew films with Dr. Tamala Julian. Consider complex PCI of the RCA which was not previously grafted versus continued medical therapy.   Collier Salina [facility] 04/25/2014, 12:38 PM

## 2014-04-25 NOTE — Progress Notes (Signed)
ANTICOAGULATION CONSULT NOTE - Follow Up Consult  Pharmacy Consult for heparin Indication: chest pain/ACS s/p cath  Allergies  Allergen Reactions  . Codeine Nausea And Vomiting  . Dilaudid [Hydromorphone Hcl] Nausea And Vomiting    "I get deathly sick"; diaphoresis    Patient Measurements: Height: 5\' 6"  (167.6 cm) Weight: 144 lb 13.5 oz (65.7 kg) IBW/kg (Calculated) : 63.8  Vital Signs: Temp: 97.2 F (36.2 C) (06/17 1402) Temp src: Oral (06/17 1402) BP: 129/64 mmHg (06/17 1418) Pulse Rate: 83 (06/17 1402)  Labs:  Recent Labs  04/23/14 0414  04/23/14 2026 04/24/14 0404 04/25/14 0608  HGB 10.0*  --   --  10.1* 10.2*  HCT 30.5*  --   --  30.8* 31.0*  PLT 320  --   --  313 291  HEPARINUNFRC <0.10*  < > 0.40 0.56 0.41  CREATININE 5.29*  --   --  5.60* 5.55*  < > = values in this interval not displayed.  Estimated Creatinine Clearance: 9.3 ml/min (by C-G formula based on Cr of 5.55).   Medications:  Scheduled:  . aspirin EC  81 mg Oral Daily  . atorvastatin  80 mg Oral q1800  . docusate sodium  100 mg Oral Daily  . doxazosin  4 mg Oral Daily  . furosemide  80 mg Oral BID  . insulin aspart  2 Units Subcutaneous QAC supper  . insulin aspart  4 Units Subcutaneous QAC breakfast  . insulin NPH Human  22 Units Subcutaneous QAC breakfast  . isosorbide mononitrate  60 mg Oral Daily  . lipase/protease/amylase  2 capsule Oral TID WC  . metoprolol  75 mg Oral BID  . mometasone-formoterol  2 puff Inhalation BID  . multivitamin with minerals  1 tablet Oral Daily  . pantoprazole  80 mg Oral Daily  . paricalcitol  1 mcg Oral QODAY  . sevelamer carbonate  800 mg Oral TID WC  . tiotropium  18 mcg Inhalation Daily   Infusions:  . sodium chloride 1 mL/kg/hr (04/25/14 1351)    Assessment: 80 yo male with NSTEMI on heparin now s/p cath with severe 3 vessel obstructive CAD and for consideration of PCI to RCA. Patient to restart heparin 8 hours post sheath removal (removed at  12:51 pm today). Last heparin rate was 950 units/hr and HL at goal (HL= 0.41).    Goal of Therapy:  Heparin level 0.3-0.7 units/ml Monitor platelets by anticoagulation protocol: Yes   Plan:  -Restart heparin at 950 units/hr at 9pm tonight -Heparin level and CBC daily  Hildred Laser, Pharm D 04/25/2014 2:47 PM

## 2014-04-25 NOTE — Progress Notes (Signed)
Assessment:  80 yo WM with a background of CAD, COPD, advanced CKD (followed by Dr. Moshe Cipro and has wanted to do PD as his outpt option when reaching ESRD) who is admitted with NSTEMI, and we were asked to see  Stage 5 CKD with high risk for ESRD short term with or without heart cath - pt has wanted to do PD as his dialysis option, which is why he has no permanent access.  He has spoken with me, Dr. Moshe Cipro (by phone) Dr. Radford Pax, and Dr. Tamala Julian - wants to proceed with aggressive approach, including cath and surgery as well as dialysis if needed. He now has a temporary access in place should he need immediately post angiography and as the picture evolves can be switched out for tunnelled HD cath if need be.  No renal  labs back yet. Cath scheduled for 11:30.  Weight and volume status stable at this time     NSTEMI - per cards Mod to severe AS - per cards DM HTN HLD COPD Secondary hyperpara - on hectorol; added renvela for hyperphos  Subjective: Interval History:  Had right IJ temp HD cath placed yesterday Cath postponed to 11:30 AM today Pt ready to move forward with cath and whatever comes after No pain or SOB thisAM  Objective: Vital signs in last 24 hours: Temp:  [97.4 F (36.3 C)-98.1 F (36.7 C)] 97.5 F (36.4 C) (06/17 0455) Pulse Rate:  [73-87] 73 (06/17 0455) Resp:  [18] 18 (06/17 0455) BP: (115-132)/(57-73) 132/67 mmHg (06/17 0455) SpO2:  [94 %-97 %] 95 % (06/17 0455) Weight:  [65.7 kg (144 lb 13.5 oz)] 65.7 kg (144 lb 13.5 oz) (06/16 1417) Weight change:   Intake/Output from previous day: 06/16 0701 - 06/17 0700 In: 720 [P.O.:720] Out: 2500 [Urine:2500] Intake/Output this shift: Total I/O In: -  Out: 1650 [Urine:1650]  WeightTrending: 04/24/14 1417 65.7 kg (144 lb 13.5 oz) 04/23/14 0500 65.7 kg (144 lb 13.5 oz) 04/22/14 0539 65.1 kg (143 lb 8.3   BP 132/67  Pulse 73  Temp(Src) 97.5 F (36.4 C) (Oral)  Resp 18  Ht 5\' 6"  (1.676 m)  Wt 65.7 kg (144  lb 13.5 oz)  BMI 23.39 kg/m2  SpO2 95% Awake, alert, very matter of fact and comfortable with plans NAD Resp: No crackles at this time Chest wall: old sternotomy scar Cardio: regular rate and rhythm, S1, S2 normal  Systolic murmur aortic area and at apex Extremities: extremities normal, atraumatic, no cyanosis or edema of the lower extremities NO LABS BACK TODAY YET  Recent Labs  04/23/14 0414 04/24/14 0404  WBC 11.8* 10.8*  HGB 10.0* 10.1*  HCT 30.5* 30.8*  PLT 320 313    Recent Labs  04/23/14 0414 04/24/14 0404  NA 137 136*  K 3.1* 3.4*  CL 97 95*  CO2 20 20  GLUCOSE 60* 189*  BUN 74* 76*  CREATININE 5.29* 5.60*  CALCIUM 9.5 10.1   Studies/Results: Dg Chest Port 1 View  04/24/2014   CLINICAL DATA:  Status post placement dialysis catheter.  EXAM: PORTABLE CHEST - 1 VIEW  COMPARISON:  PA and lateral chest 04/21/2014.  FINDINGS: Right IJ approach central venous catheter is in place with the tips in the mid superior vena cava. There is no pneumothorax. The lungs appear emphysematous. The patient is status post CABG. Heart size is upper normal.  IMPRESSION: Right IJ catheter tip projects over the mid superior vena cava. Negative for pneumothorax.  No acute cardiopulmonary disease.  Emphysema.  Electronically Signed   By: Inge Rise M.D.   On: 04/24/2014 16:57   Scheduled: . aspirin  81 mg Oral Pre-Cath  . aspirin EC  81 mg Oral Daily  . atorvastatin  80 mg Oral q1800  . docusate sodium  100 mg Oral Daily  . doxazosin  4 mg Oral Daily  . furosemide  80 mg Oral BID  . heparin  3,000 Units Intravenous Once  . insulin aspart  2 Units Subcutaneous QAC supper  . insulin aspart  4 Units Subcutaneous QAC breakfast  . insulin NPH Human  22 Units Subcutaneous QAC breakfast  . isosorbide mononitrate  60 mg Oral Daily  . lipase/protease/amylase  2 capsule Oral TID WC  . metoprolol  75 mg Oral BID  . mometasone-formoterol  2 puff Inhalation BID  . multivitamin with  minerals  1 tablet Oral Daily  . pantoprazole  80 mg Oral Daily  . paricalcitol  1 mcg Oral QODAY  . sevelamer carbonate  800 mg Oral TID WC  . tiotropium  18 mcg Inhalation Daily   . sodium chloride    . heparin 950 Units/hr (04/24/14 1429)     LOS: 4 days   DUNHAM,CYNTHIA B 04/25/2014,6:48 AM

## 2014-04-25 NOTE — Progress Notes (Signed)
ANTICOAGULATION CONSULT NOTE - Follow Up Consult  Pharmacy Consult for heparin Indication: chest pain/ACS  Allergies  Allergen Reactions  . Codeine Nausea And Vomiting  . Dilaudid [Hydromorphone Hcl] Nausea And Vomiting    "I get deathly sick"; diaphoresis    Patient Measurements: Height: 5\' 6"  (167.6 cm) Weight: 144 lb 13.5 oz (65.7 kg) IBW/kg (Calculated) : 63.8  Vital Signs: Temp: 97.5 F (36.4 C) (06/17 0455) Temp src: Oral (06/17 0455) BP: 135/61 mmHg (06/17 0722) Pulse Rate: 76 (06/17 0722)  Labs:  Recent Labs  04/22/14 1042  04/23/14 0414  04/23/14 2026 04/24/14 0404 04/25/14 0608  HGB  --   < > 10.0*  --   --  10.1* 10.2*  HCT  --   --  30.5*  --   --  30.8* 31.0*  PLT  --   --  320  --   --  313 291  HEPARINUNFRC  --   --  <0.10*  < > 0.40 0.56 0.41  CREATININE  --   --  5.29*  --   --  5.60* 5.55*  TROPONINI 1.59*  --   --   --   --   --   --   < > = values in this interval not displayed.  Estimated Creatinine Clearance: 9.3 ml/min (by C-G formula based on Cr of 5.55).   Medications:  Scheduled:  . aspirin  81 mg Oral Pre-Cath  . aspirin EC  81 mg Oral Daily  . atorvastatin  80 mg Oral q1800  . docusate sodium  100 mg Oral Daily  . doxazosin  4 mg Oral Daily  . furosemide  80 mg Oral BID  . heparin  3,000 Units Intravenous Once  . insulin aspart  2 Units Subcutaneous QAC supper  . insulin aspart  4 Units Subcutaneous QAC breakfast  . insulin NPH Human  22 Units Subcutaneous QAC breakfast  . isosorbide mononitrate  60 mg Oral Daily  . lipase/protease/amylase  2 capsule Oral TID WC  . metoprolol  75 mg Oral BID  . mometasone-formoterol  2 puff Inhalation BID  . multivitamin with minerals  1 tablet Oral Daily  . pantoprazole  80 mg Oral Daily  . paricalcitol  1 mcg Oral QODAY  . sevelamer carbonate  800 mg Oral TID WC  . tiotropium  18 mcg Inhalation Daily   Infusions:  . sodium chloride    . heparin 950 Units/hr (04/24/14 1429)     Assessment: 80 yo male with NSTEMI on heparin and at goal on 950 units/hr (HL= 0.41). Patient noted for cath today.  Goal of Therapy:  Heparin level 0.3-0.7 units/ml Monitor platelets by anticoagulation protocol: Yes   Plan:  -No heparin changes needed -Daily CBC and heparin level  -Will follow post cath  Hildred Laser, Pharm D 04/25/2014 10:08 AM

## 2014-04-26 DIAGNOSIS — I350 Nonrheumatic aortic (valve) stenosis: Secondary | ICD-10-CM | POA: Diagnosis present

## 2014-04-26 DIAGNOSIS — I2584 Coronary atherosclerosis due to calcified coronary lesion: Secondary | ICD-10-CM

## 2014-04-26 LAB — GLUCOSE, CAPILLARY
GLUCOSE-CAPILLARY: 162 mg/dL — AB (ref 70–99)
GLUCOSE-CAPILLARY: 341 mg/dL — AB (ref 70–99)
Glucose-Capillary: 108 mg/dL — ABNORMAL HIGH (ref 70–99)
Glucose-Capillary: 231 mg/dL — ABNORMAL HIGH (ref 70–99)

## 2014-04-26 LAB — RENAL FUNCTION PANEL
Albumin: 2.7 g/dL — ABNORMAL LOW (ref 3.5–5.2)
BUN: 72 mg/dL — ABNORMAL HIGH (ref 6–23)
CO2: 21 meq/L (ref 19–32)
CREATININE: 5.72 mg/dL — AB (ref 0.50–1.35)
Calcium: 9.6 mg/dL (ref 8.4–10.5)
Chloride: 97 mEq/L (ref 96–112)
GFR calc non Af Amer: 8 mL/min — ABNORMAL LOW (ref >90)
GFR, EST AFRICAN AMERICAN: 10 mL/min — AB (ref >90)
GLUCOSE: 208 mg/dL — AB (ref 70–99)
PHOSPHORUS: 5.1 mg/dL — AB (ref 2.3–4.6)
Potassium: 3.5 mEq/L — ABNORMAL LOW (ref 3.7–5.3)
SODIUM: 136 meq/L — AB (ref 137–147)

## 2014-04-26 LAB — CBC
HEMATOCRIT: 30.5 % — AB (ref 39.0–52.0)
Hemoglobin: 9.8 g/dL — ABNORMAL LOW (ref 13.0–17.0)
MCH: 29.4 pg (ref 26.0–34.0)
MCHC: 32.1 g/dL (ref 30.0–36.0)
MCV: 91.6 fL (ref 78.0–100.0)
Platelets: 293 10*3/uL (ref 150–400)
RBC: 3.33 MIL/uL — AB (ref 4.22–5.81)
RDW: 15.9 % — ABNORMAL HIGH (ref 11.5–15.5)
WBC: 11.7 10*3/uL — ABNORMAL HIGH (ref 4.0–10.5)

## 2014-04-26 LAB — HEPARIN LEVEL (UNFRACTIONATED): Heparin Unfractionated: 0.29 IU/mL — ABNORMAL LOW (ref 0.30–0.70)

## 2014-04-26 MED ORDER — INSULIN ASPART 100 UNIT/ML ~~LOC~~ SOLN
0.0000 [IU] | Freq: Four times a day (QID) | SUBCUTANEOUS | Status: DC
  Administered 2014-04-26: 7 [IU] via SUBCUTANEOUS
  Administered 2014-04-27: 2 [IU] via SUBCUTANEOUS
  Administered 2014-04-27 – 2014-04-29 (×2): 1 [IU] via SUBCUTANEOUS
  Administered 2014-04-29 – 2014-04-30 (×2): 2 [IU] via SUBCUTANEOUS

## 2014-04-26 MED ORDER — CLOPIDOGREL BISULFATE 300 MG PO TABS
300.0000 mg | ORAL_TABLET | Freq: Once | ORAL | Status: DC
  Filled 2014-04-26: qty 1

## 2014-04-26 MED ORDER — INSULIN ASPART 100 UNIT/ML ~~LOC~~ SOLN: 0.0000 [IU] | Freq: Three times a day (TID) | SUBCUTANEOUS | Status: DC

## 2014-04-26 MED ORDER — SODIUM CHLORIDE 0.9 % IV SOLN
1.0000 mL/kg/h | INTRAVENOUS | Status: DC
  Administered 2014-04-26 – 2014-04-27 (×2): 1 mL/kg/h via INTRAVENOUS

## 2014-04-26 MED ORDER — CLOPIDOGREL BISULFATE 75 MG PO TABS
75.0000 mg | ORAL_TABLET | Freq: Every day | ORAL | Status: DC
  Administered 2014-04-27 – 2014-04-30 (×4): 75 mg via ORAL
  Filled 2014-04-26 (×6): qty 1

## 2014-04-26 MED ORDER — ASPIRIN 81 MG PO CHEW
81.0000 mg | CHEWABLE_TABLET | ORAL | Status: AC
  Administered 2014-04-27: 81 mg via ORAL
  Filled 2014-04-26: qty 1

## 2014-04-26 MED ORDER — SODIUM CHLORIDE 0.9 % IJ SOLN: 3.0000 mL | INTRAMUSCULAR | Status: DC | PRN

## 2014-04-26 MED ORDER — SODIUM CHLORIDE 0.9 % IV SOLN: 250.0000 mL | INTRAVENOUS | Status: DC | PRN

## 2014-04-26 MED ORDER — SODIUM CHLORIDE 0.9 % IJ SOLN: 3.0000 mL | Freq: Two times a day (BID) | INTRAMUSCULAR | Status: DC

## 2014-04-26 NOTE — Progress Notes (Signed)
Assessment:  80 yo WM with a background of CAD, COPD, advanced CKD 5 (creatinine in the 5's) (followed by Dr. Moshe Cipro and has wanted to do PD as his outpt option when reaching ESRD) who is admitted with NSTEMI, and we were asked to see   Stage 5 CKD with high risk for ESRD - pt has wanted to do PD as his dialysis option, which is why he has no permanent access.  He talked with me, Dr. Moshe Cipro (by phone) Dr. Radford Pax, and Dr. Tamala Julian - decided to proceed with aggressive approach; has had cath and to have possible PCI of RCA tomorrrow.  He now has a temporary access in place should he need urgently. As the picture evolves can be switched out for tunnelled HD cath if need be.  Weight and volume status stable at this time, he is not SOB, creatinine stable (though GFR quite low) and not in pulmonary edema; continue current diuretic    NSTEMI - s/p cath; considering complex PCI of RCA; filling pressures were normal Mod to severe AS - not sure of plan here DM HTN HLD COPD Secondary hyperpara - on hectorol; added renvela for hyperphos Anemia - Hb just under 10 now; check iron studies; if consistently low add ESA  Subjective: Interval History:  Had right IJ temp HD cath placed 6/16 Cath 6/17 results noted No pain or SOB reported this AM but got winded at the scales  Objective: Vital signs in last 24 hours: Temp:  [97.2 F (36.2 C)-98.2 F (36.8 C)] 97.7 F (36.5 C) (06/18 0639) Pulse Rate:  [75-86] 75 (06/18 0639) Resp:  [17-18] 18 (06/18 0639) BP: (101-153)/(61-83) 138/70 mmHg (06/18 0639) SpO2:  [94 %-100 %] 100 % (06/18 0639) Weight:  [63.2 kg (139 lb 5.3 oz)] 63.2 kg (139 lb 5.3 oz) (06/18 0639) Weight change: -2.5 kg (-5 lb 8.2 oz)  Intake/Output from previous day: 06/17 0701 - 06/18 0700 In: 314.7 [P.O.:240; I.V.:74.7] Out: 1100 [Urine:1100] Intake/Output this shift: Total I/O In: 74.7 [I.V.:74.7] Out: 750 [Urine:750]  WeightTrending: 04/26/14 0639 63.2 kg  04/24/14 1417  65.7 kg (144 lb 13.5 oz) 04/23/14 0500 65.7 kg (144 lb 13.5 oz) 04/22/14 0539 65.1 kg (143 lb 8.3  BP 138/70  Pulse 75  Temp(Src) 97.7 F (36.5 C) (Oral)  Resp 18  Ht 5\' 6"  (1.676 m)  Wt 63.2 kg (139 lb 5.3 oz)  BMI 22.50 kg/m2  SpO2 100% Awake, alert, very matter of fact and comfortable with plans NAD IJ temp HD cath on the right Resp: No crackles at this time Chest wall: old sternotomy scar Cardio: regular rate and rhythm, S1, S2 normal  Systolic murmur aortic area and at apex Extremities: extremities normal, atraumatic, no cyanosis or edema of the lower extremities   Recent Labs  04/25/14 0608 04/26/14 0345  WBC 11.5* 11.7*  HGB 10.2* 9.8*  HCT 31.0* 30.5*  PLT 291 293    Recent Labs  04/25/14 0608 04/26/14 0345  NA 140 136*  K 3.4* 3.5*  CL 99 97  CO2 21 21  GLUCOSE 125* 208*  BUN 73* 72*  CREATININE 5.55* 5.72*  CALCIUM 9.8 9.6   Results for JAXSTON, CHOHAN (MRN [id number]) as of 04/26/2014 06:55  04/26/2014 03:45  Phosphorus 5.1 (H)   Studies/Results: Dg Chest Port 1 View  04/24/2014   CLINICAL DATA:  Status post placement dialysis catheter.  EXAM: PORTABLE CHEST - 1 VIEW  COMPARISON:  PA and lateral chest 04/21/2014.  FINDINGS: Right IJ  approach central venous catheter is in place with the tips in the mid superior vena cava. There is no pneumothorax. The lungs appear emphysematous. The patient is status post CABG. Heart size is upper normal.  IMPRESSION: Right IJ catheter tip projects over the mid superior vena cava. Negative for pneumothorax.  No acute cardiopulmonary disease.  Emphysema.   Electronically Signed   By: Inge Rise M.D.   On: 04/24/2014 16:57   Scheduled: . aspirin EC  81 mg Oral Daily  . atorvastatin  80 mg Oral q1800  . docusate sodium  100 mg Oral Daily  . doxazosin  4 mg Oral Daily  . furosemide  80 mg Oral BID  . insulin aspart  2 Units Subcutaneous QAC supper  . insulin aspart  4 Units Subcutaneous QAC breakfast  .  insulin NPH Human  22 Units Subcutaneous QAC breakfast  . isosorbide mononitrate  60 mg Oral Daily  . lipase/protease/amylase  2 capsule Oral TID WC  . metoprolol  75 mg Oral BID  . mometasone-formoterol  2 puff Inhalation BID  . multivitamin with minerals  1 tablet Oral Daily  . pantoprazole  80 mg Oral Daily  . paricalcitol  1 mcg Oral QODAY  . sevelamer carbonate  800 mg Oral TID WC  . tiotropium  18 mcg Inhalation Daily   . heparin 1,000 Units/hr (04/26/14 0514)     LOS: 5 days   DUNHAM,CYNTHIA B 04/26/2014,6:51 AM

## 2014-04-26 NOTE — Progress Notes (Signed)
SUBJECTIVE:  No complaints  OBJECTIVE:   Vitals:   Filed Vitals:   04/25/14 1446 04/25/14 2119 04/25/14 2125 04/26/14 0639  BP: 153/83  109/72 138/70  Pulse:   80 75  Temp:   98.2 F (36.8 C) 97.7 F (36.5 C)  TempSrc:   Oral Oral  Resp:   18 18  Height:      Weight:    139 lb 5.3 oz (63.2 kg)  SpO2:  94% 97% 100%   I&O's:   Intake/Output Summary (Last 24 hours) at 04/26/14 0724 Last data filed at 04/26/14 0600  Gross per 24 hour  Intake 314.73 ml  Output   1100 ml  Net -785.27 ml   TELEMETRY: Reviewed telemetry pt in NSR:     PHYSICAL EXAM General: Well developed, well nourished, in no acute distress Head: Eyes PERRLA, No xanthomas.   Normal cephalic and atramatic  Lungs:   Clear bilaterally to auscultation and percussion. Heart:   HRRR S1 S2 Pulses are 2+ & equal. Abdomen: Bowel sounds are positive, abdomen soft and non-tender without masses Extremities:   No clubbing, cyanosis or edema.  DP +1 Neuro: Alert and oriented X 3. Psych:  Good affect, responds appropriately   LABS: Basic Metabolic Panel:  Recent Labs  04/25/14 0608 04/26/14 0345  NA 140 136*  K 3.4* 3.5*  CL 99 97  CO2 21 21  GLUCOSE 125* 208*  BUN 73* 72*  CREATININE 5.55* 5.72*  CALCIUM 9.8 9.6  PHOS 5.9* 5.1*   Liver Function Tests:  Recent Labs  04/25/14 0608 04/26/14 0345  ALBUMIN 2.7* 2.7*   No results found for this basename: LIPASE, AMYLASE,  in the last 72 hours CBC:  Recent Labs  04/25/14 0608 04/26/14 0345  WBC 11.5* 11.7*  HGB 10.2* 9.8*  HCT 31.0* 30.5*  MCV 91.4 91.6  PLT 291 293   Cardiac Enzymes: No results found for this basename: CKTOTAL, CKMB, CKMBINDEX, TROPONINI,  in the last 72 hours BNP: No components found with this basename: POCBNP,  D-Dimer: No results found for this basename: DDIMER,  in the last 72 hours Hemoglobin A1C: No results found for this basename: HGBA1C,  in the last 72 hours Fasting Lipid Panel: No results found for this  basename: CHOL, HDL, LDLCALC, TRIG, CHOLHDL, LDLDIRECT,  in the last 72 hours Thyroid Function Tests: No results found for this basename: TSH, T4TOTAL, FREET3, T3FREE, THYROIDAB,  in the last 72 hours Anemia Panel: No results found for this basename: VITAMINB12, FOLATE, FERRITIN, TIBC, IRON, RETICCTPCT,  in the last 72 hours Coag Panel:   Lab Results  Component Value Date   INR 1.15 04/21/2014    RADIOLOGY: Dg Chest 2 View  04/21/2014   CLINICAL DATA:  Sharp left chest pain, cough, COPD  EXAM: CHEST  2 VIEW  COMPARISON:  02/02/2014  FINDINGS: Chronic interstitial markings/emphysematous changes. No focal consolidation. No pleural effusion or pneumothorax.  The heart is normal in size. Postsurgical changes related to prior CABG.  Degenerative changes of the visualized thoracolumbar spine.  IMPRESSION: No evidence of acute cardiopulmonary disease.   Electronically Signed   By: Julian Hy M.D.   On: 04/21/2014 02:20   Dg Chest Port 1 View  04/24/2014   CLINICAL DATA:  Status post placement dialysis catheter.  EXAM: PORTABLE CHEST - 1 VIEW  COMPARISON:  PA and lateral chest 04/21/2014.  FINDINGS: Right IJ approach central venous catheter is in place with the tips in the mid superior vena cava.  There is no pneumothorax. The lungs appear emphysematous. The patient is status post CABG. Heart size is upper normal.  IMPRESSION: Right IJ catheter tip projects over the mid superior vena cava. Negative for pneumothorax.  No acute cardiopulmonary disease.  Emphysema.   Electronically Signed   By: Inge Rise M.D.   On: 04/24/2014 16:57   Assessment/Plan:   1. NSTEMI. Initial troponin I 3.9. Troponin now trending downward. Currently chest pain-free on heparin. Cath yesterday showed severe 3 vessel ASCAD with patent LIMA to LAD, patent SVG to diag and SVG to OM, occluded SVG to ramus, moderate AS and normal right heart and LV filling pressures. He has a 70% stenosis in the ostium of an ungrafted RCA and  90% mid RCA.  Plan is for possible  complex PCI of the RCA after Dr. Tamala Julian reviews films.  This will be Friday if done.  Continue ASA/statin/BB/IV Heparin/Imdur 2. CAD status post prior CABG. LVEF 65-70% in 2013.  3. Moderate aortic stenosis by cath. 4. CKD, stage 5. Creatinine 5.72 post cath. Appreciate followup nephrology consultation.  5. Hyperlipidemia, on statin therapy.  6. Type 2 diabetes mellitus.  7. Hypertension - well controlled 8. COPD.  9. ? UTI with large leuk est positive, many bacteria and 11-20 WBC's. Culture read out >100,000 colonies of mixed bacteria ? Contaminant. Repeat urine culture show same but with no predominant species. 10. Nonsustained atrial tachycardia. Continue BB for now.    Raymond Margarita, MD  04/26/2014  7:24 AM

## 2014-04-26 NOTE — Progress Notes (Signed)
Pt c/o feeling as if foley cath was stopped up, "not draining". Current 300cc in bag, pt has a chronic foley and states this has occurred before. Call placed to PA on call, order to irrigate foley was placed, before irrigation could be done pt took cath tubing apart from bag tubing and "milked" the end of the cath tubing then reconnected and urine flowed freely. Therefore irrigation was not done at this time.

## 2014-04-26 NOTE — Progress Notes (Signed)
Interventional Note  The Coronary angio from yesterday is reviewed. He has severe ostial and mild RCA disease. Risk is increased due to calcification and co-morbidities. He will have an estimated 80-85% success rate. The risk of ischemic complications will be 01-00%. He will not be a candidate for emergency CABG if we fail. Contrast load will be impossible to minimize.   The increased risk of stroke, death, MI, bleeding and kidney failure discussed in simple  language.  He was made aware of these facts and wife was present during the conversation. He has consented to proceed.  Procedure planned for 8:30 A.

## 2014-04-26 NOTE — Progress Notes (Signed)
[place] for heparin Indication: CAD awaiting possible PCI  Allergies  Allergen Reactions  . Codeine Nausea And Vomiting  . Dilaudid [Hydromorphone Hcl] Nausea And Vomiting    "I get deathly sick"; diaphoresis    Patient Measurements: Height: 5\' 6"  (167.6 cm) Weight: 144 lb 13.5 oz (65.7 kg) IBW/kg (Calculated) : 63.8  Vital Signs: Temp: 98.2 F (36.8 C) (06/17 2125) Temp src: Oral (06/17 2125) BP: 109/72 mmHg (06/17 2125) Pulse Rate: 80 (06/17 2125)  Labs:  Recent Labs  04/24/14 0404 04/25/14 0608 04/26/14 0345  HGB 10.1* 10.2* 9.8*  HCT 30.8* 31.0* 30.5*  PLT 313 291 293  HEPARINUNFRC 0.56 0.41 0.29*  CREATININE 5.60* 5.55*  --     Estimated Creatinine Clearance: 9.3 ml/min (by C-G formula based on Cr of 5.55).  Assessment: 80 yo male with NSTEMI s/p cath, awaiting possible PCI, for heparin  Goal of Therapy:  Heparin level 0.3-0.7 units/ml Monitor platelets by anticoagulation protocol: Yes   Plan:  Increase Heparin 1000 units/hr  Phillis Knack, PharmD, BCPS   04/26/2014 4:57 AM

## 2014-04-27 ENCOUNTER — Other Ambulatory Visit: Payer: Self-pay | Admitting: Interventional Cardiology

## 2014-04-27 ENCOUNTER — Encounter (HOSPITAL_COMMUNITY)
Admission: EM | Disposition: A | Discharge status: Home / Self Care | Payer: Managed Care, Other (non HMO) | Source: Home / Self Care | Attending: Interventional Cardiology

## 2014-04-27 DIAGNOSIS — I2510 Atherosclerotic heart disease of native coronary artery without angina pectoris: Secondary | ICD-10-CM

## 2014-04-27 HISTORY — PX: PERCUTANEOUS CORONARY ROTOBLATOR INTERVENTION (PCI-R): SHX5484

## 2014-04-27 LAB — CBC
HEMATOCRIT: 31.1 % — AB (ref 39.0–52.0)
HEMOGLOBIN: 10 g/dL — AB (ref 13.0–17.0)
MCH: 29.4 pg (ref 26.0–34.0)
MCHC: 32.2 g/dL (ref 30.0–36.0)
MCV: 91.5 fL (ref 78.0–100.0)
Platelets: 288 10*3/uL (ref 150–400)
RBC: 3.4 MIL/uL — AB (ref 4.22–5.81)
RDW: 15.6 % — ABNORMAL HIGH (ref 11.5–15.5)
WBC: 12.2 10*3/uL — ABNORMAL HIGH (ref 4.0–10.5)

## 2014-04-27 LAB — GLUCOSE, CAPILLARY
GLUCOSE-CAPILLARY: 80 mg/dL (ref 70–99)
GLUCOSE-CAPILLARY: 133 mg/dL — AB (ref 70–99)
GLUCOSE-CAPILLARY: 154 mg/dL — AB (ref 70–99)
Glucose-Capillary: 149 mg/dL — ABNORMAL HIGH (ref 70–99)
Glucose-Capillary: 107 mg/dL — ABNORMAL HIGH (ref 70–99)

## 2014-04-27 LAB — POCT ACTIVATED CLOTTING TIME: Activated Clotting Time: 292 seconds

## 2014-04-27 LAB — RENAL FUNCTION PANEL
Albumin: 2.8 g/dL — ABNORMAL LOW (ref 3.5–5.2)
BUN: 73 mg/dL — ABNORMAL HIGH (ref 6–23)
CO2: 23 meq/L (ref 19–32)
CREATININE: 5.88 mg/dL — AB (ref 0.50–1.35)
Calcium: 9.7 mg/dL (ref 8.4–10.5)
Chloride: 97 mEq/L (ref 96–112)
GFR calc Af Amer: 9 mL/min — ABNORMAL LOW (ref >90)
GFR calc non Af Amer: 8 mL/min — ABNORMAL LOW (ref >90)
GLUCOSE: 75 mg/dL (ref 70–99)
Phosphorus: 4.2 mg/dL (ref 2.3–4.6)
Potassium: 3.8 mEq/L (ref 3.7–5.3)
Sodium: 136 mEq/L — ABNORMAL LOW (ref 137–147)

## 2014-04-27 LAB — IRON AND TIBC
Iron: 31 ug/dL — ABNORMAL LOW (ref 42–135)
SATURATION RATIOS: 24 % (ref 20–55)
TIBC: 130 ug/dL — AB (ref 215–435)
UIBC: 99 ug/dL — ABNORMAL LOW (ref 125–400)

## 2014-04-27 LAB — FERRITIN: Ferritin: 1365 ng/mL — ABNORMAL HIGH (ref 22–322)

## 2014-04-27 LAB — HEPARIN LEVEL (UNFRACTIONATED): Heparin Unfractionated: 0.56 IU/mL (ref 0.30–0.70)

## 2014-04-27 SURGERY — PERCUTANEOUS CORONARY ROTOBLATOR INTERVENTION (PCI-R)
Anesthesia: LOCAL

## 2014-04-27 MED ORDER — MIDAZOLAM HCL 2 MG/2ML IJ SOLN
INTRAMUSCULAR | Status: AC
  Filled 2014-04-27: qty 2

## 2014-04-27 MED ORDER — NITROGLYCERIN 0.2 MG/ML ON CALL CATH LAB
INTRAVENOUS | Status: AC
  Filled 2014-04-27: qty 1

## 2014-04-27 MED ORDER — FENTANYL CITRATE 0.05 MG/ML IJ SOLN
INTRAMUSCULAR | Status: AC
  Filled 2014-04-27: qty 2

## 2014-04-27 MED ORDER — CLOPIDOGREL BISULFATE 300 MG PO TABS
ORAL_TABLET | ORAL | Status: AC
  Filled 2014-04-27: qty 1

## 2014-04-27 MED ORDER — BIVALIRUDIN 250 MG IV SOLR
INTRAVENOUS | Status: AC
  Filled 2014-04-27: qty 250

## 2014-04-27 MED ORDER — HEPARIN SODIUM (PORCINE) 1000 UNIT/ML IJ SOLN
INTRAMUSCULAR | Status: AC
  Filled 2014-04-27: qty 1

## 2014-04-27 MED ORDER — ASPIRIN 81 MG PO CHEW
81.0000 mg | CHEWABLE_TABLET | Freq: Every day | ORAL | Status: DC
  Administered 2014-04-28 – 2014-04-30 (×3): 81 mg via ORAL
  Filled 2014-04-27 (×3): qty 1

## 2014-04-27 MED ORDER — VERAPAMIL HCL 2.5 MG/ML IV SOLN
INTRAVENOUS | Status: AC
  Filled 2014-04-27: qty 4

## 2014-04-27 MED ORDER — HEPARIN (PORCINE) IN NACL 2-0.9 UNIT/ML-% IJ SOLN
INTRAMUSCULAR | Status: AC
  Filled 2014-04-27: qty 1000

## 2014-04-27 MED ORDER — SODIUM CHLORIDE 0.9 % IV SOLN
INTRAVENOUS | Status: DC
  Administered 2014-04-27 – 2014-04-28 (×2): via INTRAVENOUS

## 2014-04-27 MED ORDER — FAMOTIDINE IN NACL 20-0.9 MG/50ML-% IV SOLN
INTRAVENOUS | Status: AC
  Filled 2014-04-27: qty 50

## 2014-04-27 MED ORDER — LIDOCAINE HCL (PF) 1 % IJ SOLN
INTRAMUSCULAR | Status: AC
  Filled 2014-04-27: qty 30

## 2014-04-27 NOTE — Progress Notes (Signed)
Received a page from from nursing staff, patient has a chronic foley catheter that changes every 30 days. Patient wish to have his foley catheter changed. He was previously followed by Dr. Kathie Rhodes of [facility]. No record of urological follow up available in [facility]. After previous foley catheter was removed, 2 attempts were made to place new foley catheter without success. Urology was consulted for placement of new foley catheter.   Hilbert Corrigan PA Pager: [phone]

## 2014-04-27 NOTE — Progress Notes (Signed)
Inpatient Diabetes Program Recommendations  AACE/ADA: New Consensus Statement on Inpatient Glycemic Control (2013)  Target Ranges:  Prepandial:   less than 140 mg/dL      Peak postprandial:   less than 180 mg/dL (1-2 hours)      Critically ill patients:  140 - 180 mg/dL  Results for SCOTTI, KOSTA (MRN [id number]) as of 04/27/2014 10:14  Ref. Range 04/26/2014 06:33 04/26/2014 11:17 04/26/2014 16:30 04/26/2014 21:00  Glucose-Capillary Latest Range: 70-99 mg/dL 162 (H) 108 (H) 231 (H) 341 (H)   Please consider using Lantus or Levemir and Novolog per Glycemic Control Order-Set during hospitalization.  Current regimen is not meeting target ranges. Recommend:  Lantus or Levemir 15 units Novolog sensitive scale q 4 while NPO (change to TID + HS when diet restarted)  Thank you  Raoul Pitch BSN, RN,CDE Inpatient Diabetes Coordinator [phone] (team pager)

## 2014-04-27 NOTE — Progress Notes (Signed)
SUBJECTIVE:  Denies chest pain or SOB  OBJECTIVE:   Vitals:   Filed Vitals:   04/26/14 1133 04/26/14 1134 04/26/14 1331 04/26/14 2030  BP: 168/71 168/71 118/59 117/65  Pulse:  45 73 79  Temp:   97.7 F (36.5 C) 98 F (36.7 C)  TempSrc:   Oral Oral  Resp:   18 16  Height:      Weight:      SpO2:   97% 96%   I&O's:   Intake/Output Summary (Last 24 hours) at 04/27/14 4098 Last data filed at 04/26/14 1900  Gross per 24 hour  Intake 737.67 ml  Output    600 ml  Net 137.67 ml   TELEMETRY: Reviewed telemetry pt in NSR with frequent PAC's and a short run of NSVT 6 beats:     PHYSICAL EXAM General: Well developed, well nourished, in no acute distress Head: Eyes PERRLA, No xanthomas.   Normal cephalic and atramatic  Lungs:   Clear bilaterally to auscultation and percussion. Heart:   HRRR S1 S2 Pulses are 2+ & equal. 2/6 SM at RUSB to LLSB Abdomen: Bowel sounds are positive, abdomen soft and non-tender without masses Extremities:   No clubbing, cyanosis or edema.  DP +1 Neuro: Alert and oriented X 3. Psych:  Good affect, responds appropriately   LABS: Basic Metabolic Panel:  Recent Labs  04/25/14 0608 04/26/14 0345  NA 140 136*  K 3.4* 3.5*  CL 99 97  CO2 21 21  GLUCOSE 125* 208*  BUN 73* 72*  CREATININE 5.55* 5.72*  CALCIUM 9.8 9.6  PHOS 5.9* 5.1*   Liver Function Tests:  Recent Labs  04/25/14 0608 04/26/14 0345  ALBUMIN 2.7* 2.7*   No results found for this basename: LIPASE, AMYLASE,  in the last 72 hours CBC:  Recent Labs  04/26/14 0345 04/27/14 0512  WBC 11.7* 12.2*  HGB 9.8* 10.0*  HCT 30.5* 31.1*  MCV 91.6 91.5  PLT 293 288   Cardiac Enzymes: No results found for this basename: CKTOTAL, CKMB, CKMBINDEX, TROPONINI,  in the last 72 hours BNP: No components found with this basename: POCBNP,  D-Dimer: No results found for this basename: DDIMER,  in the last 72 hours Hemoglobin A1C: No results found for this basename: HGBA1C,  in the last  72 hours Fasting Lipid Panel: No results found for this basename: CHOL, HDL, LDLCALC, TRIG, CHOLHDL, LDLDIRECT,  in the last 72 hours Thyroid Function Tests: No results found for this basename: TSH, T4TOTAL, FREET3, T3FREE, THYROIDAB,  in the last 72 hours Anemia Panel: No results found for this basename: VITAMINB12, FOLATE, FERRITIN, TIBC, IRON, RETICCTPCT,  in the last 72 hours Coag Panel:   Lab Results  Component Value Date   INR 1.15 04/21/2014    RADIOLOGY: Dg Chest 2 View  04/21/2014   CLINICAL DATA:  Sharp left chest pain, cough, COPD  EXAM: CHEST  2 VIEW  COMPARISON:  02/02/2014  FINDINGS: Chronic interstitial markings/emphysematous changes. No focal consolidation. No pleural effusion or pneumothorax.  The heart is normal in size. Postsurgical changes related to prior CABG.  Degenerative changes of the visualized thoracolumbar spine.  IMPRESSION: No evidence of acute cardiopulmonary disease.   Electronically Signed   By: Julian Hy M.D.   On: 04/21/2014 02:20   Dg Chest Port 1 View  04/24/2014   CLINICAL DATA:  Status post placement dialysis catheter.  EXAM: PORTABLE CHEST - 1 VIEW  COMPARISON:  PA and lateral chest 04/21/2014.  FINDINGS: Right IJ  approach central venous catheter is in place with the tips in the mid superior vena cava. There is no pneumothorax. The lungs appear emphysematous. The patient is status post CABG. Heart size is upper normal.  IMPRESSION: Right IJ catheter tip projects over the mid superior vena cava. Negative for pneumothorax.  No acute cardiopulmonary disease.  Emphysema.   Electronically Signed   By: Inge Rise M.D.   On: 04/24/2014 16:57    Assessment/Plan:   1. NSTEMI. Initial troponin I 3.9. Troponin now trending downward. Currently chest pain-free on heparin. Cath yesterday showed severe 3 vessel ASCAD with patent LIMA to LAD, patent SVG to diag and SVG to OM, occluded SVG to ramus, moderate AS and normal right heart and LV filling pressures.  He has a 70% stenosis in the ostium of an ungrafted RCA and 90% mid RCA. Plan is for possible complex PCI of the RCA today. Continue ASA/statin/BB/IV Heparin/Imdur  2. CAD status post prior CABG. LVEF 65-70% in 2013.  3. Moderate aortic stenosis by cath.  4. CKD, stage 5. Creatinine 5.72 post cath. Appreciate followup nephrology consultation.  5. Hyperlipidemia, on statin therapy.  6. Type 2 diabetes mellitus.  7. Hypertension - well controlled  8. COPD.  9. ? UTI with large leuk est positive, many bacteria and 11-20 WBC's. Culture read out >100,000 colonies of mixed bacteria ? Contaminant. Repeat urine culture show same but with no predominant species. Discussed with renal.  Will hold off on antibiotics since he is afebrile and asymptomatic.  If he develops low grade fever then would treat. 10. Nonsustained atrial tachycardia - no further episodes.   Continue BB for now.  11.  Nonsustained ventricular tachycardia 6 beats - continue BB   Sueanne Margarita, MD  04/27/2014  6:34 AM

## 2014-04-27 NOTE — Progress Notes (Signed)
Pt received with indwelling urinary catheter, claimed foley cath needs to be change today(30 days due date today). [facility] PA notified, with order. New foley cath reinsertion attempted  By 2 staff to no avail.[facility]atter PA aware.

## 2014-04-27 NOTE — H&P (View-Only) (Signed)
SUBJECTIVE:  Denies chest pain or SOB  OBJECTIVE:   Vitals:   Filed Vitals:   04/26/14 1133 04/26/14 1134 04/26/14 1331 04/26/14 2030  BP: 168/71 168/71 118/59 117/65  Pulse:  45 73 79  Temp:   97.7 F (36.5 C) 98 F (36.7 C)  TempSrc:   Oral Oral  Resp:   18 16  Height:      Weight:      SpO2:   97% 96%   I&O's:   Intake/Output Summary (Last 24 hours) at 04/27/14 1610 Last data filed at 04/26/14 1900  Gross per 24 hour  Intake 737.67 ml  Output    600 ml  Net 137.67 ml   TELEMETRY: Reviewed telemetry pt in NSR with frequent PAC's and a short run of NSVT 6 beats:     PHYSICAL EXAM General: Well developed, well nourished, in no acute distress Head: Eyes PERRLA, No xanthomas.   Normal cephalic and atramatic  Lungs:   Clear bilaterally to auscultation and percussion. Heart:   HRRR S1 S2 Pulses are 2+ & equal. 2/6 SM at RUSB to LLSB Abdomen: Bowel sounds are positive, abdomen soft and non-tender without masses Extremities:   No clubbing, cyanosis or edema.  DP +1 Neuro: Alert and oriented X 3. Psych:  Good affect, responds appropriately   LABS: Basic Metabolic Panel:  Recent Labs  04/25/14 0608 04/26/14 0345  NA 140 136*  K 3.4* 3.5*  CL 99 97  CO2 21 21  GLUCOSE 125* 208*  BUN 73* 72*  CREATININE 5.55* 5.72*  CALCIUM 9.8 9.6  PHOS 5.9* 5.1*   Liver Function Tests:  Recent Labs  04/25/14 0608 04/26/14 0345  ALBUMIN 2.7* 2.7*   No results found for this basename: LIPASE, AMYLASE,  in the last 72 hours CBC:  Recent Labs  04/26/14 0345 04/27/14 0512  WBC 11.7* 12.2*  HGB 9.8* 10.0*  HCT 30.5* 31.1*  MCV 91.6 91.5  PLT 293 288   Cardiac Enzymes: No results found for this basename: CKTOTAL, CKMB, CKMBINDEX, TROPONINI,  in the last 72 hours BNP: No components found with this basename: POCBNP,  D-Dimer: No results found for this basename: DDIMER,  in the last 72 hours Hemoglobin A1C: No results found for this basename: HGBA1C,  in the last  72 hours Fasting Lipid Panel: No results found for this basename: CHOL, HDL, LDLCALC, TRIG, CHOLHDL, LDLDIRECT,  in the last 72 hours Thyroid Function Tests: No results found for this basename: TSH, T4TOTAL, FREET3, T3FREE, THYROIDAB,  in the last 72 hours Anemia Panel: No results found for this basename: VITAMINB12, FOLATE, FERRITIN, TIBC, IRON, RETICCTPCT,  in the last 72 hours Coag Panel:   Lab Results  Component Value Date   INR 1.15 04/21/2014    RADIOLOGY: Dg Chest 2 View  04/21/2014   CLINICAL DATA:  Sharp left chest pain, cough, COPD  EXAM: CHEST  2 VIEW  COMPARISON:  02/02/2014  FINDINGS: Chronic interstitial markings/emphysematous changes. No focal consolidation. No pleural effusion or pneumothorax.  The heart is normal in size. Postsurgical changes related to prior CABG.  Degenerative changes of the visualized thoracolumbar spine.  IMPRESSION: No evidence of acute cardiopulmonary disease.   Electronically Signed   By: Julian Hy M.D.   On: 04/21/2014 02:20   Dg Chest Port 1 View  04/24/2014   CLINICAL DATA:  Status post placement dialysis catheter.  EXAM: PORTABLE CHEST - 1 VIEW  COMPARISON:  PA and lateral chest 04/21/2014.  FINDINGS: Right IJ  approach central venous catheter is in place with the tips in the mid superior vena cava. There is no pneumothorax. The lungs appear emphysematous. The patient is status post CABG. Heart size is upper normal.  IMPRESSION: Right IJ catheter tip projects over the mid superior vena cava. Negative for pneumothorax.  No acute cardiopulmonary disease.  Emphysema.   Electronically Signed   By: Inge Rise M.D.   On: 04/24/2014 16:57    Assessment/Plan:   1. NSTEMI. Initial troponin I 3.9. Troponin now trending downward. Currently chest pain-free on heparin. Cath yesterday showed severe 3 vessel ASCAD with patent LIMA to LAD, patent SVG to diag and SVG to OM, occluded SVG to ramus, moderate AS and normal right heart and LV filling pressures.  He has a 70% stenosis in the ostium of an ungrafted RCA and 90% mid RCA. Plan is for possible complex PCI of the RCA today. Continue ASA/statin/BB/IV Heparin/Imdur  2. CAD status post prior CABG. LVEF 65-70% in 2013.  3. Moderate aortic stenosis by cath.  4. CKD, stage 5. Creatinine 5.72 post cath. Appreciate followup nephrology consultation.  5. Hyperlipidemia, on statin therapy.  6. Type 2 diabetes mellitus.  7. Hypertension - well controlled  8. COPD.  9. ? UTI with large leuk est positive, many bacteria and 11-20 WBC's. Culture read out >100,000 colonies of mixed bacteria ? Contaminant. Repeat urine culture show same but with no predominant species. Discussed with renal.  Will hold off on antibiotics since he is afebrile and asymptomatic.  If he develops low grade fever then would treat. 10. Nonsustained atrial tachycardia - no further episodes.   Continue BB for now.  11.  Nonsustained ventricular tachycardia 6 beats - continue BB   Sueanne Margarita, MD  04/27/2014  6:34 AM

## 2014-04-27 NOTE — Progress Notes (Signed)
UR completed Crystal K. Hutchinson, RN, BSN, [place], CCM  04/27/2014 2:51 PM

## 2014-04-27 NOTE — CV Procedure (Signed)
PERCUTANEOUS CORONARY INTERVENTION   Raymond [name] is a 80 y.o. male  INDICATION:  NSTEMI and severe diffuse CAD in RCA with bypass graft failure   PROCEDURE: 1. Rotational atherectomy RCA; 2. Drug-eluting stent implantation; 3. Temporary transvenous pacemaker   CONSENT: The risks, benefits, and details of the procedure were explained to the patient. Risks including death, MI, stroke, bleeding, limb ischemia, emergency CABG, renal failure and allergy were described and accepted by the patient.  Informed written consent  was obtained prior to proceeding.  PROCEDURE TECHNIQUE:  After Xylocaine anesthesia a 7 French sheath was placed in the left femoral artery and a 6 French sheath in the left femoral vein both with with a single anterior needle wall sticks.   Coronary guiding shots were made using a 7 [place] catheter. Antithrombotic therapy, Angiomax bolus and infusion, was begun and determined to be therapeutic by ACT. Antiplatelet therapy, 600 mg of Plavix, was loaded.  We placed a BMW wire distal to the stenosis and used an over the wire 1.5 balloon. We then exchanged for the Roto-Floppy 300 cm wire. We then loaded a 1.5 mm burr and performed 5 runs initially starting in the ostium but concentrating on the mid vessel. We then removed the 1.5 mm burr and loaded a 2.0 mm device. The case was complicated by ostial disease that led to pressure damping and reduce blood pressure when engaged in the ostium of the right coronary. Heart initial attempt to position the 2.0 mm burr in the ostium led to the equipment on calling out of the coronary. We then had to start over by repositioning the Roto floppy wire distally. We were then able to advance the 2.0 burr to the ostium and do one 30 second run.  A 3.0 x 15 balloon was used to to predilate the mid RCA at the ostium. We then placed a BMW buddy wire, positioned and deployed a 3.0 x 28 mm long Xience Alpine DES at  14 atmospheres. Two inflations were performed. The buddy wire was removed prior to deployment of the stent. The buddy wire was reinserted and a 3.5 x 15 [place] Emerge was used to post-dilate to 12 atmospheres in overlapping fashion throughout the stent. 35 balloon was also used to further dilate the ostium to 18 atmospheres. We removed the buddy wire and then positioned and deployed a 3.5 x 12 mm Xience Alpine to 14 atmospheres in the ostium of the right coronary. 3 balloon inflations were performed. We then positioned and post dilated using a 4.0 x 8 mm [place] Emerge to 14 atmospheres in overlapping fashion.  The patient tolerated the procedure although he was quite uncomfortable.   CONTRAST:  Total of 206 cc.  COMPLICATIONS:  Hypotension during engagement of the native right coronary.    ANGIOGRAPHIC RESULTS:   80% ostial and tandem 90-95% mid calcified stenoses were reduced to 0% and 0% with TIMI grade 3 flow following rotational atherectomy in the  patient.   IMPRESSIONS:  Successful complicated rotational atherectomy and drug-eluting stent implantation into the RCA ostium and a segmental highly diseased mid vessel reducing 80% ostial and tandem 90-95% mid stenoses to 0% with TIMI grade 3 flow.   RECOMMENDATION:  Aspirin and Plavix indefinitely. Track renal function as contrast load was quite heavy. Should not be discharged from the hospital until these 48 hours out from the procedure with reference to following renal function and the possible need for dialysis.Marland Kitchen  Sinclair Grooms, MD 04/27/2014 10:29 AM

## 2014-04-27 NOTE — Consult Note (Signed)
Urology Consult  Referring physician: Linard Millers Reason for referral: could not insert catheter  Chief Complaint: could not insert catheter  History of Present Illness: Patient has chronic foley; changes it every month; staff took out catheter and could not reinsert; had pacemake and stent today Foley for 4 months Followed by Dr Karsten Ro Modifying factors: There are no other modifying factors  Associated signs and symptoms: There are no other associated signs and symptoms Aggravating and relieving factors: There are no other aggravating or relieving factors Severity: Moderate Duration: Persistent  Past Medical History  Diagnosis Date  . COPD (chronic obstructive pulmonary disease)   . Hypertension   . CAD (coronary artery disease), autologous vein bypass graft   . High cholesterol   . Acute chest pain 08/18/2012    "cardiologist said it was not my heart" (08/18/2012)  . Pneumonia 1938  . Shortness of breath     "related to the COPD" (08/18/2012)  . Type II diabetes mellitus   . History of blood transfusion 1938  . Colon cancer   . Chronic kidney disease, stage 4 (severe)   . Arthritis     "back, hands, neck" (08/18/2012)  . History of pancreatitis ~ 1985  . Anginal pain    Past Surgical History  Procedure Laterality Date  . Appendectomy  1938  . Inguinal hernia repair  2000's    bilaterally  . Cataract extraction w/ intraocular lens  implant, bilateral  2000's  . Coronary artery bypass graft  2000's    CABG X4  . Colectomy  2000's    "cancer; prior to having double hernia operation" (08/18/2012)    Medications: I have reviewed the patient's current medications. Allergies:  Allergies  Allergen Reactions  . Codeine Nausea And Vomiting  . Dilaudid [Hydromorphone Hcl] Nausea And Vomiting    "I get deathly sick"; diaphoresis    Family History  Problem Relation Age of Onset  . Heart disease Mother   . Heart disease Father    Social History:  reports that he quit  smoking about 30 years ago. His smoking use included Cigarettes. He has a 120 pack-year smoking history. He has never used smokeless tobacco. He reports that he drinks alcohol. He reports that he does not use illicit drugs.  ROS: All systems are reviewed and negative except as noted. Rest negative  Physical Exam:  Vital signs in last 24 hours: Temp:  [98 F (36.7 C)-98.2 F (36.8 C)] 98.2 F (36.8 C) (06/19 1600) Pulse Rate:  [79-98] 98 (06/19 1600) Resp:  [16-20] 20 (06/19 1600) BP: (117-169)/(57-92) 161/68 mmHg (06/19 1600) SpO2:  [93 %-96 %] 94 % (06/19 1600) Weight:  [63.095 kg (139 lb 1.6 oz)] 63.095 kg (139 lb 1.6 oz) (06/19 0530)  Cardiovascular: Skin warm; not flushed Respiratory: Breaths quiet; no shortness of breath Abdomen: No masses Neurological: Normal sensation to touch Musculoskeletal: Normal motor function arms and legs Lymphatics: No inguinal adenopathy Skin: No rashes Genitourinary:normal genitalia/flat abdomen and blood at meatus  Laboratory Data:  Results for orders placed during the hospital encounter of 04/21/14 (from the past 72 hour(s))  GLUCOSE, CAPILLARY     Status: Abnormal   Collection Time    04/24/14  9:19 PM      Result Value Ref Range   Glucose-Capillary 242 (*) 70 - 99 mg/dL   Comment 1 Documented in Chart     Comment 2 Notify RN    HEPARIN LEVEL (UNFRACTIONATED)     Status: None   Collection  Time    04/25/14  6:08 AM      Result Value Ref Range   Heparin Unfractionated 0.41  0.30 - 0.70 IU/mL   Comment:            IF HEPARIN RESULTS ARE BELOW     EXPECTED VALUES, AND PATIENT     DOSAGE HAS BEEN CONFIRMED,     SUGGEST FOLLOW UP TESTING     OF ANTITHROMBIN III LEVELS.  CBC     Status: Abnormal   Collection Time    04/25/14  6:08 AM      Result Value Ref Range   WBC 11.5 (*) 4.0 - 10.5 K/uL   RBC 3.39 (*) 4.22 - 5.81 MIL/uL   Hemoglobin 10.2 (*) 13.0 - 17.0 g/dL   HCT 31.0 (*) 39.0 - 52.0 %   MCV 91.4  78.0 - 100.0 fL   MCH 30.1   26.0 - 34.0 pg   MCHC 32.9  30.0 - 36.0 g/dL   RDW 15.6 (*) 11.5 - 15.5 %   Platelets 291  150 - 400 K/uL  RENAL FUNCTION PANEL     Status: Abnormal   Collection Time    04/25/14  6:08 AM      Result Value Ref Range   Sodium 140  137 - 147 mEq/L   Potassium 3.4 (*) 3.7 - 5.3 mEq/L   Chloride 99  96 - 112 mEq/L   CO2 21  19 - 32 mEq/L   Glucose, Bld 125 (*) 70 - 99 mg/dL   BUN 73 (*) 6 - 23 mg/dL   Creatinine, Ser 5.55 (*) 0.50 - 1.35 mg/dL   Calcium 9.8  8.4 - 10.5 mg/dL   Phosphorus 5.9 (*) 2.3 - 4.6 mg/dL   Albumin 2.7 (*) 3.5 - 5.2 g/dL   GFR calc non Af Amer 9 (*) >90 mL/min   GFR calc Af Amer 10 (*) >90 mL/min   Comment: (NOTE)     The eGFR has been calculated using the CKD EPI equation.     This calculation has not been validated in all clinical situations.     eGFR's persistently <90 mL/min signify possible Chronic Kidney     Disease.  GLUCOSE, CAPILLARY     Status: Abnormal   Collection Time    04/25/14  6:10 AM      Result Value Ref Range   Glucose-Capillary 123 (*) 70 - 99 mg/dL  POCT I-STAT 3, ART BLOOD GAS (G3+)     Status: Abnormal   Collection Time    04/25/14 11:58 AM      Result Value Ref Range   pH, Arterial 7.406  7.350 - 7.450   pCO2 arterial 37.4  35.0 - 45.0 mmHg   pO2, Arterial 68.0 (*) 80.0 - 100.0 mmHg   Bicarbonate 23.5  20.0 - 24.0 mEq/L   TCO2 25  0 - 100 mmol/L   O2 Saturation 93.0     Acid-base deficit 1.0  0.0 - 2.0 mmol/L   Sample type ARTERIAL    POCT I-STAT 3, VENOUS BLOOD GAS (G3P V)     Status: Abnormal   Collection Time    04/25/14 11:58 AM      Result Value Ref Range   pH, Ven 7.359 (*) 7.250 - 7.300   pCO2, Ven 38.3 (*) 45.0 - 50.0 mmHg   pO2, Ven 29.0 (*) 30.0 - 45.0 mmHg   Bicarbonate 21.6  20.0 - 24.0 mEq/L   TCO2 23  0 -  100 mmol/L   O2 Saturation 53.0     Acid-base deficit 3.0 (*) 0.0 - 2.0 mmol/L   Sample type MIXED VENOUS SAMPLE     Comment NOTIFIED PHYSICIAN    GLUCOSE, CAPILLARY     Status: Abnormal   Collection  Time    04/25/14  1:04 PM      Result Value Ref Range   Glucose-Capillary 145 (*) 70 - 99 mg/dL  GLUCOSE, CAPILLARY     Status: Abnormal   Collection Time    04/25/14  4:02 PM      Result Value Ref Range   Glucose-Capillary 209 (*) 70 - 99 mg/dL  GLUCOSE, CAPILLARY     Status: Abnormal   Collection Time    04/25/14  9:22 PM      Result Value Ref Range   Glucose-Capillary 295 (*) 70 - 99 mg/dL   Comment 1 Documented in Chart     Comment 2 Notify RN    CBC     Status: Abnormal   Collection Time    04/26/14  3:45 AM      Result Value Ref Range   WBC 11.7 (*) 4.0 - 10.5 K/uL   RBC 3.33 (*) 4.22 - 5.81 MIL/uL   Hemoglobin 9.8 (*) 13.0 - 17.0 g/dL   HCT 30.5 (*) 39.0 - 52.0 %   MCV 91.6  78.0 - 100.0 fL   MCH 29.4  26.0 - 34.0 pg   MCHC 32.1  30.0 - 36.0 g/dL   RDW 15.9 (*) 11.5 - 15.5 %   Platelets 293  150 - 400 K/uL  RENAL FUNCTION PANEL     Status: Abnormal   Collection Time    04/26/14  3:45 AM      Result Value Ref Range   Sodium 136 (*) 137 - 147 mEq/L   Potassium 3.5 (*) 3.7 - 5.3 mEq/L   Chloride 97  96 - 112 mEq/L   CO2 21  19 - 32 mEq/L   Glucose, Bld 208 (*) 70 - 99 mg/dL   BUN 72 (*) 6 - 23 mg/dL   Creatinine, Ser 5.72 (*) 0.50 - 1.35 mg/dL   Calcium 9.6  8.4 - 10.5 mg/dL   Phosphorus 5.1 (*) 2.3 - 4.6 mg/dL   Albumin 2.7 (*) 3.5 - 5.2 g/dL   GFR calc non Af Amer 8 (*) >90 mL/min   GFR calc Af Amer 10 (*) >90 mL/min   Comment: (NOTE)     The eGFR has been calculated using the CKD EPI equation.     This calculation has not been validated in all clinical situations.     eGFR's persistently <90 mL/min signify possible Chronic Kidney     Disease.  HEPARIN LEVEL (UNFRACTIONATED)     Status: Abnormal   Collection Time    04/26/14  3:45 AM      Result Value Ref Range   Heparin Unfractionated 0.29 (*) 0.30 - 0.70 IU/mL   Comment:            IF HEPARIN RESULTS ARE BELOW     EXPECTED VALUES, AND PATIENT     DOSAGE HAS BEEN CONFIRMED,     SUGGEST FOLLOW UP  TESTING     OF ANTITHROMBIN III LEVELS.  GLUCOSE, CAPILLARY     Status: Abnormal   Collection Time    04/26/14  6:33 AM      Result Value Ref Range   Glucose-Capillary 162 (*) 70 - 99 mg/dL  GLUCOSE,  CAPILLARY     Status: Abnormal   Collection Time    04/26/14 11:17 AM      Result Value Ref Range   Glucose-Capillary 108 (*) 70 - 99 mg/dL  GLUCOSE, CAPILLARY     Status: Abnormal   Collection Time    04/26/14  4:30 PM      Result Value Ref Range   Glucose-Capillary 231 (*) 70 - 99 mg/dL   Comment 1 Documented in Chart     Comment 2 Notify RN    GLUCOSE, CAPILLARY     Status: Abnormal   Collection Time    04/26/14  9:00 PM      Result Value Ref Range   Glucose-Capillary 341 (*) 70 - 99 mg/dL  CBC     Status: Abnormal   Collection Time    04/27/14  5:12 AM      Result Value Ref Range   WBC 12.2 (*) 4.0 - 10.5 K/uL   RBC 3.40 (*) 4.22 - 5.81 MIL/uL   Hemoglobin 10.0 (*) 13.0 - 17.0 g/dL   HCT 31.1 (*) 39.0 - 52.0 %   MCV 91.5  78.0 - 100.0 fL   MCH 29.4  26.0 - 34.0 pg   MCHC 32.2  30.0 - 36.0 g/dL   RDW 15.6 (*) 11.5 - 15.5 %   Platelets 288  150 - 400 K/uL  HEPARIN LEVEL (UNFRACTIONATED)     Status: None   Collection Time    04/27/14  5:12 AM      Result Value Ref Range   Heparin Unfractionated 0.56  0.30 - 0.70 IU/mL   Comment:            IF HEPARIN RESULTS ARE BELOW     EXPECTED VALUES, AND PATIENT     DOSAGE HAS BEEN CONFIRMED,     SUGGEST FOLLOW UP TESTING     OF ANTITHROMBIN III LEVELS.  RENAL FUNCTION PANEL     Status: Abnormal   Collection Time    04/27/14  5:12 AM      Result Value Ref Range   Sodium 136 (*) 137 - 147 mEq/L   Potassium 3.8  3.7 - 5.3 mEq/L   Chloride 97  96 - 112 mEq/L   CO2 23  19 - 32 mEq/L   Glucose, Bld 75  70 - 99 mg/dL   BUN 73 (*) 6 - 23 mg/dL   Creatinine, Ser 5.88 (*) 0.50 - 1.35 mg/dL   Calcium 9.7  8.4 - 10.5 mg/dL   Phosphorus 4.2  2.3 - 4.6 mg/dL   Albumin 2.8 (*) 3.5 - 5.2 g/dL   GFR calc non Af Amer 8 (*) >90 mL/min    GFR calc Af Amer 9 (*) >90 mL/min   Comment: (NOTE)     The eGFR has been calculated using the CKD EPI equation.     This calculation has not been validated in all clinical situations.     eGFR's persistently <90 mL/min signify possible Chronic Kidney     Disease.  IRON AND TIBC     Status: Abnormal   Collection Time    04/27/14  5:12 AM      Result Value Ref Range   Iron 31 (*) 42 - 135 ug/dL   TIBC 130 (*) 215 - 435 ug/dL   Saturation Ratios 24  20 - 55 %   UIBC 99 (*) 125 - 400 ug/dL   Comment: Performed at [facility]  Status: Abnormal   Collection Time    04/27/14  5:12 AM      Result Value Ref Range   Ferritin 1365 (*) 22 - 322 ng/mL   Comment: Performed at [facility], CAPILLARY     Status: None   Collection Time    04/27/14  6:00 AM      Result Value Ref Range   Glucose-Capillary 80  70 - 99 mg/dL  POCT ACTIVATED CLOTTING TIME     Status: None   Collection Time    04/27/14  8:47 AM      Result Value Ref Range   Activated Clotting Time 292    GLUCOSE, CAPILLARY     Status: Abnormal   Collection Time    04/27/14 10:58 AM      Result Value Ref Range   Glucose-Capillary 133 (*) 70 - 99 mg/dL  GLUCOSE, CAPILLARY     Status: Abnormal   Collection Time    04/27/14  1:49 PM      Result Value Ref Range   Glucose-Capillary 154 (*) 70 - 99 mg/dL   Comment 1 Notify RN    GLUCOSE, CAPILLARY     Status: Abnormal   Collection Time    04/27/14  5:36 PM      Result Value Ref Range   Glucose-Capillary 149 (*) 70 - 99 mg/dL   Comment 1 Notify RN     Recent Results (from the past 240 hour(s))  URINE CULTURE     Status: None   Collection Time    04/21/14  8:15 AM      Result Value Ref Range Status   Specimen Description URINE, CATHETERIZED   Final   Special Requests Normal   Final   Culture  Setup Time     Final   Value: 04/21/2014 18:42     Performed at [facility]     Final   Value: >=100,000 COLONIES/ML      Performed at [facility]   Culture     Final   Value: Multiple bacterial morphotypes present, none predominant. Suggest appropriate recollection if clinically indicated.     Performed at [facility]   Report Status 04/23/2014 FINAL   Final  URINE CULTURE     Status: None   Collection Time    04/24/14 10:44 AM      Result Value Ref Range Status   Specimen Description URINE, CATHETERIZED   Final   Special Requests NONE   Final   Culture  Setup Time     Final   Value: 04/24/2014 15:36     Performed at [facility]     Final   Value: >=100,000 COLONIES/ML     Performed at [facility]   Culture     Final   Value: Multiple bacterial morphotypes present, none predominant. Suggest appropriate recollection if clinically indicated.     Performed at [facility]   Report Status 04/25/2014 FINAL   Final   Creatinine:  Recent Labs  04/21/14 0100 04/21/14 0845 04/23/14 0414 04/24/14 0404 04/25/14 7425 04/26/14 0345 04/27/14 0512  CREATININE 5.06* 5.12* 5.29* 5.60* 5.55* 5.72* 5.88*    Xrays: See report/chart none  Impression/Assessment:  Failed foley   Plan:  16 Fr foley inserted with excellent help from staff Urine clear Well tolerated Educated staff/patient re "30 days" I am very concerned that catheter was removed tonight shortly  after a cardiac procedure-recommend education regarding this  Almin Livingstone A 04/27/2014, 6:30 PM

## 2014-04-27 NOTE — Interval H&P Note (Signed)
History and Physical Interval Note:  04/27/2014 7:50 AM Renal function severe but stable since cath. Raymond [name]  has presented today for surgery, with the diagnosis of blockage  The various methods of treatment have been discussed with the patient and family. After consideration of risks, benefits and other options for treatment, the patient has consented to  Procedure(s): PERCUTANEOUS CORONARY ROTOBLATOR INTERVENTION (PCI-R) (N/A) as a surgical intervention .  The patient's history has been reviewed, patient examined, no change in status, stable for surgery.  I have reviewed the patient's chart and labs.  Questions were answered to the patient's satisfaction.    High risk nature of procedure re-discussed.  Sinclair Grooms

## 2014-04-27 NOTE — Progress Notes (Signed)
Assessment:  80 yo WM with a background of CAD, COPD, DM, HTN, advanced CKD 5 (creatinine in the 5's) (followed by Dr. Moshe Cipro and has wanted to do PD as his outpt option when reaching ESRD) who is admitted with NSTEMI, and we were asked to see   Stage 5 CKD with high risk for ESRD - pt has wanted to do PD as his dialysis option, which is why he has no permanent access.   He talked with me, Dr. Moshe Cipro (by phone) Dr. Radford Pax, and Dr. Tamala Julian - decided to proceed with aggressive approach; has had cath and to have  PCI of RCA today with risks as outlined in Dr.Smith's note He has a temporary access in place should he need urgently.  As the picture evolves can be switched out for tunnelled HD cath if need be. He may well require initiation of dialysis after today's procedure Dr. Radford Pax states if requires permanent dialysis could be cleared for AVF/other procedures   Weight and volume status stable at this time, he is not SOB,  Creatinine has been stable (though GFR quite low) - labs still pending from this AM NSTEMI/CAD/Prior CABG - for PCI of RCA today Mod to severe AS - Not felt to be in need of intervention DM HTN HLD COPD Secondary hyperpara - on hectorol; added renvela for hyperphos Anemia - Hb just under 10 now; check iron studies; if consistently low add ESA; iron studies ordered and pending Asymptomatic bacteruria with mix flora on culture - no rx unless fever  Subjective: Interval History:  Had right IJ temp HD cath placed 6/16 Cath 6/17 results noted For PCI of RCA today  Objective: Vital signs in last 24 hours: Temp:  [97.7 F (36.5 C)-98 F (36.7 C)] 98 F (36.7 C) (06/18 2030) Pulse Rate:  [45-79] 79 (06/18 2030) Resp:  [16-18] 16 (06/18 2030) BP: (117-168)/(59-71) 117/65 mmHg (06/18 2030) SpO2:  [93 %-97 %] 96 % (06/18 2030) Weight change:   Intake/Output from previous day: 06/18 0701 - 06/19 0700 In: 737.7 [P.O.:600; I.V.:137.7] Out: 600  [Urine:600] Intake/Output this shift:    WeightTrending: 04/26/14 0639 63.2 kg  04/24/14 1417 65.7 kg (144 lb 13.5 oz) 04/23/14 0500 65.7 kg (144 lb 13.5 oz) 04/22/14 0539 65.1 kg (143 lb 8.3  BP 117/65  Pulse 79  Temp(Src) 98 F (36.7 C) (Oral)  Resp 16  Ht 5\' 6"  (1.676 m)  Wt 63.2 kg (139 lb 5.3 oz)  BMI 22.50 kg/m2  SpO2 96% Awake, alert, very matter of fact and comfortable with plans NAD IJ temp HD cath on the right Resp: No crackles at this time Chest wall: old sternotomy scar Cardio: regular rate and rhythm, S1, S2 normal  Systolic murmur aortic area and at apex Extremities: No LE edema  Recent Labs  04/26/14 0345 04/27/14 0512  WBC 11.7* 12.2*  HGB 9.8* 10.0*  HCT 30.5* 31.1*  PLT 293 288    Recent Labs  04/26/14 0345 04/27/14 0512  NA 136* 136*  K 3.5* 3.8  CL 97 97  CO2 21 23  GLUCOSE 208* 75  BUN 72* 73*  CREATININE 5.72* 5.88*  CALCIUM 9.6 9.7   Results for KYRELL, RUACHO (MRN [id number]) as of 04/27/2014 06:41  Ref. Range 04/27/2014 05:12  Phosphorus Latest Range: 2.3-4.6 mg/dL 4.2    Studies/Results: No results found. Scheduled: . atorvastatin  80 mg Oral q1800  . clopidogrel  300 mg Oral Once  . clopidogrel  75 mg Oral Q  breakfast  . docusate sodium  100 mg Oral Daily  . doxazosin  4 mg Oral Daily  . furosemide  80 mg Oral BID  . insulin aspart  0-9 Units Subcutaneous QID  . insulin aspart  2 Units Subcutaneous QAC supper  . insulin aspart  4 Units Subcutaneous QAC breakfast  . insulin NPH Human  22 Units Subcutaneous QAC breakfast  . isosorbide mononitrate  60 mg Oral Daily  . lipase/protease/amylase  2 capsule Oral TID WC  . metoprolol  75 mg Oral BID  . mometasone-formoterol  2 puff Inhalation BID  . multivitamin with minerals  1 tablet Oral Daily  . pantoprazole  80 mg Oral Daily  . paricalcitol  1 mcg Oral QODAY  . sevelamer carbonate  800 mg Oral TID WC  . sodium chloride  3 mL Intravenous Q12H  . tiotropium  18 mcg  Inhalation Daily   . sodium chloride 1 mL/kg/hr (04/27/14 0116)  . heparin 1,000 Units/hr (04/26/14 0514)     LOS: 6 days   DUNHAM,CYNTHIA B 04/27/2014,6:45 AM

## 2014-04-28 LAB — BASIC METABOLIC PANEL
BUN: 76 mg/dL — ABNORMAL HIGH (ref 6–23)
CALCIUM: 9.8 mg/dL (ref 8.4–10.5)
CO2: 20 mEq/L (ref 19–32)
Chloride: 98 mEq/L (ref 96–112)
Creatinine, Ser: 5.98 mg/dL — ABNORMAL HIGH (ref 0.50–1.35)
GFR calc Af Amer: 9 mL/min — ABNORMAL LOW (ref >90)
GFR calc non Af Amer: 8 mL/min — ABNORMAL LOW (ref >90)
GLUCOSE: 94 mg/dL (ref 70–99)
Potassium: 4 mEq/L (ref 3.7–5.3)
SODIUM: 137 meq/L (ref 137–147)

## 2014-04-28 LAB — CBC
HCT: 28.8 % — ABNORMAL LOW (ref 39.0–52.0)
Hemoglobin: 9.2 g/dL — ABNORMAL LOW (ref 13.0–17.0)
MCH: 29.2 pg (ref 26.0–34.0)
MCHC: 31.9 g/dL (ref 30.0–36.0)
MCV: 91.4 fL (ref 78.0–100.0)
PLATELETS: 258 10*3/uL (ref 150–400)
RBC: 3.15 MIL/uL — ABNORMAL LOW (ref 4.22–5.81)
RDW: 16 % — ABNORMAL HIGH (ref 11.5–15.5)
WBC: 14.6 10*3/uL — ABNORMAL HIGH (ref 4.0–10.5)

## 2014-04-28 LAB — RENAL FUNCTION PANEL
ALBUMIN: 2.5 g/dL — AB (ref 3.5–5.2)
BUN: 75 mg/dL — AB (ref 6–23)
CO2: 21 mEq/L (ref 19–32)
Calcium: 9.7 mg/dL (ref 8.4–10.5)
Chloride: 98 mEq/L (ref 96–112)
Creatinine, Ser: 5.89 mg/dL — ABNORMAL HIGH (ref 0.50–1.35)
GFR calc Af Amer: 9 mL/min — ABNORMAL LOW (ref >90)
GFR calc non Af Amer: 8 mL/min — ABNORMAL LOW (ref >90)
GLUCOSE: 92 mg/dL (ref 70–99)
POTASSIUM: 4.1 meq/L (ref 3.7–5.3)
Phosphorus: 5.4 mg/dL — ABNORMAL HIGH (ref 2.3–4.6)
Sodium: 138 mEq/L (ref 137–147)

## 2014-04-28 LAB — GLUCOSE, CAPILLARY
GLUCOSE-CAPILLARY: 98 mg/dL (ref 70–99)
GLUCOSE-CAPILLARY: 110 mg/dL — AB (ref 70–99)
Glucose-Capillary: 81 mg/dL (ref 70–99)
Glucose-Capillary: 116 mg/dL — ABNORMAL HIGH (ref 70–99)
Glucose-Capillary: 90 mg/dL (ref 70–99)

## 2014-04-28 NOTE — Progress Notes (Signed)
Tolerated ambulation in hallway with rolling walker and 1+ assist, returned to room to sit in recliner chair Joylene Draft A

## 2014-04-28 NOTE — Progress Notes (Signed)
CARDIAC REHAB PHASE I   PRE:  Rate/Rhythm:   BP:  Supine:   Sitting: 142/63  Standing:    SaO2: 94% RA  MODE:  Ambulation: adamantly refused to ambulate     POST:  Rate/Rhythm:   BP:  Supine: Sitting:   Standing:    SaO2:  Pt observed sleeping but aroused easily.  Pt declined several times to ambulate in hallway.  Requested rehab staff return at the "end of morning - one minute before noon".  Pt finally agreed to allow nursing staff to provide care. Assisted nursing staff with turning, peri-care and change linens.  Pt fell back to sleep.  Call bell placed within reach.  Will try to follow up later in the day when transferred.  Cherre Huger, BSN [phone]

## 2014-04-28 NOTE — Progress Notes (Signed)
SUBJECTIVE:  No chest pain.  No SOB.     PHYSICAL EXAM Filed Vitals:   04/27/14 2000 04/27/14 2003 04/28/14 0024 04/28/14 0506  BP:  114/67 105/57 131/62  Pulse: 40 72 84 85  Temp:  97.7 F (36.5 C) 98.1 F (36.7 C) 97.6 F (36.4 C)  TempSrc:  Oral Oral Oral  Resp:  18 18 17   Height:      Weight:   142 lb 3.2 oz (64.5 kg)   SpO2: 95% 93% 92% 93%   General:  No distress HEENT:  Dentures Lungs:  Clear Heart:  RRR Abdomen:  Positive bowel sounds, no rebound no guarding Extremities:  Left groin with bruit, no pulsatile mass.  No pain.  Neuro:  Nonfocal  LABS:  Results for orders placed during the hospital encounter of 04/21/14 (from the past 24 hour(s))  POCT ACTIVATED CLOTTING TIME     Status: None   Collection Time    04/27/14  8:47 AM      Result Value Ref Range   Activated Clotting Time 292    GLUCOSE, CAPILLARY     Status: Abnormal   Collection Time    04/27/14 10:58 AM      Result Value Ref Range   Glucose-Capillary 133 (*) 70 - 99 mg/dL  GLUCOSE, CAPILLARY     Status: Abnormal   Collection Time    04/27/14  1:49 PM      Result Value Ref Range   Glucose-Capillary 154 (*) 70 - 99 mg/dL   Comment 1 Notify RN    GLUCOSE, CAPILLARY     Status: Abnormal   Collection Time    04/27/14  5:36 PM      Result Value Ref Range   Glucose-Capillary 149 (*) 70 - 99 mg/dL   Comment 1 Notify RN    GLUCOSE, CAPILLARY     Status: Abnormal   Collection Time    04/27/14  9:29 PM      Result Value Ref Range   Glucose-Capillary 107 (*) 70 - 99 mg/dL   Comment 1 Notify RN    RENAL FUNCTION PANEL     Status: Abnormal   Collection Time    04/28/14  3:29 AM      Result Value Ref Range   Sodium 138  137 - 147 mEq/L   Potassium 4.1  3.7 - 5.3 mEq/L   Chloride 98  96 - 112 mEq/L   CO2 21  19 - 32 mEq/L   Glucose, Bld 92  70 - 99 mg/dL   BUN 75 (*) 6 - 23 mg/dL   Creatinine, Ser 5.89 (*) 0.50 - 1.35 mg/dL   Calcium 9.7  8.4 - 10.5 mg/dL   Phosphorus 5.4 (*) 2.3 - 4.6  mg/dL   Albumin 2.5 (*) 3.5 - 5.2 g/dL   GFR calc non Af Amer 8 (*) >90 mL/min   GFR calc Af Amer 9 (*) >90 mL/min  BASIC METABOLIC PANEL     Status: Abnormal   Collection Time    04/28/14  3:29 AM      Result Value Ref Range   Sodium 137  137 - 147 mEq/L   Potassium 4.0  3.7 - 5.3 mEq/L   Chloride 98  96 - 112 mEq/L   CO2 20  19 - 32 mEq/L   Glucose, Bld 94  70 - 99 mg/dL   BUN 76 (*) 6 - 23 mg/dL   Creatinine, Ser 5.98 (*) 0.50 - 1.35  mg/dL   Calcium 9.8  8.4 - 10.5 mg/dL   GFR calc non Af Amer 8 (*) >90 mL/min   GFR calc Af Amer 9 (*) >90 mL/min  CBC     Status: Abnormal   Collection Time    04/28/14  3:29 AM      Result Value Ref Range   WBC 14.6 (*) 4.0 - 10.5 K/uL   RBC 3.15 (*) 4.22 - 5.81 MIL/uL   Hemoglobin 9.2 (*) 13.0 - 17.0 g/dL   HCT 28.8 (*) 39.0 - 52.0 %   MCV 91.4  78.0 - 100.0 fL   MCH 29.2  26.0 - 34.0 pg   MCHC 31.9  30.0 - 36.0 g/dL   RDW 16.0 (*) 11.5 - 15.5 %   Platelets 258  150 - 400 K/uL    Intake/Output Summary (Last 24 hours) at 04/28/14 0729 Last data filed at 04/28/14 0700  Gross per 24 hour  Intake 953.33 ml  Output    600 ml  Net 353.33 ml    ASSESSMENT AND PLAN:  NSTEMI. Cath showed severe 3 vessel ASCAD with patent LIMA to LAD, patent SVG to diag and SVG to OM, occluded SVG to ramus, moderate AS and normal right heart and LV filling pressures. He has a 70% stenosis in the ostium of an ungrafted RCA and 90% mid RCA. He, 6/19, had a rotation atherectomy to the RCA with DES.  Dr. Tamala Julian wants him to stay for 48 hours post cath.   (Note bruit left femoral.  I do not see that this noted on the admission note.  However, no other signs of AV fistula or pseudoaneurysm.  Watch overnight and consider imaging prior to discharge.)  Moderate aortic stenosis by cath:  No change in therapy.  CKD, stage 5:  Creat stable 5.89.  Appreciate followup nephrology consultation.   Hyperlipidemia:  Continue statin therapy.   Type 2 diabetes mellitus.    Hypertension:  Well controlled   Urologic:  Difficulty with catheter.  Urology had to reinsert last night.  Culture read out >100,000 colonies of mixed bacteria.  Renal suggested no treatment unless febrile.  The patient has had no fevers.    Nonsustained atrial tachycardia:  No further episodes. Continue BB for now.     Raymond [name] [facility] 04/28/2014 7:29 AM

## 2014-04-28 NOTE — Progress Notes (Signed)
Background  80 yo WM with a background of CAD, COPD, DM, HTN, advanced CKD 5 (creatinine in the 5's) (followed by Dr. Moshe Cipro and has wanted to do PD as his outpt renal replacement option when reaching ESRD) who was admitted with STEMI. Had temp HD cath placed, underwent cath 6/17, then rotoblade atherectomy/PCI/stenting of RCA 6/19. Watching renal function post cath for dialysis indications  Assessment:  Stage 5 CKD with high risk for ESRD  He has a temporary access in place should he need urgently - has not so far.  As the picture evolves could be switched out for tunnelled HD cath if need be  I think he should be watched through Monday at least. Dr. Radford Pax stated could be cleared for AVF/other procedures  Creatinine has been stable (though GFR quite low)  NSTEMI/CAD/Prior CABG - s/p PCI/atherectomy of RCA 6/19 Mod to severe AS - Not felt to be in need of intervention Chronic indwelling foley - was due for change; urology had to be called to replace 6/19 DM HTN HLD COPD Secondary hyperpara - on hectorol; added renvela for hyperphos Anemia - Hb just under 10 now; TSat sl low but ferritin around 1300.  If <10 tomorrow will give a dose of Aranesp Asymptomatic bacteruria with mix flora on culture - no rx unless fever; WBC up post cardiac procedure; reculture urine  Subjective: Feels "good" [place] about about the [place] No SOB Groins no pain Issues getting foley back in last PM - Urology came and replaced UOP only 600 yesterday  Objective: Vital signs in last 24 hours: Temp:  [97.6 F (36.4 C)-98.2 F (36.8 C)] 98.1 F (36.7 C) (06/20 1003) Pulse Rate:  [40-98] 89 (06/20 1003) Resp:  [17-20] 18 (06/20 1003) BP: (105-169)/(57-92) 121/67 mmHg (06/20 1003) SpO2:  [92 %-96 %] 96 % (06/20 1003) Weight:  [64.5 kg (142 lb 3.2 oz)] 64.5 kg (142 lb 3.2 oz) (06/20 0024) Weight change: 1.405 kg (3 lb 1.6 oz)  Intake/Output from previous day: 06/19 0701 - 06/20 0700 In: 953.3  [P.O.:120; I.V.:833.3] Out: 600 [Urine:600] Intake/Output this shift: Total I/O In: 150 [I.V.:150] Out: -   WeightTrending: 04/28/14 0024 64.5 kg  04/26/14 0639 63.2 kg  04/24/14 1417 65.7 kg  04/23/14 0500 65.7 kg  04/22/14 0539 65.1 kg   BP 121/67  Pulse 89  Temp(Src) 98.1 F (36.7 C) (Oral)  Resp 18  Ht 5\' 6"  (1.676 m)  Wt 64.5 kg (142 lb 3.2 oz)  BMI 22.96 kg/m2  SpO2 96% Awake, alert, very matter of fact and comfortable with plans NAD IJ temp HD cath on the right Resp: No crackles  Chest wall: old sternotomy scar Cardio: regular rate and rhythm, S1, S2 normal  Systolic murmur aortic area and at apex Extremities: No LE edema Dressing on left groin, right groin with some bruising; foley clear urine  Recent Labs  04/27/14 0512 04/28/14 0329  WBC 12.2* 14.6*  HGB 10.0* 9.2*  HCT 31.1* 28.8*  PLT 288 258    Recent Labs  04/27/14 0512 04/28/14 0329  NA 136* 138  137  K 3.8 4.1  4.0  CL 97 98  98  CO2 23 21  20   GLUCOSE 75 92  94  BUN 73* 75*  76*  CREATININE 5.88* 5.89*  5.98*  CALCIUM 9.7 9.7  9.8    Results for KARL, ERWAY (MRN [id number]) as of 04/28/2014 12:28  Ref. Range 04/27/2014 05:12  Iron Latest Range: 42-135 ug/dL 31 (L)  UIBC Latest Range: 125-400 ug/dL 99 (L)  TIBC Latest Range: 215-435 ug/dL 130 (L)  Saturation Ratios Latest Range: 20-55 % 24  Ferritin Latest Range: 22-322 ng/mL 1365 (H)    Scheduled Medications . aspirin  81 mg Oral Daily  . atorvastatin  80 mg Oral q1800  . clopidogrel  75 mg Oral Q breakfast  . docusate sodium  100 mg Oral Daily  . doxazosin  4 mg Oral Daily  . furosemide  80 mg Oral BID  . insulin aspart  0-9 Units Subcutaneous QID  . insulin aspart  2 Units Subcutaneous QAC supper  . insulin aspart  4 Units Subcutaneous QAC breakfast  . insulin NPH Human  22 Units Subcutaneous QAC breakfast  . isosorbide mononitrate  60 mg Oral Daily  . lipase/protease/amylase  2 capsule Oral TID WC  .  metoprolol  75 mg Oral BID  . mometasone-formoterol  2 puff Inhalation BID  . multivitamin with minerals  1 tablet Oral Daily  . pantoprazole  80 mg Oral Daily  . paricalcitol  1 mcg Oral QODAY  . sevelamer carbonate  800 mg Oral TID WC  . tiotropium  18 mcg Inhalation Daily   . sodium chloride 50 mL/hr at 04/28/14 1000     LOS: 7 days   DUNHAM,CYNTHIA B 04/28/2014,12:31 PM

## 2014-04-28 NOTE — Progress Notes (Signed)
Pt refused to walk in the hall at this time. Will continue to encourage pt.

## 2014-04-29 LAB — GLUCOSE, CAPILLARY
GLUCOSE-CAPILLARY: 106 mg/dL — AB (ref 70–99)
GLUCOSE-CAPILLARY: 142 mg/dL — AB (ref 70–99)
Glucose-Capillary: 115 mg/dL — ABNORMAL HIGH (ref 70–99)
Glucose-Capillary: 52 mg/dL — ABNORMAL LOW (ref 70–99)
Glucose-Capillary: 71 mg/dL (ref 70–99)
Glucose-Capillary: 85 mg/dL (ref 70–99)

## 2014-04-29 LAB — CBC
HCT: 23.9 % — ABNORMAL LOW (ref 39.0–52.0)
Hemoglobin: 7.8 g/dL — ABNORMAL LOW (ref 13.0–17.0)
MCH: 29.8 pg (ref 26.0–34.0)
MCHC: 32.6 g/dL (ref 30.0–36.0)
MCV: 91.2 fL (ref 78.0–100.0)
PLATELETS: 209 10*3/uL (ref 150–400)
RBC: 2.62 MIL/uL — ABNORMAL LOW (ref 4.22–5.81)
RDW: 16.2 % — AB (ref 11.5–15.5)
WBC: 10.7 10*3/uL — AB (ref 4.0–10.5)

## 2014-04-29 LAB — RENAL FUNCTION PANEL
ALBUMIN: 2.3 g/dL — AB (ref 3.5–5.2)
BUN: 81 mg/dL — ABNORMAL HIGH (ref 6–23)
CHLORIDE: 100 meq/L (ref 96–112)
CO2: 19 mEq/L (ref 19–32)
Calcium: 9.1 mg/dL (ref 8.4–10.5)
Creatinine, Ser: 6.21 mg/dL — ABNORMAL HIGH (ref 0.50–1.35)
GFR calc Af Amer: 9 mL/min — ABNORMAL LOW (ref >90)
GFR, EST NON AFRICAN AMERICAN: 7 mL/min — AB (ref >90)
Glucose, Bld: 122 mg/dL — ABNORMAL HIGH (ref 70–99)
POTASSIUM: 3.8 meq/L (ref 3.7–5.3)
Phosphorus: 4.8 mg/dL — ABNORMAL HIGH (ref 2.3–4.6)
SODIUM: 136 meq/L — AB (ref 137–147)

## 2014-04-29 LAB — ABO/RH: ABO/RH(D): O POS

## 2014-04-29 LAB — PREPARE RBC (CROSSMATCH)

## 2014-04-29 MED ORDER — EPOETIN ALFA 20000 UNIT/ML IJ SOLN
20000.0000 [IU] | Freq: Once | INTRAMUSCULAR | Status: AC
  Administered 2014-04-29: 20000 [IU] via SUBCUTANEOUS
  Filled 2014-04-29: qty 1

## 2014-04-29 MED ORDER — FUROSEMIDE 10 MG/ML IJ SOLN
80.0000 mg | Freq: Once | INTRAMUSCULAR | Status: AC
  Administered 2014-04-29: 80 mg via INTRAVENOUS
  Filled 2014-04-29: qty 8

## 2014-04-29 MED ORDER — GLUCOSE 40 % PO GEL
ORAL | Status: AC
  Administered 2014-04-30: 37.5 g
  Filled 2014-04-29: qty 1

## 2014-04-29 NOTE — Progress Notes (Signed)
Subjective: He reports feeling "lousy"  Objective: Vital signs in last 24 hours: Temp:  [97.4 F (36.3 C)-98.1 F (36.7 C)] 97.4 F (36.3 C) (06/20 2054) Pulse Rate:  [76-95] 95 (06/21 0529) Resp:  [17-20] 20 (06/21 0529) BP: (107-150)/(63-72) 150/64 mmHg (06/21 0529) SpO2:  [95 %-99 %] 95 % (06/21 0529) Last BM Date: 04/27/14  Intake/Output from previous day: 06/20 0701 - 06/21 0700 In: 510 [P.O.:360; I.V.:150] Out: 1200 [Urine:1200] Intake/Output this shift:    Medications Current Facility-Administered Medications  Medication Dose Route Frequency Provider Last Rate Last Dose  . 0.9 %  sodium chloride infusion   Intravenous Continuous Belva Crome III, MD 50 mL/hr at 04/28/14 1633    . albuterol (PROVENTIL) (2.5 MG/3ML) 0.083% nebulizer solution 2.5 mg  2.5 mg Inhalation Q6H PRN Lamar Sprinkles, MD      . aspirin chewable tablet 81 mg  81 mg Oral Daily Belva Crome III, MD   81 mg at 04/28/14 8676  . atorvastatin (LIPITOR) tablet 80 mg  80 mg Oral q1800 Lamar Sprinkles, MD   80 mg at 04/28/14 1720  . clopidogrel (PLAVIX) tablet 75 mg  75 mg Oral Q breakfast Belva Crome III, MD   75 mg at 04/28/14 1950  . docusate sodium (COLACE) capsule 100 mg  100 mg Oral Daily Lamar Sprinkles, MD   100 mg at 04/27/14 1607  . doxazosin (CARDURA) tablet 4 mg  4 mg Oral Daily Lamar Sprinkles, MD   4 mg at 04/28/14 1214  . furosemide (LASIX) tablet 80 mg  80 mg Oral BID Lamar Sprinkles, MD   80 mg at 04/28/14 1720  . insulin aspart (novoLOG) injection 0-9 Units  0-9 Units Subcutaneous QID Alwyn Pea, MD   1 Units at 04/27/14 1810  . insulin aspart (novoLOG) injection 2 Units  2 Units Subcutaneous QAC supper Lamar Sprinkles, MD   2 Units at 04/25/14 1713  . insulin aspart (novoLOG) injection 4 Units  4 Units Subcutaneous QAC breakfast Jeronimo Norma, [facility]   4 Units at 04/26/14 [phone]  . insulin NPH Human (HUMULIN N,NOVOLIN N) injection 22 Units  22 Units Subcutaneous QAC breakfast Jeronimo Norma, RPH   22 Units at 04/28/14 1242  . isosorbide mononitrate (IMDUR) 24 hr tablet 60 mg  60 mg Oral Daily Lamar Sprinkles, MD   60 mg at 04/28/14 0939  . lipase/protease/amylase (CREON-12/PANCREASE) capsule 2 capsule  2 capsule Oral TID WC Lamar Sprinkles, MD   2 capsule at 04/28/14 1633  . metoprolol tartrate (LOPRESSOR) tablet 75 mg  75 mg Oral BID Lamar Sprinkles, MD   75 mg at 04/28/14 2147  . mometasone-formoterol (DULERA) 100-5 MCG/ACT inhaler 2 puff  2 puff Inhalation BID Lamar Sprinkles, MD   2 puff at 04/28/14 [phone]  . multivitamin with minerals tablet 1 tablet  1 tablet Oral Daily Lamar Sprinkles, MD   1 tablet at 04/28/14 1215  . nitroGLYCERIN (NITROSTAT) SL tablet 0.4 mg  0.4 mg Sublingual Q5 Min x 3 PRN Lamar Sprinkles, MD   0.4 mg at 04/23/14 1555  . pantoprazole (PROTONIX) EC tablet 80 mg  80 mg Oral Daily Lamar Sprinkles, MD   80 mg at 04/28/14 1215  . paricalcitol (ZEMPLAR) capsule 1 mcg  1 mcg Oral QODAY Lamar Sprinkles, MD   1 mcg at 04/25/14 1004  . sevelamer carbonate (RENVELA) tablet 800 mg  800 mg Oral TID WC Lucrezia Starch, MD   800 mg at 04/28/14 1633  .  tiotropium (SPIRIVA) inhalation capsule 18 mcg  18 mcg Inhalation Daily Lamar Sprinkles, MD   18 mcg at 04/28/14 0748  . traMADol (ULTRAM) tablet 50 mg  50 mg Oral Q6H PRN Lamar Sprinkles, MD   50 mg at 04/25/14 2125    PE: General appearance: alert, cooperative and no distress Lungs: clear to auscultation bilaterally Heart: regular rate and rhythm and 2/6 apical MM Abdomen: soft, non-tender; bowel sounds normal; no masses,  no organomegaly Extremities: No LEE Pulses: 2+ and symmetric Skin: Wam anr dry, Right groin is soft, nontender, with no bruit Neurologic: Grossly normal  Lab Results:   Recent Labs  04/27/14 0512 04/28/14 0329 04/29/14 0400  WBC 12.2* 14.6* 10.7*  HGB 10.0* 9.2* 7.8*  HCT 31.1* 28.8* 23.9*  PLT 288 258 209   BMET  Recent Labs  04/27/14 0512 04/28/14 0329 04/29/14 0400  NA  136* 138  137 136*  K 3.8 4.1  4.0 3.8  CL 97 98  98 100  CO2 23 21  20 19   GLUCOSE 75 92  94 122*  BUN 73* 75*  76* 81*  CREATININE 5.88* 5.89*  5.98* 6.21*  CALCIUM 9.7 9.7  9.8 9.1    Assessment/Plan   Active Problems: NSTEMI.  Cath showed severe 3 vessel ASCAD with patent LIMA to LAD, patent SVG to diag and SVG to OM, occluded SVG to ramus, moderate AS and normal right heart and LV filling pressures. He has a 70% stenosis in the ostium of an ungrafted RCA and 90% mid RCA. On, 6/19, had a rotation atherectomy to the RCA with DES. Dr. Tamala Julian wants him to stay for 48 hours post cath.   EF 55-60%, hypokinesis of the basalinferolateral myocardium, grade one diastolic dysf.   Anemia, acute Hgb has dropped from 9.2 to 7.8 overnight.  No obvious signs of bleeding.  Right groin is soft, nontender, with no bruit. Will check stool guaiacs.  Given his rapid drop, recommend transfusing two units PRBCs.  Moderate aortic stenosis by cath: No change in therapy.   CKD, stage 5: Creat stable 5.89>>6.21.  Nephrology following.  The patient pulled out his temporary HD cath overnight.  Hyperlipidemia: Continue statin therapy.   Type 2 diabetes mellitus. Stable on insulin  Hypertension: Elevated some this morning but fairly well controlled.     Urologic:  Difficulty with catheter. Urology had to reinsert last night. Culture read out >100,000 colonies of mixed bacteria. Renal suggested no treatment unless febrile. The patient has had no fevers.   Nonsustained atrial tachycardia: he had more last night.  NSR currently.  On lopressor 75 BID.      LOS: 8 days    HAGER, BRYAN PA-C 04/29/2014 7:45 AM  Cardiac status is stable  Nephrology to see regarding need to reinsert temporary catheter Bacteruria ? Rx if another temporary line goes in   Will transfuse 2 units with 160mg  of lasix In between  Discussed need to keep in hospital until at least tomorrow.  Consider transfer To medical  service if longer  [employer]

## 2014-04-29 NOTE — Progress Notes (Signed)
In to check on pt for Vital signs. Pt asleep and keeps pulling at telemetry monitor. VSS. Blood sugar-115 Reminded pt not to pull on leads of telemetry. Rt IJ catheter dressing reinforced with tape. Reminded pt to  Leave IJ catheter alone. Bed alarm on. Door and curtain open to view pt. Pt back to sleep.

## 2014-04-29 NOTE — Progress Notes (Signed)
Pt sleeping at time I arrived to give inhalers. Per spouse, "pt has inhalers and doesn't really need what I have". I explained to her the ones i was scheduled to administer are different than what he has lying out on table. She requested I check back in a few hours. RT will continue to monitor.

## 2014-04-29 NOTE — Progress Notes (Signed)
Pt sleeping. Walked in to room and Rt IJ catheter in bed. Tip intact. No blood noted around site or on sheets or gown. Site noted without obvious hematoma. Dressing applied to site.

## 2014-04-29 NOTE — Progress Notes (Addendum)
Background 80 yo WM with a background of CAD, COPD, DM, HTN, advanced CKD 5 (creatinine in the 5's) (followed by Dr. Moshe [name] and has wanted to do PD as his outpt renal replacement option when reaching ESRD) who was admitted with STEMI. Had temp HD cath placed, underwent cath 6/17, then rotoblade atherectomy/PCI/stenting of RCA 6/19. Watching renal function post cath for dialysis indications. Pt inadvertently pulled out his IJ cath overnight on 6/20.    Assessment/Recommendation:   Stage 5 CKD with high risk for ESRD  Kidney function has worsened some past 24 hours Unfortunately he pulled out his IJ cath last PM (but no indication for acute HD) Hoping he does not get into pulm edema situation with PRBC's today for the acute drop in his Hb Further worsening in renal function will probably necessitate placement of tunnelled catheter and dialysis initiation. NSTEMI/CAD/Prior CABG - s/p cath 6/17; s/p PCI/atherectomy of RCA 6/19 Mod to severe AS - Not felt to be in need of intervention Anemia - Acute Hb variation to 7.8 (10 pre PCI) with cardiology plans for transfusion today of 2 unit. Uses procrit at home - was due on Saturday - wife to bring in vial so we can see outpt dose and we will order.   Chronic indwelling foley - was due for change; urology had to be called to replace 6/19 DM HTN HLD COPD Secondary hyperpara - on hectorol; added renvela for hyperphos Asymptomatic bacteruria with mix flora on culture - no rx unless fever; WBC up post cardiac procedure; recultured urine 6/20  Subjective Feeling poorly today Pulled out his IJ HD cath last night Hb down today to 7.8 To get PRBC's X 2 units (first unit just started)  Objective: Vital signs in last 24 hours: Temp:  [97.4 F (36.3 C)-98.2 F (36.8 C)] 98.2 F (36.8 C) (06/21 1126) Pulse Rate:  [76-95] 89 (06/21 1126) Resp:  [17-20] 20 (06/21 0529) BP: (107-150)/(50-72) 110/50 mmHg (06/21 1126) SpO2:  [95 %-99 %] 95 % (06/21  0529) Weight change:   Intake/Output from previous day: 06/20 0701 - 06/21 0700 In: 510 [P.O.:360; I.V.:150] Out: 1200 [Urine:1200]  WeightTrending:  04/28/14 0024 64.5 kg  04/26/14 0639 63.2 kg  04/24/14 1417 65.7 kg  04/23/14 0500 65.7 kg  04/22/14 0539 65.1 kg   BP 110/50  Pulse 89  Temp(Src) 98.2 F (36.8 C) (Oral)  Resp 20  Ht 5\' 6"  (1.676 m)  Wt 64.5 kg (142 lb 3.2 oz)  BMI 22.96 kg/m2  SpO2 95% NAD Dressing site of prior temp cath Resp: No crackles  Chest wall: old sternotomy scar Cardio: regular rate and rhythm, S1, S2 normal  Systolic murmur aortic area and at apex Extremities: No LE edema Some bruising groins  Clear urine foley Recent Labs  04/28/14 0329 04/29/14 0400  WBC 14.6* 10.7*  HGB 9.2* 7.8*  HCT 28.8* 23.9*  PLT 258 209    Recent Labs  04/28/14 0329 04/29/14 0400  NA 138  137 136*  K 4.1  4.0 3.8  CL 98  98 100  CO2 21  20 19   GLUCOSE 92  94 122*  BUN 75*  76* 81*  CREATININE 5.89*  5.98* 6.21*  CALCIUM 9.7  9.8 9.1    Results for Raymond, [name] (MRN [id number]) as of 04/28/2014 12:28  Ref. Range 04/27/2014 05:12  Iron Latest Range: 42-135 ug/dL 31 (L)  UIBC Latest Range: 125-400 ug/dL 99 (L)  TIBC Latest Range: 215-435 ug/dL 130 (L)  Saturation  Ratios Latest Range: 20-55 % 24  Ferritin Latest Range: 22-322 ng/mL 1365 (H)    Scheduled Medications . aspirin  81 mg Oral Daily  . atorvastatin  80 mg Oral q1800  . clopidogrel  75 mg Oral Q breakfast  . docusate sodium  100 mg Oral Daily  . doxazosin  4 mg Oral Daily  . furosemide  80 mg Intravenous Once  . furosemide  80 mg Oral BID  . insulin aspart  0-9 Units Subcutaneous QID  . insulin aspart  2 Units Subcutaneous QAC supper  . insulin aspart  4 Units Subcutaneous QAC breakfast  . insulin NPH Human  22 Units Subcutaneous QAC breakfast  . isosorbide mononitrate  60 mg Oral Daily  . lipase/protease/amylase  2 capsule Oral TID WC  . metoprolol  75 mg Oral BID  .  mometasone-formoterol  2 puff Inhalation BID  . multivitamin with minerals  1 tablet Oral Daily  . pantoprazole  80 mg Oral Daily  . paricalcitol  1 mcg Oral QODAY  . sevelamer carbonate  800 mg Oral TID WC  . tiotropium  18 mcg Inhalation Daily   . sodium chloride 50 mL/hr at 04/28/14 1633     LOS: 8 days   DUNHAM,CYNTHIA B 04/29/2014,11:52 AM

## 2014-04-29 NOTE — Progress Notes (Signed)
Pt pulled out temp hd cath in to rt neck, per pm nurse.site wnl. Monitoring will continue.

## 2014-04-30 ENCOUNTER — Other Ambulatory Visit: Payer: Self-pay | Admitting: Interventional Cardiology

## 2014-04-30 ENCOUNTER — Other Ambulatory Visit: Payer: Self-pay | Admitting: *Deleted

## 2014-04-30 LAB — TYPE AND SCREEN
ABO/RH(D): O POS
Antibody Screen: NEGATIVE
UNIT DIVISION: 0
Unit division: 0

## 2014-04-30 LAB — RENAL FUNCTION PANEL
Albumin: 2.6 g/dL — ABNORMAL LOW (ref 3.5–5.2)
BUN: 74 mg/dL — AB (ref 6–23)
CALCIUM: 9.3 mg/dL (ref 8.4–10.5)
CO2: 19 mEq/L (ref 19–32)
CREATININE: 6.22 mg/dL — AB (ref 0.50–1.35)
Chloride: 100 mEq/L (ref 96–112)
GFR calc Af Amer: 9 mL/min — ABNORMAL LOW (ref >90)
GFR calc non Af Amer: 7 mL/min — ABNORMAL LOW (ref >90)
GLUCOSE: 86 mg/dL (ref 70–99)
PHOSPHORUS: 4.3 mg/dL (ref 2.3–4.6)
Potassium: 3.5 mEq/L — ABNORMAL LOW (ref 3.7–5.3)
Sodium: 135 mEq/L — ABNORMAL LOW (ref 137–147)

## 2014-04-30 LAB — GLUCOSE, CAPILLARY
GLUCOSE-CAPILLARY: 100 mg/dL — AB (ref 70–99)
GLUCOSE-CAPILLARY: 89 mg/dL (ref 70–99)
Glucose-Capillary: 134 mg/dL — ABNORMAL HIGH (ref 70–99)
Glucose-Capillary: 170 mg/dL — ABNORMAL HIGH (ref 70–99)

## 2014-04-30 LAB — CBC
HCT: 31.1 % — ABNORMAL LOW (ref 39.0–52.0)
Hemoglobin: 10.4 g/dL — ABNORMAL LOW (ref 13.0–17.0)
MCH: 30.6 pg (ref 26.0–34.0)
MCHC: 33.4 g/dL (ref 30.0–36.0)
MCV: 91.5 fL (ref 78.0–100.0)
Platelets: 218 10*3/uL (ref 150–400)
RBC: 3.4 MIL/uL — ABNORMAL LOW (ref 4.22–5.81)
RDW: 16 % — AB (ref 11.5–15.5)
WBC: 16 10*3/uL — ABNORMAL HIGH (ref 4.0–10.5)

## 2014-04-30 MED ORDER — CLOPIDOGREL BISULFATE 75 MG PO TABS: 75.0000 mg | ORAL_TABLET | Freq: Every day | ORAL | Status: DC

## 2014-04-30 MED ORDER — SEVELAMER CARBONATE 800 MG PO TABS: 800.0000 mg | ORAL_TABLET | Freq: Three times a day (TID) | ORAL | Status: AC

## 2014-04-30 MED ORDER — GLUCOSE 40 % PO GEL: 1.0000 | ORAL | Status: DC | PRN

## 2014-04-30 MED ORDER — ATORVASTATIN CALCIUM 80 MG PO TABS: 80.0000 mg | ORAL_TABLET | Freq: Every day | ORAL | Status: DC

## 2014-04-30 MED FILL — Sodium Chloride IV Soln 0.9%: INTRAVENOUS | Qty: 50 | Status: AC

## 2014-04-30 NOTE — Progress Notes (Signed)
Patient D/C'd home with wife. VSS. D/C instructions reviewed with patient and his wife. IV and telemtry D/C'd. Patient transported to pickup area via wheelchair by volunteers.

## 2014-04-30 NOTE — Progress Notes (Signed)
Repeat accu ck. 134. R.N. Aware and will cont. To monitor accu cks.

## 2014-04-30 NOTE — Progress Notes (Addendum)
Subjective: [name] feels much better today - received transfusion yesterday  Walked yesterday without problems.   Objective: Vital signs in last 24 hours: Temp:  [96.9 F (36.1 C)-98.5 F (36.9 C)] 96.9 F (36.1 C) (06/22 0332) Pulse Rate:  [70-89] 75 (06/22 0332) Resp:  [18-22] 20 (06/22 0332) BP: (105-164)/(50-81) 137/54 mmHg (06/22 0332) SpO2:  [95 %-100 %] 95 % (06/22 0332) Weight:  [149 lb 11.1 oz (67.9 kg)] 149 lb 11.1 oz (67.9 kg) (06/22 0500) Last BM Date: 04/27/14  Intake/Output from previous day: 06/21 0701 - 06/22 0700 In: 1010.2 [P.O.:360; Blood:650.2] Out: 2150 [Urine:2150] Intake/Output this shift:    Medications Current Facility-Administered Medications  Medication Dose Route Frequency Provider Last Rate Last Dose  . 0.9 %  sodium chloride infusion   Intravenous Continuous Lucrezia Starch, MD 50 mL/hr at 04/28/14 1633    . albuterol (PROVENTIL) (2.5 MG/3ML) 0.083% nebulizer solution 2.5 mg  2.5 mg Inhalation Q6H PRN Lamar Sprinkles, MD      . aspirin chewable tablet 81 mg  81 mg Oral Daily Belva Crome III, MD   81 mg at 04/29/14 0944  . atorvastatin (LIPITOR) tablet 80 mg  80 mg Oral q1800 Lamar Sprinkles, MD   80 mg at 04/29/14 1650  . clopidogrel (PLAVIX) tablet 75 mg  75 mg Oral Q breakfast Belva Crome III, MD   75 mg at 04/30/14 0825  . dextrose (GLUTOSE) 40 % oral gel 37.5 g  1 Tube Oral PRN Belva Crome III, MD      . docusate sodium (COLACE) capsule 100 mg  100 mg Oral Daily Lamar Sprinkles, MD   100 mg at 04/29/14 0939  . doxazosin (CARDURA) tablet 4 mg  4 mg Oral Daily Lamar Sprinkles, MD   4 mg at 04/29/14 0941  . furosemide (LASIX) tablet 80 mg  80 mg Oral BID Lamar Sprinkles, MD   80 mg at 04/30/14 0825  . insulin aspart (novoLOG) injection 0-9 Units  0-9 Units Subcutaneous QID Alwyn Pea, MD   2 Units at 04/29/14 2135  . insulin aspart (novoLOG) injection 2 Units  2 Units Subcutaneous QAC supper Lamar Sprinkles, MD   2 Units  at 04/25/14 1713  . insulin aspart (novoLOG) injection 4 Units  4 Units Subcutaneous QAC breakfast Jeronimo Norma, [facility]   4 Units at 04/26/14 [phone]  . insulin NPH Human (HUMULIN N,NOVOLIN N) injection 22 Units  22 Units Subcutaneous QAC breakfast Jeronimo Norma, RPH   22 Units at 04/29/14 4010  . isosorbide mononitrate (IMDUR) 24 hr tablet 60 mg  60 mg Oral Daily Lamar Sprinkles, MD   60 mg at 04/29/14 0940  . lipase/protease/amylase (CREON-12/PANCREASE) capsule 2 capsule  2 capsule Oral TID WC Lamar Sprinkles, MD   2 capsule at 04/30/14 0825  . metoprolol tartrate (LOPRESSOR) tablet 75 mg  75 mg Oral BID Lamar Sprinkles, MD   75 mg at 04/29/14 2127  . mometasone-formoterol (DULERA) 100-5 MCG/ACT inhaler 2 puff  2 puff Inhalation BID Lamar Sprinkles, MD   2 puff at 04/29/14 2017  . multivitamin with minerals tablet 1 tablet  1 tablet Oral Daily Lamar Sprinkles, MD   1 tablet at 04/29/14 0940  . nitroGLYCERIN (NITROSTAT) SL tablet 0.4 mg  0.4 mg Sublingual Q5 Min x 3 PRN Lamar Sprinkles, MD   0.4 mg at 04/23/14 1555  . pantoprazole (PROTONIX) EC tablet 80 mg  80 mg Oral Daily Lamar Sprinkles, MD   80 mg  at 04/29/14 0941  . paricalcitol (ZEMPLAR) capsule 1 mcg  1 mcg Oral QODAY Lamar Sprinkles, MD   1 mcg at 04/29/14 [phone]  . sevelamer carbonate (RENVELA) tablet 800 mg  800 mg Oral TID WC Lucrezia Starch, MD   800 mg at 04/30/14 0825  . tiotropium (SPIRIVA) inhalation capsule 18 mcg  18 mcg Inhalation Daily Lamar Sprinkles, MD   18 mcg at 04/28/14 0748  . traMADol (ULTRAM) tablet 50 mg  50 mg Oral Q6H PRN Lamar Sprinkles, MD   50 mg at 04/25/14 2125    PE: General appearance: alert, cooperative and no distress Lungs: clear to auscultation bilaterally Heart: regular rate and rhythm and 2/6 apical MM Abdomen: soft, non-tender; bowel sounds normal; no masses,  no organomegaly Extremities: No LEE Pulses: 2+ and symmetric Skin: Wam anr dry, Right groin is soft, nontender, with no bruit Neurologic: Grossly  normal  Lab Results:   Recent Labs  04/28/14 0329 04/29/14 0400 04/30/14 0330  WBC 14.6* 10.7* 16.0*  HGB 9.2* 7.8* 10.4*  HCT 28.8* 23.9* 31.1*  PLT 258 209 218   BMET  Recent Labs  04/28/14 0329 04/29/14 0400 04/30/14 0330  NA 138  137 136* 135*  K 4.1  4.0 3.8 3.5*  CL 98  98 100 100  CO2 21  20 19 19   GLUCOSE 92  94 122* 86  BUN 75*  76* 81* 74*  CREATININE 5.89*  5.98* 6.21* 6.22*  CALCIUM 9.7  9.8 9.1 9.3    Assessment/Plan   Active Problems: NSTEMI.  Cath showed severe 3 vessel ASCAD with patent LIMA to LAD, patent SVG to diag and SVG to OM, occluded SVG to ramus, moderate AS and normal right heart and LV filling pressures. He has a 70% stenosis in the ostium of an ungrafted RCA and 90% mid RCA. On, 6/19, had a rotation atherectomy to the RCA with DES.   EF 55-60%, hypokinesis of the basalinferolateral myocardium, grade one diastolic dysf.   Anemia, acute Hgb has dropped from 9.2 to 7.8 overnight several days ago.Marland Kitchen  He was transfused.  HB is now > 10.  Feels better.  Moderate aortic stenosis by cath: No change in therapy.   CKD, stage 5: Creat stable 5.89>>6.21 > 6.22 today .  Nephrology following.  The patient pulled out his temporary HD cath 2 nights ago. Dr. Moshe Cipro has seen him and has arranged follow up for labs this week and office visit next week.     Hyperlipidemia: Continue statin therapy.   Type 2 diabetes mellitus. Stable on insulin  Hypertension: well controlled this am..  Urologic:  Difficulty with catheter. Urology had to reinsert last night. Culture read out >100,000 colonies of mixed bacteria. Renal suggested no treatment unless febrile. The patient has had no fevers.   WBC is up to 16 K today.   Will ask nephrology to help [place] with the decision to treat or not treat.  He has a chronic indwelling foley.      I think he could probably go home today if Dr. Moshe Cipro agrees.  He has not had any cardiology issues  recently.    Thayer Headings, Brooke Bonito., MD, [facility] 04/30/2014, 9:13 AM [address]  [place] Pager [phone]

## 2014-04-30 NOTE — Progress Notes (Signed)
Background 80 yo WM with CAD, COPD, DM, HTN, advanced CKD 5 (creatinine in the 5's) (followed by Dr. Moshe Cipro and has wanted to do PD as his outpt renal replacement option when reaching ESRD) who was admitted with STEMI. Had temp HD cath placed, underwent cath 6/17, then rotoblade atherectomy/PCI/stenting of RCA 6/19. Watching renal function post cath for dialysis indications. Pt inadvertently pulled out his IJ cath overnight on 6/20- never needed HD.    Assessment/Recommendation:   Stage 5 CKD with high risk for ESRD  Kidney function stable last 24 hours- he actually feels better today compared to yesterday- there are no dialysis indications Hoping he does not get into pulm edema situation with PRBC's today for the acute drop in his Hb NSTEMI/CAD/Prior CABG - s/p cath 6/17; s/p PCI/atherectomy of RCA 6/19 Mod to severe AS - Not felt to be in need of intervention Anemia - Acute Hb variation to 7.8 (10 pre PCI) with cardiology plans for transfusion improved.  Can re initiate procrit as OP Chronic indwelling foley - was due for change; urology had to be called to replace 6/19- Since foley was changed should take care of bacterial colonization previously- no antibiotics needed DM HTN HLD COPD Secondary hyperpara - on hectorol; added renvela for hyperphos Asymptomatic bacteruria with mix flora on culture - foley cath exchanged-  no rx unless fever; WBC up post cardiac procedure; recultured urine 6/20 Dispo- I am Ok with discharge- will get labs on Wednesday to follow up and come back to see me on July 1- will likely just proceed with PD catheter and training as an OP  Subjective Feeling better today- cards is considering discharge     Objective: Vital signs in last 24 hours: Temp:  [96.9 F (36.1 C)-98.5 F (36.9 C)] 96.9 F (36.1 C) (06/22 0332) Pulse Rate:  [70-89] 75 (06/22 0332) Resp:  [18-22] 20 (06/22 0332) BP: (105-164)/(50-81) 137/54 mmHg (06/22 0332) SpO2:  [95 %-100 %] 95  % (06/22 0332) Weight:  [67.9 kg (149 lb 11.1 oz)] 67.9 kg (149 lb 11.1 oz) (06/22 0500) Weight change:   Intake/Output from previous day: 06/21 0701 - 06/22 0700 In: 1010.2 [P.O.:360; Blood:650.2] Out: 2150 [Urine:2150]  WeightTrending:  04/28/14 0024 64.5 kg  04/26/14 0639 63.2 kg  04/24/14 1417 65.7 kg  04/23/14 0500 65.7 kg  04/22/14 0539 65.1 kg   BP 137/54  Pulse 75  Temp(Src) 96.9 F (36.1 C) (Axillary)  Resp 20  Ht 5\' 6"  (1.676 m)  Wt 67.9 kg (149 lb 11.1 oz)  BMI 24.17 kg/m2  SpO2 95% NAD Resp: No crackles  Chest wall: old sternotomy scar Cardio: regular rate and rhythm, S1, S2 normal  Systolic murmur aortic area and at apex Extremities: No LE edema Some bruising groins  Clear urine foley  Recent Labs  04/29/14 0400 04/30/14 0330  WBC 10.7* 16.0*  HGB 7.8* 10.4*  HCT 23.9* 31.1*  PLT 209 218    Recent Labs  04/29/14 0400 04/30/14 0330  NA 136* 135*  K 3.8 3.5*  CL 100 100  CO2 19 19  GLUCOSE 122* 86  BUN 81* 74*  CREATININE 6.21* 6.22*  CALCIUM 9.1 9.3    Results for TAVORIS, BRISK (MRN [id number]) as of 04/28/2014 12:28  Ref. Range 04/27/2014 05:12  Iron Latest Range: 42-135 ug/dL 31 (L)  UIBC Latest Range: 125-400 ug/dL 99 (L)  TIBC Latest Range: 215-435 ug/dL 130 (L)  Saturation Ratios Latest Range: 20-55 % 24  Ferritin Latest Range:  22-322 ng/mL 1365 (H)    Scheduled Medications . aspirin  81 mg Oral Daily  . atorvastatin  80 mg Oral q1800  . clopidogrel  75 mg Oral Q breakfast  . docusate sodium  100 mg Oral Daily  . doxazosin  4 mg Oral Daily  . furosemide  80 mg Oral BID  . insulin aspart  0-9 Units Subcutaneous QID  . insulin aspart  2 Units Subcutaneous QAC supper  . insulin aspart  4 Units Subcutaneous QAC breakfast  . insulin NPH Human  22 Units Subcutaneous QAC breakfast  . isosorbide mononitrate  60 mg Oral Daily  . lipase/protease/amylase  2 capsule Oral TID WC  . metoprolol  75 mg Oral BID  .  mometasone-formoterol  2 puff Inhalation BID  . multivitamin with minerals  1 tablet Oral Daily  . pantoprazole  80 mg Oral Daily  . paricalcitol  1 mcg Oral QODAY  . sevelamer carbonate  800 mg Oral TID WC  . tiotropium  18 mcg Inhalation Daily   . sodium chloride 50 mL/hr at 04/28/14 1633     LOS: 9 days   GOLDSBOROUGH,KELLIE A 04/30/2014,10:03 AM

## 2014-04-30 NOTE — Discharge Summary (Signed)
Discharge Summary   Patient ID: Raymond [name] MRN: [id number], DOB/AGE: Oct 08, 1930 80 y.o. Admit date: 04/21/2014 D/C date:     04/30/2014  Primary Cardiologist: Dr. Tamala Julian  Principal Problem:   NSTEMI (non-ST elevated myocardial infarction) Active Problems:   CAD (coronary artery disease)   HTN (hypertension)   COPD (chronic obstructive pulmonary disease) with emphysema   CKD (chronic kidney disease) stage 4, GFR 15-29 ml/min   Dyslipidemia   Obstructed Foley catheter   Diabetes Mellitus    Aortic stenosis, moderate  Discharge Diagnosis: NSTEMI s/p rotoblade atherectomy/DES to RCA on 6/19. Pk troponin 3.9.  HPI: Raymond [name] is a 80 y.o. male with a history of DM, HTN, HLD, CAD s/p CABG,  HFpEF, CKD V, and mild AS who presented to [facility] on 04/21/14 with chest pain and ECG changes.  The patient is followed by Dr. Tamala Julian as an outpatient. They have had previous discussions concerning further diagnostic testing for his CAD in the setting of severe CKD as it was thought that a cardiac cath would place him at high-risk for the need for hemodialysis.  On the day of admission the patient had chest pain for which he called EMS. By the time he arrived at the ED, he was chest pain free. ECG was noted to have new ST depressions and TWI in lateral leads. IV UFH was started and cardiology was consulted for admission.    Hospital Course: Due to his advanced CKD stage 5, nephrology was consulted and a discussion concerning cardiac catheterization was delayed until Dr. Tamala Julian could weigh in. Dr. Tamala Julian saw the patient on 6/16 and it was decided to proceed with aggressive management of his underlying cardiac issues. Hospice and palliative care were discussed and the patient was not interested in this approach. He understood the high risk and possibility that he may need repeat CABG/AVR for which he was not a candidate. It was decided to proceed with temporary dialysis access point placement prior  to coronary angiography in the case that he need HD immediately post angiography. The hemodialysis catheter was placed on 6/16.   NSTEMI. Initial troponin I 3.9.  -- He underwent his first cardiac catheterization on 6/17 which revealed severe 3 vessel ASCAD with patent LIMA to LAD, patent SVG to diag and SVG to OM, occluded SVG to ramus, moderate AS and normal right heart and LV filling pressures. He has a 70% stenosis in the ostium of an ungrafted RCA and 90% mid RCA. No interventions were done at that time. The films were reviewed with Dr. Tamala Julian and the plans were discussed with the patient, and it was decided to undergo repeat cardiac cath and PCI. He underwent his second cardiac cath on 6/19 and is now s/p rotation atherectomy to the RCA with DES placement.  -- Continue DAPT with ASA/Plavix -- He is not a candidate for CABG  Anemia- His Hgb  dropped from 9.2 to 7.8 overnight after second cardiac cath. He was transfused yesterday and HB is now > 10 and he is feeling better.  -- Can re initiate procrit as OP per nephrology.   Moderate aortic stenosis by cath and probable severe stenosis by ECHO.  -- No change in therapy.   CKD, stage 5: Creat stable 5.89>>6.21 > 6.22 today -- The patient inadvertently pulled out his temporary HD cath after cath; however, he did not end up needing it.  -- His is followed closely by nephrology as an outpatient. No need for HD  at present moment.  Hyperlipidemia: Continue statin therapy.   Type 2 diabetes mellitus. Stable.  Hypertension: well controlled  Chronic indwelling foley - was due for change; urology had to be called to replace 6/19- Since foley was changed should take care of bacterial colonization previously- no antibiotics needed  Asymptomatic bacteruria with mix flora on culture- culture read out >100,000 colonies of mixed bacteria. Renal suggested no treatment unless febrile. The patient has had no fevers.   Secondary hyperparathyroidism - on  hectorol; added renvela for hyperphos  Leukocytosis- WBC is up to 16 K today. This is felt to be stable and he can go home.   Chronic diastolic CHF- ECHO this admission with mild LVH with LVEF 55-60%, basal inferolateral hypokinesis noted, grade 1 diastolic dysfunction. Severe MAC with mild mitral regurgitation. Mild left atrial enlargement. Probable severe calcific aortic stenosis, somewhat discordant information as detailed above however. Trivial tricuspid regurgitation, unable to assess PASP -- Currently euvolemic- Discharge weight 149lbs.  -- Continue Lasix 80mg  BID  The patient has had a complicated hospital course, but he is recovering well and stable. The femoral catheter site is stable. He has been seen by Dr. Acie Fredrickson today and deemed ready for discharge home. All follow-up appointments have been scheduled. He will get labs on Wednesday and will see nephrology on May 09, 2014. Discharge medications are listed below. He will be sent home on ASA/plavix, doxazosin 4mg , Lasix 80mg  BID, imdur 60mg , lopressor 50mg  BID, SL NTG and Lipitor 80mg . He will go home with home health PT and RN.   Discharge Vitals: Blood pressure 137/54, pulse 75, temperature 96.9 F (36.1 C), temperature source Axillary, resp. rate 20, height 5\' 6"  (1.676 m), weight 149 lb 11.1 oz (67.9 kg), SpO2 95.00%.  Labs: Lab Results  Component Value Date   WBC 16.0* 04/30/2014   HGB 10.4* 04/30/2014   HCT 31.1* 04/30/2014   MCV 91.5 04/30/2014   PLT 218 04/30/2014     Recent Labs Lab 04/30/14 0330  NA 135*  K 3.5*  CL 100  CO2 19  BUN 74*  CREATININE 6.22*  CALCIUM 9.3  GLUCOSE 86    Lab Results  Component Value Date   CHOL 110 04/21/2014   HDL 40 04/21/2014   LDLCALC 57 04/21/2014   TRIG 63 04/21/2014     Diagnostic Studies/Procedures   Dg Chest 2 View  04/21/2014   CLINICAL DATA:  Sharp left chest pain, cough, COPD  EXAM: CHEST  2 VIEW  COMPARISON:  02/02/2014  FINDINGS: Chronic interstitial  markings/emphysematous changes. No focal consolidation. No pleural effusion or pneumothorax.  The heart is normal in size. Postsurgical changes related to prior CABG.  Degenerative changes of the visualized thoracolumbar spine.  IMPRESSION: No evidence of acute cardiopulmonary disease.    2D ECHO: 04/21/2014 LV EF: 55% - 60% ------------------------------------------------------------------ Study Conclusions - Left ventricle: The cavity size was normal. Wall thickness was increased in a pattern of mild LVH. Systolic function was normal. The estimated ejection fraction was in the range of 55% to 60%. There is hypokinesis of the basalinferolateral myocardium. Doppler parameters are consistent with abnormal left ventricular relaxation (grade 1 diastolic dysfunction). - Aortic valve: Moderately calcified annulus. Probably trileaflet; moderately calcified leaflets. Cusp separation was reduced. There was probable severe stenosis, although somewhat discordant information. Gradients are more consistent with moderate aortic stenosis, and the LVOT/AV VTI ratio is less than 25% more consistent with severe aortic stenosis. Mean gradient (S): 21 mm Hg. Peak gradient (S): 39 mm  Hg. - Mitral valve: Severely calcified annulus. There was mild regurgitation. - Left atrium: The atrium was mildly dilated. - Right atrium: The atrium was mildly dilated. Central venous pressure (est): 3 mm Hg. - Tricuspid valve: There was trivial regurgitation. - Pulmonary arteries: Systolic pressure could not be accurately estimated. - Pericardium, extracardiac: There was no pericardial effusion. Impressions: - Mild LVH with LVEF 55-60%, basal inferolateral hypokinesis noted, grade 1 diastolic dysfunction. Severe MAC with mild mitral regurgitation. Mild left atrial enlargement. Probable severe calcific aortic stenosis, somewhat discordant information as detailed above however. Trivial tricuspid regurgitation, unable to  assess PASP   Dg Chest Port 1 View  04/24/2014   CLINICAL DATA:  Status post placement dialysis catheter.  EXAM: PORTABLE CHEST - 1 VIEW  COMPARISON:  PA and lateral chest 04/21/2014.  FINDINGS: Right IJ approach central venous catheter is in place with the tips in the mid superior vena cava. There is no pneumothorax. The lungs appear emphysematous. The patient is status post CABG. Heart size is upper normal.  IMPRESSION: Right IJ catheter tip projects over the mid superior vena cava. Negative for pneumothorax.  No acute cardiopulmonary disease.  Emphysema.     Cardiac Catheterization Procedure Note 04/25/14 Name: Raymond [name]  MRN: [id number]  DOB: 01-Mar-1930  Procedure: Right Heart Cath, Left Heart Cath, Selective Coronary Angiography, SVG and LIMA angiography  Indication: 80 yo WM with history of remote CABG in 1997 presents with a NSTEMI. Echo shows evidence of moderate aortic stenosis.  Procedural Details: The right groin was prepped, draped, and anesthetized with 1% lidocaine. Using the modified Seldinger technique a 5 French sheath was placed in the right femoral artery and a 7 French sheath was placed in the right femoral vein. A Swan-Ganz catheter was used for the right heart catheterization. Standard protocol was followed for recording of right heart pressures and sampling of oxygen saturations. Fick cardiac output was calculated. Standard Judkins catheters were used for selective coronary angiography and left ventricular pressures. There were no immediate procedural complications. The patient was transferred to the post catheterization recovery area for further monitoring.  Procedural Findings:  Hemodynamics  RA 6/6 mean 2 mm Hg  RV 28/1 mm Hg  PA 31/10 mean 17 mm Hg  PCWP 8/7 mean 6 mm Hg  LV 152/6 mm Hg  AO 136/61 mean 86 mm Hg  AV mean gradient 15 mm Hg  Oxygen saturations:  PA 53%  AO 93%  Cardiac Output (Fick) 4.19 L/min  Cardiac Index (Fick) 2.39 L/min meter squared    Coronary angiography:  Coronary dominance: right  Left mainstem: Distal 50-70% stenosis.  Left anterior descending (LAD): 100% occluded proximally.  Ramus intermediate: small and diffusely disease to 60%  Left circumflex (LCx): 100% occlusion proximally.  Right coronary artery (RCA): This is a large dominant vessel. It is heavily calcified. There is an eccentric 70% stenosis at the ostium. The mid vessel has a complex 90% stenosis.  SVG to the diagonal is widely patent  SVG to the intermediate is occluded.  SVG to the OM is widely patent.  LIMA to the LAD is patent. This was difficult to engage but flush shots were adequate.  Left ventriculography: Not done.  Final Conclusions:  1. Severe 3 vessel obstructive CAD  2. Patent LIMA to the LAD  3. Patent SVG to the diagonal  4. Patent SVG to OM  5. Occluded SVG to the ramus intermediate.  6. Normal right heart and LV filling pressures.  7. Moderate  aortic stenosis.  Recommendations: Will reivew films with Dr. Tamala Julian. Consider complex PCI of the RCA which was not previously grafted versus continued medical therapy.   PERCUTANEOUS CORONARY INTERVENTION  Raymond [name] is a 80 y.o. male  INDICATION: NSTEMI and severe diffuse CAD in RCA with bypass graft failure  PROCEDURE: 1. Rotational atherectomy RCA; 2. Drug-eluting stent implantation; 3. Temporary transvenous pacemaker  CONSENT:  The risks, benefits, and details of the procedure were explained to the patient. Risks including death, MI, stroke, bleeding, limb ischemia, emergency CABG, renal failure and allergy were described and accepted by the patient. Informed written consent was obtained prior to proceeding.  PROCEDURE TECHNIQUE: After Xylocaine anesthesia a 7 French sheath was placed in the left femoral artery and a 6 French sheath in the left femoral vein both with with a single anterior needle wall sticks. Coronary guiding shots were made using a 7 [place] catheter. Antithrombotic therapy, Angiomax bolus and infusion, was begun and determined to be therapeutic by ACT. Antiplatelet therapy, 600 mg of Plavix, was loaded.  We placed a BMW wire distal to the stenosis and used an over the wire 1.5 balloon. We then exchanged for the Roto-Floppy 300 cm wire. We then loaded a 1.5 mm burr and performed 5 runs initially starting in the ostium but concentrating on the mid vessel. We then removed the 1.5 mm burr and loaded a 2.0 mm device. The case was complicated by ostial disease that led to pressure damping and reduce blood pressure when engaged in the ostium of the right coronary. Heart initial attempt to position the 2.0 mm burr in the ostium led to the equipment on calling out of the coronary. We then had to start over by repositioning the Roto floppy wire distally. We were then able to advance the 2.0 burr to the ostium and do one 30 second run.  A 3.0 x 15 balloon was used to to predilate the mid RCA at the ostium. We then placed a BMW buddy wire, positioned and deployed a 3.0 x 28 mm long Xience Alpine DES at 14 atmospheres. Two inflations were performed. The buddy wire was removed prior to deployment of the stent. The buddy wire was reinserted and a 3.5 x 15 [place] Emerge was used to post-dilate to 12 atmospheres in overlapping fashion throughout the stent. 35 balloon was also used to further dilate the ostium to 18 atmospheres. We removed the buddy wire and then positioned and deployed a 3.5 x 12 mm Xience Alpine to 14 atmospheres in the ostium of the right coronary. 3 balloon inflations were performed. We then positioned and post dilated using a 4.0 x 8 mm [place] Emerge to 14 atmospheres in overlapping fashion.  The patient tolerated the procedure although he was quite uncomfortable.  CONTRAST: Total of 206 cc.  COMPLICATIONS: Hypotension during engagement of the native right coronary.  ANGIOGRAPHIC RESULTS: 80% ostial and tandem 90-95% mid calcified stenoses were  reduced to 0% and 0% with TIMI grade 3 flow following rotational atherectomy in the  patient.  IMPRESSIONS: Successful complicated rotational atherectomy and drug-eluting stent implantation into the RCA ostium and a segmental highly diseased mid vessel reducing 80% ostial and tandem 90-95% mid stenoses to 0% with TIMI grade 3 flow.  RECOMMENDATION: Aspirin and Plavix indefinitely.  Track renal function as contrast load was quite heavy.  Should not be discharged from the hospital until these 48 hours out from the procedure with reference to following renal  function and the possible need for dialysis.Marland Kitchen      Discharge Medications     Medication List    STOP taking these medications       simvastatin 10 MG tablet  Commonly known as:  ZOCOR      TAKE these medications       albuterol 108 (90 BASE) MCG/ACT inhaler  Commonly known as:  PROVENTIL HFA;VENTOLIN HFA  - Inhale 2 puffs into the lungs every 6 (six) hours as needed. For wheeze or shortness of breath  -      aspirin EC 81 MG tablet  Take 81 mg by mouth daily.     atorvastatin 80 MG tablet  Commonly known as:  LIPITOR  Take 1 tablet (80 mg total) by mouth daily at 6 PM.     clopidogrel 75 MG tablet  Commonly known as:  PLAVIX  Take 1 tablet (75 mg total) by mouth daily with breakfast.     docusate sodium 100 MG capsule  Commonly known as:  COLACE  Take 100 mg by mouth daily.     doxazosin 4 MG tablet  Commonly known as:  CARDURA  Take 4 mg by mouth daily.     epoetin alfa 10000 UNIT/ML injection  Commonly known as:  EPOGEN,PROCRIT  10,000 Units every 14 (fourteen) days.     Fluticasone-Salmeterol 100-50 MCG/DOSE Aepb  Commonly known as:  ADVAIR  Inhale 1 puff into the lungs every 12 (twelve) hours.     furosemide 80 MG tablet  Commonly known as:  LASIX  Take 80 mg by mouth 2 (two) times daily.     insulin NPH Human 100 UNIT/ML injection  Commonly known as:  HUMULIN N,NOVOLIN N  Sliding scale with each  meal     insulin regular 100 units/mL injection  Commonly known as:  NOVOLIN R,HUMULIN R  Sliding scale with each meal     isosorbide mononitrate 60 MG 24 hr tablet  Commonly known as:  IMDUR  TAKE 1 TABLET (60 MG TOTAL) BY MOUTH DAILY.     lipase/protease/amylase 12000 UNITS Cpep capsule  Commonly known as:  CREON-12/PANCREASE  2 capsules 3 (three) times daily with meals. 2 before meals (6 daily)     metoprolol 50 MG tablet  Commonly known as:  LOPRESSOR  Take 50 mg by mouth 2 (two) times daily.     multivitamins ther. w/minerals Tabs tablet  Take 1 tablet by mouth daily.     nitroGLYCERIN 0.4 MG SL tablet  Commonly known as:  NITROSTAT  Place 1 tablet (0.4 mg total) under the tongue every 5 (five) minutes as needed for chest pain.     omeprazole 20 MG capsule  Commonly known as:  PRILOSEC  Take 20 mg by mouth daily.     paricalcitol 1 MCG capsule  Commonly known as:  ZEMPLAR  Take 1 mcg by mouth every other day.     quiNINE 324 MG capsule  Commonly known as:  QUALAQUIN  2 caps by mouth every evening     sevelamer carbonate 800 MG tablet  Commonly known as:  RENVELA  Take 1 tablet (800 mg total) by mouth 3 (three) times daily with meals.     tiotropium 18 MCG inhalation capsule  Commonly known as:  SPIRIVA  Place 18 mcg into inhaler and inhale daily.     traMADol 50 MG tablet  Commonly known as:  ULTRAM  Take 50 mg by mouth every 6 (six) hours as needed  for moderate pain. Maximum dose= 8 tablets per day        Disposition   The patient will be discharged in stable condition to home.  Follow-up Information   Follow up with Richardson Dopp, PA-C On 05/25/2014. (11:30 am )    Specialty:  Physician Assistant   Contact information:   [address] [address] [phone]         Duration of Discharge Encounter: Greater than 30 minutes including physician and PA time.  SignedPerry Mount PA-C 04/30/2014, 11:45  AM  Attending Note:   The patient was seen and examined.  Agree with assessment and plan as noted above.  Changes made to the above note as needed.  Pt is doing well.  Will need close follow up from renal.  Doing well from a cardiac standpoint.   Thayer Headings, Brooke Bonito., MD, [facility] 04/30/2014, 3:17 PM

## 2014-04-30 NOTE — Discharge Instructions (Signed)

## 2014-04-30 NOTE — Progress Notes (Signed)
CARDIAC REHAB PHASE I   PRE:  Rate/Rhythm: 93 SR with PACs    BP: sitting 150/70    SaO2: 93 RA  MODE:  Ambulation: 170 ft   POST:  Rate/Rhythm: 104 ST with PACs    BP: sitting 144/76     SaO2: 92 RA  Pt able to get to EOB and stand with min assist. Used RW and good control/mechanics. Fatigues easily, some SOB, although pt denied this. Sts he is just tired and ready to return to room. To recliner. Discussed MI and NTG. Encouraged RW and HHRN and HHPT. Pt declined RW as he sts he uses his cane and that there would not be room for a RW in his house. Does agree to [facility] and HHPT. Pt would benefit from inpatient PT as he has not walked with a cane and this is all he wants for home. Wife works full time and plans to return to work Wednesday.  [phone]  Josephina Shih [place] CES, ACSM 04/30/2014 11:03 AM

## 2014-04-30 NOTE — Progress Notes (Signed)
Accu ck. 52. Skin warm and dry.R.N. Aware of tonight events. Will give glucose Gel and patient also drank 4oz of grape juice. Will reck accu ck.

## 2014-04-30 NOTE — Progress Notes (Signed)
Was told by N.T. accu ck was 179 . Patient was given 2 units novolog insulin S.Q. . Patient ate snack peaches. It really was 85 done at 21:11. Ii was not in computer when I ask for results.. Will monitor accu. cks. closely tonight.

## 2014-04-30 NOTE — Progress Notes (Signed)
Accu ck. 100

## 2014-05-01 ENCOUNTER — Encounter: Payer: Self-pay | Admitting: Interventional Cardiology

## 2014-05-01 ENCOUNTER — Encounter: Payer: Self-pay | Admitting: Endocrinology

## 2014-05-01 LAB — URINE CULTURE: Colony Count: >=100000

## 2014-05-02 ENCOUNTER — Telehealth: Payer: Self-pay

## 2014-05-02 NOTE — Telephone Encounter (Signed)
called pt in resopnse to mychart medication clarification question. pt wife was at work. pt not sure which medications he is taking. pt sts that his wife gets of at 12pm, adv pt I will call back this afternoon.pt verbalized understanding.

## 2014-05-03 NOTE — Telephone Encounter (Signed)
received call form Freda Munro from [facility]. she needed verification on pt medications.confirmed simvastatin to be d/c and pt to start atorvastatin 80mg .she sts that cardura was listed on pt discharge summary, but has not taken it for over a year since 02/2013. Reviewed notes in [facility] could not locate documentation to discontinue, adv her to f/u with pt urologist to determine if pt should resume.she verbalized understanding.

## 2014-05-06 ENCOUNTER — Encounter: Payer: Self-pay | Admitting: Interventional Cardiology

## 2014-05-09 ENCOUNTER — Telehealth: Payer: Self-pay | Admitting: Interventional Cardiology

## 2014-05-09 MED ORDER — METOPROLOL TARTRATE 50 MG PO TABS: 25.0000 mg | ORAL_TABLET | Freq: Two times a day (BID) | ORAL | Status: DC

## 2014-05-09 NOTE — Telephone Encounter (Signed)
returned call to [facility].lmtcb

## 2014-05-09 NOTE — Telephone Encounter (Signed)
pt home [facility] called  back. she sts that pt resting heartrate today was 43bpm pt bp was 132/68. She sts thst on her previous visit it was 58bpm she sts that pt sts that he is fatigued, no other cardiac complaints.adv her that Dr.Smith is not back in the office until 7/6 I will talk with another physician in the office and call her back.  She was agreeable and verbalized understanding

## 2014-05-09 NOTE — Telephone Encounter (Signed)
Beth @ [facility].adv I spoke with Dr.Nelson another physician in the office, her recommnedtions are for pt to reduce Metoprolol to 25mg  bid and recheck heartrate in a couple of days.she verbalized understanding

## 2014-05-09 NOTE — Telephone Encounter (Signed)
New Message:  Raymond [name] states the pt's heart rate is low... States it is 43 and irregular rythem.Marland Kitchen

## 2014-05-10 NOTE — Telephone Encounter (Signed)
returned Beth call from [facility]. pt aware of medication change.reduce Metoprolol to 25mg  bid.spoke with pt, pt confirmed change

## 2014-05-10 NOTE — Telephone Encounter (Signed)
Follow up     Home health calling unable to reach patient spouse regarding medication.  Caregiver is deaf.

## 2014-05-14 MED ORDER — METOPROLOL TARTRATE 25 MG PO TABS: 12.5000 mg | ORAL_TABLET | Freq: Two times a day (BID) | ORAL | Status: DC

## 2014-05-14 NOTE — Telephone Encounter (Signed)
F/u    Beth saw pt today and his heart rate is still 42, bp 120-150's / 60's-80's. Pt still tired, weak and has a cough that nothing comes up. Please advise

## 2014-05-14 NOTE — Telephone Encounter (Signed)
Raymond [name] @ [facility] given Dr.Smith instructions reduce Metoptolol to 25mg  daily complete supply at home.new Rx sent to pharmacy for Metoprolol 12.5mg  bid.pt wife/caregiver is deaf , [name] will give her an update.Raymond [name] verbalized understanding.

## 2014-05-16 ENCOUNTER — Inpatient Hospital Stay (HOSPITAL_COMMUNITY)
Admission: EM | Admit: 2014-05-16 | Discharge: 2014-05-23 | DRG: 291 | Disposition: A | Discharge status: Hospice | Payer: Managed Care, Other (non HMO) | Attending: Family Medicine | Admitting: Family Medicine

## 2014-05-16 ENCOUNTER — Encounter (HOSPITAL_COMMUNITY): Payer: Self-pay | Admitting: Emergency Medicine

## 2014-05-16 ENCOUNTER — Emergency Department (HOSPITAL_COMMUNITY): Payer: Managed Care, Other (non HMO)

## 2014-05-16 DIAGNOSIS — R1310 Dysphagia, unspecified: Secondary | ICD-10-CM | POA: Diagnosis present

## 2014-05-16 DIAGNOSIS — D638 Anemia in other chronic diseases classified elsewhere: Secondary | ICD-10-CM | POA: Diagnosis present

## 2014-05-16 DIAGNOSIS — I350 Nonrheumatic aortic (valve) stenosis: Secondary | ICD-10-CM

## 2014-05-16 DIAGNOSIS — I700 Atherosclerosis of aorta: Secondary | ICD-10-CM | POA: Diagnosis present

## 2014-05-16 DIAGNOSIS — Z66 Do not resuscitate: Secondary | ICD-10-CM | POA: Diagnosis present

## 2014-05-16 DIAGNOSIS — E1165 Type 2 diabetes mellitus with hyperglycemia: Secondary | ICD-10-CM

## 2014-05-16 DIAGNOSIS — Z885 Allergy status to narcotic agent status: Secondary | ICD-10-CM | POA: Diagnosis not present

## 2014-05-16 DIAGNOSIS — R259 Unspecified abnormal involuntary movements: Secondary | ICD-10-CM | POA: Diagnosis present

## 2014-05-16 DIAGNOSIS — I498 Other specified cardiac arrhythmias: Secondary | ICD-10-CM | POA: Diagnosis present

## 2014-05-16 DIAGNOSIS — Z9861 Coronary angioplasty status: Secondary | ICD-10-CM | POA: Diagnosis not present

## 2014-05-16 DIAGNOSIS — R918 Other nonspecific abnormal finding of lung field: Secondary | ICD-10-CM

## 2014-05-16 DIAGNOSIS — E1069 Type 1 diabetes mellitus with other specified complication: Secondary | ICD-10-CM | POA: Diagnosis present

## 2014-05-16 DIAGNOSIS — Z7982 Long term (current) use of aspirin: Secondary | ICD-10-CM | POA: Diagnosis not present

## 2014-05-16 DIAGNOSIS — Z7902 Long term (current) use of antithrombotics/antiplatelets: Secondary | ICD-10-CM | POA: Diagnosis not present

## 2014-05-16 DIAGNOSIS — M129 Arthropathy, unspecified: Secondary | ICD-10-CM | POA: Diagnosis present

## 2014-05-16 DIAGNOSIS — I442 Atrioventricular block, complete: Secondary | ICD-10-CM | POA: Diagnosis present

## 2014-05-16 DIAGNOSIS — I2510 Atherosclerotic heart disease of native coronary artery without angina pectoris: Secondary | ICD-10-CM

## 2014-05-16 DIAGNOSIS — N179 Acute kidney failure, unspecified: Secondary | ICD-10-CM

## 2014-05-16 DIAGNOSIS — Z85038 Personal history of other malignant neoplasm of large intestine: Secondary | ICD-10-CM | POA: Diagnosis not present

## 2014-05-16 DIAGNOSIS — Z515 Encounter for palliative care: Secondary | ICD-10-CM

## 2014-05-16 DIAGNOSIS — R0600 Dyspnea, unspecified: Secondary | ICD-10-CM

## 2014-05-16 DIAGNOSIS — J9600 Acute respiratory failure, unspecified whether with hypoxia or hypercapnia: Secondary | ICD-10-CM | POA: Diagnosis present

## 2014-05-16 DIAGNOSIS — E785 Hyperlipidemia, unspecified: Secondary | ICD-10-CM

## 2014-05-16 DIAGNOSIS — K5900 Constipation, unspecified: Secondary | ICD-10-CM | POA: Diagnosis present

## 2014-05-16 DIAGNOSIS — R5381 Other malaise: Secondary | ICD-10-CM | POA: Diagnosis present

## 2014-05-16 DIAGNOSIS — J189 Pneumonia, unspecified organism: Secondary | ICD-10-CM | POA: Diagnosis present

## 2014-05-16 DIAGNOSIS — I120 Hypertensive chronic kidney disease with stage 5 chronic kidney disease or end stage renal disease: Secondary | ICD-10-CM | POA: Diagnosis present

## 2014-05-16 DIAGNOSIS — J438 Other emphysema: Secondary | ICD-10-CM | POA: Diagnosis present

## 2014-05-16 DIAGNOSIS — I5033 Acute on chronic diastolic (congestive) heart failure: Principal | ICD-10-CM | POA: Diagnosis present

## 2014-05-16 DIAGNOSIS — E7800 Pure hypercholesterolemia, unspecified: Secondary | ICD-10-CM | POA: Diagnosis present

## 2014-05-16 DIAGNOSIS — I5032 Chronic diastolic (congestive) heart failure: Secondary | ICD-10-CM | POA: Diagnosis present

## 2014-05-16 DIAGNOSIS — I3481 Nonrheumatic mitral (valve) annulus calcification: Secondary | ICD-10-CM

## 2014-05-16 DIAGNOSIS — J449 Chronic obstructive pulmonary disease, unspecified: Secondary | ICD-10-CM | POA: Diagnosis present

## 2014-05-16 DIAGNOSIS — I059 Rheumatic mitral valve disease, unspecified: Secondary | ICD-10-CM

## 2014-05-16 DIAGNOSIS — N390 Urinary tract infection, site not specified: Secondary | ICD-10-CM | POA: Diagnosis present

## 2014-05-16 DIAGNOSIS — Z951 Presence of aortocoronary bypass graft: Secondary | ICD-10-CM

## 2014-05-16 DIAGNOSIS — Z87891 Personal history of nicotine dependence: Secondary | ICD-10-CM

## 2014-05-16 DIAGNOSIS — R001 Bradycardia, unspecified: Secondary | ICD-10-CM | POA: Diagnosis present

## 2014-05-16 DIAGNOSIS — I359 Nonrheumatic aortic valve disorder, unspecified: Secondary | ICD-10-CM

## 2014-05-16 DIAGNOSIS — Z888 Allergy status to other drugs, medicaments and biological substances status: Secondary | ICD-10-CM | POA: Diagnosis not present

## 2014-05-16 DIAGNOSIS — R05 Cough: Secondary | ICD-10-CM

## 2014-05-16 DIAGNOSIS — R319 Hematuria, unspecified: Secondary | ICD-10-CM

## 2014-05-16 DIAGNOSIS — Z79899 Other long term (current) drug therapy: Secondary | ICD-10-CM | POA: Diagnosis not present

## 2014-05-16 DIAGNOSIS — I214 Non-ST elevation (NSTEMI) myocardial infarction: Secondary | ICD-10-CM

## 2014-05-16 DIAGNOSIS — I252 Old myocardial infarction: Secondary | ICD-10-CM

## 2014-05-16 DIAGNOSIS — Z794 Long term (current) use of insulin: Secondary | ICD-10-CM

## 2014-05-16 DIAGNOSIS — I4891 Unspecified atrial fibrillation: Secondary | ICD-10-CM | POA: Diagnosis present

## 2014-05-16 DIAGNOSIS — R0689 Other abnormalities of breathing: Secondary | ICD-10-CM

## 2014-05-16 DIAGNOSIS — IMO0001 Reserved for inherently not codable concepts without codable children: Secondary | ICD-10-CM

## 2014-05-16 DIAGNOSIS — N185 Chronic kidney disease, stage 5: Secondary | ICD-10-CM

## 2014-05-16 DIAGNOSIS — I509 Heart failure, unspecified: Secondary | ICD-10-CM | POA: Diagnosis present

## 2014-05-16 DIAGNOSIS — N184 Chronic kidney disease, stage 4 (severe): Secondary | ICD-10-CM

## 2014-05-16 DIAGNOSIS — Z7189 Other specified counseling: Secondary | ICD-10-CM

## 2014-05-16 DIAGNOSIS — I2584 Coronary atherosclerosis due to calcified coronary lesion: Secondary | ICD-10-CM

## 2014-05-16 DIAGNOSIS — J4489 Other specified chronic obstructive pulmonary disease: Secondary | ICD-10-CM | POA: Diagnosis present

## 2014-05-16 DIAGNOSIS — R059 Cough, unspecified: Secondary | ICD-10-CM

## 2014-05-16 DIAGNOSIS — I10 Essential (primary) hypertension: Secondary | ICD-10-CM

## 2014-05-16 DIAGNOSIS — I249 Acute ischemic heart disease, unspecified: Secondary | ICD-10-CM

## 2014-05-16 DIAGNOSIS — N189 Chronic kidney disease, unspecified: Secondary | ICD-10-CM

## 2014-05-16 HISTORY — DX: Pneumonia, unspecified organism: J18.9

## 2014-05-16 HISTORY — DX: Dependence on supplemental oxygen: Z99.81

## 2014-05-16 LAB — URINALYSIS, ROUTINE W REFLEX MICROSCOPIC
BILIRUBIN URINE: NEGATIVE
GLUCOSE, UA: NEGATIVE mg/dL
KETONES UR: NEGATIVE mg/dL
Nitrite: NEGATIVE
PH: 5 (ref 5.0–8.0)
PROTEIN: 100 mg/dL — AB
Specific Gravity, Urine: 1.018 (ref 1.005–1.030)
Urobilinogen, UA: 0.2 mg/dL (ref 0.0–1.0)

## 2014-05-16 LAB — BASIC METABOLIC PANEL
ANION GAP: 18 — AB (ref 5–15)
BUN: 73 mg/dL — ABNORMAL HIGH (ref 6–23)
CALCIUM: 9.8 mg/dL (ref 8.4–10.5)
CO2: 22 mEq/L (ref 19–32)
Chloride: 95 mEq/L — ABNORMAL LOW (ref 96–112)
Creatinine, Ser: 5.73 mg/dL — ABNORMAL HIGH (ref 0.50–1.35)
GFR, EST AFRICAN AMERICAN: 10 mL/min — AB (ref >90)
GFR, EST NON AFRICAN AMERICAN: 8 mL/min — AB (ref >90)
Glucose, Bld: 137 mg/dL — ABNORMAL HIGH (ref 70–99)
Potassium: 4.5 mEq/L (ref 3.7–5.3)
SODIUM: 135 meq/L — AB (ref 137–147)

## 2014-05-16 LAB — CBC WITH DIFFERENTIAL/PLATELET
BASOS ABS: 0 10*3/uL (ref 0.0–0.1)
Basophils Relative: 0 % (ref 0–1)
EOS PCT: 1 % (ref 0–5)
Eosinophils Absolute: 0.1 10*3/uL (ref 0.0–0.7)
HCT: 35.1 % — ABNORMAL LOW (ref 39.0–52.0)
Hemoglobin: 11.2 g/dL — ABNORMAL LOW (ref 13.0–17.0)
LYMPHS ABS: 1.2 10*3/uL (ref 0.7–4.0)
LYMPHS PCT: 6 % — AB (ref 12–46)
MCH: 30.8 pg (ref 26.0–34.0)
MCHC: 31.9 g/dL (ref 30.0–36.0)
MCV: 96.4 fL (ref 78.0–100.0)
Monocytes Absolute: 1.1 10*3/uL — ABNORMAL HIGH (ref 0.1–1.0)
Monocytes Relative: 5 % (ref 3–12)
NEUTROS ABS: 19.6 10*3/uL — AB (ref 1.7–7.7)
NEUTROS PCT: 88 % — AB (ref 43–77)
PLATELETS: 288 10*3/uL (ref 150–400)
RBC: 3.64 MIL/uL — ABNORMAL LOW (ref 4.22–5.81)
RDW: 15.6 % — AB (ref 11.5–15.5)
WBC: 22 10*3/uL — ABNORMAL HIGH (ref 4.0–10.5)

## 2014-05-16 LAB — I-STAT TROPONIN, ED: TROPONIN I, POC: 0.09 ng/mL — AB (ref 0.00–0.08)

## 2014-05-16 LAB — URINE MICROSCOPIC-ADD ON

## 2014-05-16 LAB — PRO B NATRIURETIC PEPTIDE: Pro B Natriuretic peptide (BNP): 21955 pg/mL — ABNORMAL HIGH (ref 0–450)

## 2014-05-16 LAB — TROPONIN I: Troponin I: <0.3 ng/mL (ref <0.30)

## 2014-05-16 LAB — GLUCOSE, CAPILLARY: Glucose-Capillary: 136 mg/dL — ABNORMAL HIGH (ref 70–99)

## 2014-05-16 LAB — CBG MONITORING, ED: GLUCOSE-CAPILLARY: 129 mg/dL — AB (ref 70–99)

## 2014-05-16 MED ORDER — ISOSORBIDE MONONITRATE ER 60 MG PO TB24
60.0000 mg | ORAL_TABLET | Freq: Every day | ORAL | Status: DC
  Administered 2014-05-17 – 2014-05-23 (×5): 60 mg via ORAL
  Filled 2014-05-16 (×7): qty 1

## 2014-05-16 MED ORDER — IPRATROPIUM-ALBUTEROL 0.5-2.5 (3) MG/3ML IN SOLN
3.0000 mL | Freq: Four times a day (QID) | RESPIRATORY_TRACT | Status: DC
  Administered 2014-05-16: 3 mL via RESPIRATORY_TRACT
  Filled 2014-05-16: qty 3

## 2014-05-16 MED ORDER — FUROSEMIDE 10 MG/ML IJ SOLN
80.0000 mg | Freq: Once | INTRAMUSCULAR | Status: AC
  Administered 2014-05-16: 80 mg via INTRAVENOUS
  Filled 2014-05-16: qty 8

## 2014-05-16 MED ORDER — ENOXAPARIN SODIUM 30 MG/0.3ML ~~LOC~~ SOLN
30.0000 mg | SUBCUTANEOUS | Status: DC
Start: 2014-05-16 — End: 2014-05-23
  Administered 2014-05-16 – 2014-05-22 (×7): 30 mg via SUBCUTANEOUS
  Filled 2014-05-16 (×8): qty 0.3

## 2014-05-16 MED ORDER — VANCOMYCIN HCL IN DEXTROSE 1-5 GM/200ML-% IV SOLN
1000.0000 mg | INTRAVENOUS | Status: DC
  Administered 2014-05-16 – 2014-05-19 (×2): 1000 mg via INTRAVENOUS
  Filled 2014-05-16 (×4): qty 200

## 2014-05-16 MED ORDER — INSULIN ASPART 100 UNIT/ML ~~LOC~~ SOLN
0.0000 [IU] | Freq: Three times a day (TID) | SUBCUTANEOUS | Status: DC
  Administered 2014-05-17 (×2): 1 [IU] via SUBCUTANEOUS
  Administered 2014-05-18 (×2): 2 [IU] via SUBCUTANEOUS
  Administered 2014-05-19 (×2): 1 [IU] via SUBCUTANEOUS
  Administered 2014-05-19: 3 [IU] via SUBCUTANEOUS
  Administered 2014-05-20: 2 [IU] via SUBCUTANEOUS
  Administered 2014-05-20: 1 [IU] via SUBCUTANEOUS
  Administered 2014-05-22 (×2): 3 [IU] via SUBCUTANEOUS
  Administered 2014-05-22: 9 [IU] via SUBCUTANEOUS

## 2014-05-16 MED ORDER — ACETAMINOPHEN 325 MG PO TABS: 650.0000 mg | ORAL_TABLET | Freq: Four times a day (QID) | ORAL | Status: DC | PRN

## 2014-05-16 MED ORDER — MOMETASONE FURO-FORMOTEROL FUM 100-5 MCG/ACT IN AERO
2.0000 | INHALATION_SPRAY | Freq: Two times a day (BID) | RESPIRATORY_TRACT | Status: DC
  Administered 2014-05-16 – 2014-05-23 (×12): 2 via RESPIRATORY_TRACT
  Filled 2014-05-16: qty 8.8

## 2014-05-16 MED ORDER — DOXAZOSIN MESYLATE 4 MG PO TABS
4.0000 mg | ORAL_TABLET | Freq: Every day | ORAL | Status: DC
  Administered 2014-05-17 – 2014-05-22 (×4): 4 mg via ORAL
  Filled 2014-05-16 (×7): qty 1

## 2014-05-16 MED ORDER — ONDANSETRON HCL 4 MG/2ML IJ SOLN: 4.0000 mg | Freq: Four times a day (QID) | INTRAMUSCULAR | Status: DC | PRN

## 2014-05-16 MED ORDER — ADULT MULTIVITAMIN W/MINERALS CH
1.0000 | ORAL_TABLET | Freq: Every day | ORAL | Status: DC
  Administered 2014-05-17: 1 via ORAL
  Filled 2014-05-16: qty 1

## 2014-05-16 MED ORDER — ONDANSETRON HCL 4 MG PO TABS: 4.0000 mg | ORAL_TABLET | Freq: Four times a day (QID) | ORAL | Status: DC | PRN

## 2014-05-16 MED ORDER — CLOPIDOGREL BISULFATE 75 MG PO TABS
75.0000 mg | ORAL_TABLET | Freq: Every day | ORAL | Status: DC
  Administered 2014-05-17 – 2014-05-22 (×5): 75 mg via ORAL
  Filled 2014-05-16 (×9): qty 1

## 2014-05-16 MED ORDER — PARICALCITOL 1 MCG PO CAPS
1.0000 ug | ORAL_CAPSULE | ORAL | Status: DC
  Administered 2014-05-17 – 2014-05-21 (×2): 1 ug via ORAL
  Filled 2014-05-16 (×3): qty 1

## 2014-05-16 MED ORDER — BIOTENE DRY MOUTH MT LIQD
15.0000 mL | Freq: Two times a day (BID) | OROMUCOSAL | Status: DC
  Administered 2014-05-16 – 2014-05-22 (×10): 15 mL via OROMUCOSAL

## 2014-05-16 MED ORDER — ATORVASTATIN CALCIUM 80 MG PO TABS
80.0000 mg | ORAL_TABLET | Freq: Every day | ORAL | Status: DC
  Administered 2014-05-16 – 2014-05-21 (×3): 80 mg via ORAL
  Filled 2014-05-16 (×8): qty 1

## 2014-05-16 MED ORDER — TRAMADOL HCL 50 MG PO TABS
50.0000 mg | ORAL_TABLET | Freq: Two times a day (BID) | ORAL | Status: DC | PRN
  Administered 2014-05-17: 50 mg via ORAL
  Filled 2014-05-16: qty 1

## 2014-05-16 MED ORDER — SODIUM CHLORIDE 0.9 % IJ SOLN
3.0000 mL | Freq: Two times a day (BID) | INTRAMUSCULAR | Status: DC
  Administered 2014-05-17 – 2014-05-22 (×9): 3 mL via INTRAVENOUS

## 2014-05-16 MED ORDER — SODIUM CHLORIDE 0.9 % IV BOLUS (SEPSIS)
500.0000 mL | Freq: Once | INTRAVENOUS | Status: AC
  Administered 2014-05-16: 500 mL via INTRAVENOUS

## 2014-05-16 MED ORDER — PANTOPRAZOLE SODIUM 40 MG PO TBEC
40.0000 mg | DELAYED_RELEASE_TABLET | Freq: Every day | ORAL | Status: DC
Start: 2014-05-17 — End: 2014-05-23
  Administered 2014-05-17 – 2014-05-22 (×4): 40 mg via ORAL
  Filled 2014-05-16 (×5): qty 1

## 2014-05-16 MED ORDER — CEFEPIME HCL 2 G IJ SOLR
500.0000 mg | INTRAMUSCULAR | Status: DC
  Administered 2014-05-16 – 2014-05-22 (×7): 500 mg via INTRAVENOUS
  Filled 2014-05-16 (×10): qty 0.5

## 2014-05-16 MED ORDER — ACETAMINOPHEN 650 MG RE SUPP: 650.0000 mg | Freq: Four times a day (QID) | RECTAL | Status: DC | PRN

## 2014-05-16 MED ORDER — SEVELAMER CARBONATE 800 MG PO TABS
800.0000 mg | ORAL_TABLET | Freq: Three times a day (TID) | ORAL | Status: DC
  Administered 2014-05-20 – 2014-05-22 (×5): 800 mg via ORAL
  Filled 2014-05-16 (×19): qty 1

## 2014-05-16 MED ORDER — ASPIRIN EC 81 MG PO TBEC
81.0000 mg | DELAYED_RELEASE_TABLET | Freq: Every day | ORAL | Status: DC
  Administered 2014-05-17 – 2014-05-22 (×4): 81 mg via ORAL
  Filled 2014-05-16 (×8): qty 1

## 2014-05-16 NOTE — Progress Notes (Signed)
Unit CM UR Completed by [facility] ED CM  W. Jyden Kromer RN  

## 2014-05-16 NOTE — ED Notes (Signed)
Phlebotomy at bedside drawing blood cultures.  

## 2014-05-16 NOTE — ED Provider Notes (Signed)
CSN: [id number]     Arrival date & time 05/16/14  1503 History   First MD Initiated Contact with Patient 05/16/14 1507     Chief Complaint  Patient presents with  . Shortness of Breath     (Consider location/radiation/quality/duration/timing/severity/associated sxs/prior Treatment) The history is provided by the patient and the spouse.  Dylen Mcelhannon Mcenery is a 80 y.o. male that presents to the ED for shortness of breath and altered mental status. Patient was at home with his home health nurse when he became less responsive. Blood glucose was 40 and he was given two glucagon shots. He returned to baseline after glucagon and was sent to the ED. His shortness of breath has an associated dry cough. No chest pain. Has a history of URI symptoms about 2 weeks ago. He has tried cough drops which haven't helped. He is on 2L O2 at home. He was recently discharged from the hospital for an admission for acute MI ([facility]).  Past Medical History  Diagnosis Date  . COPD (chronic obstructive pulmonary disease)   . Hypertension   . CAD (coronary artery disease), autologous vein bypass graft   . High cholesterol   . Acute chest pain 08/18/2012    "cardiologist said it was not my heart" (08/18/2012)  . Pneumonia 1938  . Shortness of breath     "related to the COPD" (08/18/2012)  . Type II diabetes mellitus   . History of blood transfusion 1938  . Colon cancer   . Chronic kidney disease, stage 4 (severe)   . Arthritis     "back, hands, neck" (08/18/2012)  . History of pancreatitis ~ 1985  . Anginal pain   . Impotence of organic origin   . Type I (juvenile type) diabetes mellitus without mention of complication, not stated as uncontrolled    Past Surgical History  Procedure Laterality Date  . Appendectomy  1938  . Inguinal hernia repair  2000's    bilaterally  . Cataract extraction w/ intraocular lens  implant, bilateral  2000's  . Coronary artery bypass graft  2000's    CABG X4  . Colectomy   2000's    "cancer; prior to having double hernia operation" (08/18/2012)   Family History  Problem Relation Age of Onset  . Heart disease Mother   . Heart disease Father    History  Substance Use Topics  . Smoking status: Former Smoker -- 4.00 packs/day for 30 years    Types: Cigarettes    Quit date: 07/13/1983  . Smokeless tobacco: Never Used  . Alcohol Use: Yes     Comment: 08/18/2012 "drank too much; stopped ~ 1985"    Review of Systems  Respiratory: Positive for cough and shortness of breath. Negative for choking, chest tightness and wheezing.   Cardiovascular: Negative for chest pain and leg swelling.  Gastrointestinal: Negative for nausea, vomiting and diarrhea.  Genitourinary: Negative for dysuria.  Psychiatric/Behavioral: Positive for confusion and agitation.  All other systems reviewed and are negative.     Allergies  Codeine and Dilaudid  Home Medications   Prior to Admission medications   Medication Sig Start Date End Date Taking? Authorizing Provider  albuterol (PROVENTIL HFA;VENTOLIN HFA) 108 (90 BASE) MCG/ACT inhaler Inhale 2 puffs into the lungs every 6 (six) hours as needed. For wheeze or shortness of breath      Historical Provider, MD  aspirin EC 81 MG tablet Take 81 mg by mouth daily.      Historical Provider, MD  atorvastatin (LIPITOR) 80 MG tablet Take 1 tablet (80 mg total) by mouth daily at 6 PM. 04/30/14   Perry Mount, PA-C  clopidogrel (PLAVIX) 75 MG tablet Take 1 tablet (75 mg total) by mouth daily with breakfast. 04/30/14   Perry Mount, PA-C  docusate sodium (COLACE) 100 MG capsule Take 100 mg by mouth daily.    Historical Provider, MD  doxazosin (CARDURA) 4 MG tablet Take 4 mg by mouth daily.    Historical Provider, MD  epoetin alfa (EPOGEN,PROCRIT) 26948 UNIT/ML injection 10,000 Units every 14 (fourteen) days.    Historical Provider, MD  Fluticasone-Salmeterol (ADVAIR) 100-50 MCG/DOSE AEPB Inhale 1 puff into the lungs every 12 (twelve) hours.       Historical Provider, MD  furosemide (LASIX) 80 MG tablet Take 80 mg by mouth 2 (two) times daily.    Historical Provider, MD  GLUCAGON EMERGENCY 1 MG injection  01/31/14   Historical Provider, MD  insulin NPH (HUMULIN N,NOVOLIN N) 100 UNIT/ML injection Sliding scale with each meal    Historical Provider, MD  insulin regular (NOVOLIN R,HUMULIN R) 100 units/mL injection Sliding scale with each meal    Historical Provider, MD  isosorbide mononitrate (IMDUR) 60 MG 24 hr tablet TAKE 1 TABLET (60 MG TOTAL) BY MOUTH DAILY. 04/27/14   Belva Crome III, MD  lipase/protease/amylase (CREON-10/PANCREASE) 12000 UNITS CPEP 2 capsules 3 (three) times daily with meals. 2 before meals (6 daily)    Historical Provider, MD  metoprolol (LOPRESSOR) 25 MG tablet Take 0.5 tablets (12.5 mg total) by mouth 2 (two) times daily. 05/14/14   Sinclair Grooms, MD  Multiple Vitamins-Minerals (MULTIVITAMINS THER. W/MINERALS) TABS Take 1 tablet by mouth daily.      Historical Provider, MD  nitroGLYCERIN (NITROSTAT) 0.4 MG SL tablet Place 1 tablet (0.4 mg total) under the tongue every 5 (five) minutes as needed for chest pain. 03/15/14   Belva Crome III, MD  omeprazole (PRILOSEC) 20 MG capsule Take 20 mg by mouth daily.      Historical Provider, MD  paricalcitol (ZEMPLAR) 1 MCG capsule Take 1 mcg by mouth every other day.     Historical Provider, MD  quiNINE (QUALAQUIN) 324 MG capsule 2 caps by mouth every evening    Historical Provider, MD  sevelamer carbonate (RENVELA) 800 MG tablet Take 1 tablet (800 mg total) by mouth 3 (three) times daily with meals. 04/30/14   Perry Mount, PA-C  simvastatin (ZOCOR) 10 MG tablet  03/13/14   Historical Provider, MD  tiotropium (SPIRIVA) 18 MCG inhalation capsule Place 18 mcg into inhaler and inhale daily.      Historical Provider, MD  traMADol (ULTRAM) 50 MG tablet Take 50 mg by mouth every 6 (six) hours as needed for moderate pain. Maximum dose= 8 tablets per day    Historical Provider, MD    Ht 5\' 6"  (1.676 m)  Wt 150 lb (68.04 kg)  BMI 24.22 kg/m2  SpO2 94% Physical Exam  Constitutional: He appears well-developed. He is cooperative.  HENT:  Mouth/Throat: Mucous membranes are dry.  Eyes: Conjunctivae and EOM are normal. Pupils are equal, round, and reactive to light.  Cardiovascular: Normal pulses.  An irregularly irregular rhythm present. Bradycardia present.  Exam reveals distant heart sounds.   No murmur heard. Pulmonary/Chest: No accessory muscle usage. Not tachypneic and not bradypneic. No respiratory distress. He has decreased breath sounds in the right lower field and the left lower field. He has no wheezes. He has no rhonchi. He  has rales in the right lower field and the left lower field.  Abdominal: Soft. Normal appearance and bowel sounds are normal. There is no tenderness. There is no rigidity, no rebound and no guarding.    ED Course  Procedures (including critical care time) Medications  vancomycin (VANCOCIN) IVPB 1000 mg/200 mL premix (not administered)  ceFEPIme (MAXIPIME) 500 mg in dextrose 5 % 50 mL IVPB (not administered)  sodium chloride 0.9 % bolus 500 mL (0 mLs Intravenous Stopped 05/16/14 1700)   Labs Review Labs Reviewed  BASIC METABOLIC PANEL - Abnormal; Notable for the following:    Sodium 135 (*)    Chloride 95 (*)    Glucose, Bld 137 (*)    BUN 73 (*)    Creatinine, Ser 5.73 (*)    GFR calc non Af Amer 8 (*)    GFR calc Af Amer 10 (*)    Anion gap 18 (*)    All other components within normal limits  CBC WITH DIFFERENTIAL - Abnormal; Notable for the following:    WBC 22.0 (*)    RBC 3.64 (*)    Hemoglobin 11.2 (*)    HCT 35.1 (*)    RDW 15.6 (*)    Neutrophils Relative % 88 (*)    Neutro Abs 19.6 (*)    Lymphocytes Relative 6 (*)    Monocytes Absolute 1.1 (*)    All other components within normal limits  CBG MONITORING, ED - Abnormal; Notable for the following:    Glucose-Capillary 129 (*)    All other components within normal  limits  I-STAT TROPOININ, ED - Abnormal; Notable for the following:    Troponin i, poc 0.09 (*)    All other components within normal limits  URINE CULTURE  URINALYSIS, ROUTINE W REFLEX MICROSCOPIC    Imaging Review Dg Chest 2 View  05/16/2014   CLINICAL DATA:  Shortness of breath, cough, congestion.  EXAM: CHEST  2 VIEW  COMPARISON:  04/24/2014  FINDINGS: Prior CABG. Heart is upper limits normal size. Diffuse interstitial prominence throughout the lungs, most pronounced in the lower lobes where there are more confluent opacities. Small bilateral pleural effusions.  No acute bony abnormality.  IMPRESSION: Increasing interstitial prominence and bibasilar opacities. Small bilateral effusions. Findings could represent edema or infection.   Electronically Signed   By: Rolm Baptise M.D.   On: 05/16/2014 17:16     EKG Interpretation   Date/Time:  Wednesday May 16 2014 15:05:05 EDT Ventricular Rate:  5 PR Interval:    QRS Duration: 107 QT Interval:  461 QTC Calculation: 457 R Axis:   91 Text Interpretation:  Atrial fibrillation Anterior infarct, old Confirmed  by BEATON  MD, ROBERT ([id number]) on 05/16/2014 4:29:32 PM      MDM   Final diagnoses:  HCAP (healthcare-associated pneumonia)  Aortic stenosis, moderate  Coronary artery disease due to calcified coronary lesion  Other emphysema  Bradycardia  Coronary artery disease involving native coronary artery of native heart without angina pectoris  Chronic diastolic CHF (congestive heart failure)   Chest x-ray shows possible Pneumonia. Patient discharged from hospital 2 weeks ago. Will treat as healthcare associated pneumonia. Blood and urine cultures obtained. Started vancomycin and cefepime.[facility] hospitalists paged and will admit patient for IV antibiotics.    Cordelia Poche, MD 05/17/14 0130

## 2014-05-16 NOTE — ED Notes (Signed)
Lab results was reported to [facility].

## 2014-05-16 NOTE — ED Notes (Signed)
Admitting physician at the bedside.

## 2014-05-16 NOTE — Progress Notes (Signed)
ANTIBIOTIC CONSULT NOTE - INITIAL  Pharmacy Consult for vancomycin + cefepime Indication: pneumonia  Allergies  Allergen Reactions  . Codeine Nausea And Vomiting  . Dilaudid [Hydromorphone Hcl] Nausea And Vomiting    "I get deathly sick"; diaphoresis    Patient Measurements: Height: 5\' 6"  (167.6 cm) Weight: 150 lb (68.04 kg) IBW/kg (Calculated) : 63.8 Adjusted Body Weight:   Vital Signs: Temp: 99.1 F (37.3 C) (07/08 1509) Temp src: Oral (07/08 1509) BP: 136/48 mmHg (07/08 1630) Pulse Rate: 48 (07/08 1630) Intake/Output from previous day:   Intake/Output from this shift:    Labs:  Recent Labs  05/16/14 1601  WBC 22.0*  HGB 11.2*  PLT 288  CREATININE 5.73*   Estimated Creatinine Clearance: 9 ml/min (by C-G formula based on Cr of 5.73). No results found for this basename: VANCOTROUGH, [name], VANCORANDOM, GENTTROUGH, GENTPEAK, GENTRANDOM, TOBRATROUGH, TOBRAPEAK, TOBRARND, AMIKACINPEAK, AMIKACINTROU, AMIKACIN,  in the last 72 hours   Microbiology: Recent Results (from the past 720 hour(s))  URINE CULTURE     Status: None   Collection Time    04/21/14  8:15 AM      Result Value Ref Range Status   Specimen Description URINE, CATHETERIZED   Final   Special Requests Normal   Final   Culture  Setup Time     Final   Value: 04/21/2014 18:42     Performed at [place]     Final   Value: >=100,000 COLONIES/ML     Performed at [facility]   Culture     Final   Value: Multiple bacterial morphotypes present, none predominant. Suggest appropriate recollection if clinically indicated.     Performed at [facility]   Report Status 04/23/2014 FINAL   Final  URINE CULTURE     Status: None   Collection Time    04/24/14 10:44 AM      Result Value Ref Range Status   Specimen Description URINE, CATHETERIZED   Final   Special Requests NONE   Final   Culture  Setup Time     Final   Value: 04/24/2014 15:36     Performed at [place]     Final   Value: >=100,000 COLONIES/ML     Performed at [facility]   Culture     Final   Value: Multiple bacterial morphotypes present, none predominant. Suggest appropriate recollection if clinically indicated.     Performed at [facility]   Report Status 04/25/2014 FINAL   Final  URINE CULTURE     Status: None   Collection Time    04/28/14  4:42 PM      Result Value Ref Range Status   Specimen Description URINE, CATHETERIZED   Final   Special Requests NONE   Final   Culture  Setup Time     Final   Value: 04/29/2014 15:11     Performed at [place]     Final   Value: >=100,000 COLONIES/ML     Performed at [facility]   Culture     Final   Value: CITROBACTER FREUNDII     Performed at [facility]   Report Status 05/01/2014 FINAL   Final   Organism ID, Bacteria CITROBACTER FREUNDII   Final    Medical History: Past Medical History  Diagnosis Date  . COPD (chronic obstructive pulmonary disease)   . Hypertension   .  CAD (coronary artery disease), autologous vein bypass graft   . High cholesterol   . Acute chest pain 08/18/2012    "cardiologist said it was not my heart" (08/18/2012)  . Pneumonia 1938  . Shortness of breath     "related to the COPD" (08/18/2012)  . Type II diabetes mellitus   . History of blood transfusion 1938  . Colon cancer   . Chronic kidney disease, stage 4 (severe)   . Arthritis     "back, hands, neck" (08/18/2012)  . History of pancreatitis ~ 1985  . Anginal pain   . Impotence of organic origin   . Type I (juvenile type) diabetes mellitus without mention of complication, not stated as uncontrolled     Medications:  Anti-infectives   Start     Dose/Rate Route Frequency Ordered Stop   05/16/14 1800  vancomycin (VANCOCIN) IVPB 1000 mg/200 mL premix     1,000 mg 200 mL/hr over 60 Minutes Intravenous Every 48 hours 05/16/14 1736     05/16/14 1800  ceFEPIme  (MAXIPIME) 500 mg in dextrose 5 % 50 mL IVPB     500 mg 100 mL/hr over 30 Minutes Intravenous Every 24 hours 05/16/14 1736       Assessment: 70 yom presented to the ED with SOB. He was recently hospitalized so broad-spectrum antibiotics to be started for possible HCAP. Tmax is 99.1 and WBC is elevated at 22. Pt has a history of CKD with Scr in the 5's regularly. He has not been initiated on HD yet.   Vanc 7/8>> Cefepime 7/8>>  Goal of Therapy:  Vancomycin trough level 15-20 mcg/ml  Plan:  1. Vanc 1gm IV Q48H 2. Cefepime 500mg  IV Q24H 3. F/u renal fxn, C&S, clinical status and trough at Raymond [name], Raymond [name] 05/16/2014,5:36 PM

## 2014-05-16 NOTE — ED Notes (Signed)
EMS - Pt coming from home when the home health RN was concerned over pt heart rate of 46, pt states he is normally brady with a rate of 30-55.  Pt has chest congestion, given 1 Albuterol treatment with EMS.  Pt uses O2 at night only at 2L.  Pt was had O2 sats of 85% on room air, placed on 2L and after treatment increased to 94%.  Pt took 1 Nitro and 324mg  Aspirin prior to EMS arrival.

## 2014-05-16 NOTE — ED Notes (Signed)
Patient transported to X-ray 

## 2014-05-16 NOTE — ED Notes (Signed)
Cardiology at the bedside.

## 2014-05-16 NOTE — ED Notes (Signed)
Phlebotomy called for i-stat

## 2014-05-16 NOTE — H&P (Signed)
History and Physical  Raymond [name] WEX:[id number] DOB: 1930/07/17 DOA: 05/16/2014   PCP: Milagros Evener, MD   Chief Complaint: Shortness of breath and coughing  HPI:  80 year old man with a history of CAD, CKD stage V, aortic stenosis, COPD, hypertension, diabetes mellitus presents with one-week history of worsening shortness of breath and cough. The patient was discharged from the hospital on 05/03/2014 after treatment for NSTEMI.  He had heart cath x 2 with DES to RCA and discharged with aspirin and Plavix. Since his discharge home, the patient has had worsening cough and gradual worsening of his shortness of breath. The patient is a poor historian. This history is obtained from review of the chart and speaking with the patient's wife at the bedside. Normally, the patient sleeps sitting up in a recliner because of shortness of breath. This has not changed. He has been doing this for the past year. The patient does not weigh himself daily. However, the patient's wife states that his peripheral edema is about the same as usual. Apparently, the patient was noted to be unresponsive by his home health care nurse. His glucose was noted to be 40. The patient was given glucagon x2 and EMS was activated. In addition, the patient also complained of chest discomfort. He was given a nitroglycerin earlier in the day with relief of his chest discomfort. He is presently pain-free. His wife also states that he has been bradycardic in the 40s since discharge from the hospital. There has not been any reports of vomiting, diarrhea, fevers, chills, abdominal pain, headaches, or visual disturbance. The patient's wife also states that the patient has had brief episodes of bilateral rapid shaking he movements that lasts approximately 30 seconds. The patient is weak during these periods of time. She states that this occurs numerous times on a daily basis.  In the emergency department, the patient was noted to have  a heart rate in the mid 40s. WBC was 22.0. Serum creatinine was 5.73. EKG showed atrial fibrillation.  ISTAT troponin was 0.09. Urinalysis showed too numerous to count WBCs. Chest x-ray showed increased interstitial prominence with bibasilar opacities, right greater than left. Assessment/Plan: Healthcare associated pneumonia -Blood cultures have been obtained in the emergency department -Continue vancomycin and cefepime -Supplemental oxygen Bradycardia/paroxysmal Afib -Metoprolol dose was recently decreased--> Will discontinue altogether -telemetry shows sinus with type 2 mobitz vs nonconducted p-waves -I consulted cardiology--spoke with Dr. Gwenlyn Found -defer anticoagulation to cardiology Acute respiratory failure -Due to HCAP but rule out degree of pulmonary edema -give lasix 80mg  IV x 1 and reevaluate in am -repeat CXR in am Chronic Diastolic CHF -93/81/0175 after shows EF 10-25%, grade 1 diastolic dysfunction -Patient appears on the hypervolemic side -give one dose lasix 80mg  IV and reeval clinically in am -repeat cxr in am -check proBNP COPD  -Aerosolized albuterol and Atrovent -No wheezing on examination Hypoglycemia -due to NPH in setting of CKD stage 5 Elevated troponin/CAD -cycle troponins -may be due to CKD -cardiology consulted -continue ASA/plavix UTI -UA shows TNTC WBC -empiric abx as discussed -Discontinue NPH -pt has chronic indwelling foley, last changed 05/02/14 Abnormal hand movements -EEG        Past Medical History  Diagnosis Date  . COPD (chronic obstructive pulmonary disease)   . Hypertension   . CAD (coronary artery disease), autologous vein bypass graft   . High cholesterol   . Acute chest pain 08/18/2012    "cardiologist said it was not my heart" (08/18/2012)  . Pneumonia  1938  . Shortness of breath     "related to the COPD" (08/18/2012)  . Type II diabetes mellitus   . History of blood transfusion 1938  . Colon cancer   . Chronic kidney  disease, stage 4 (severe)   . Arthritis     "back, hands, neck" (08/18/2012)  . History of pancreatitis ~ 1985  . Anginal pain   . Impotence of organic origin   . Type I (juvenile type) diabetes mellitus without mention of complication, not stated as uncontrolled    Past Surgical History  Procedure Laterality Date  . Appendectomy  1938  . Inguinal hernia repair  2000's    bilaterally  . Cataract extraction w/ intraocular lens  implant, bilateral  2000's  . Coronary artery bypass graft  2000's    CABG X4  . Colectomy  2000's    "cancer; prior to having double hernia operation" (08/18/2012)   Social History:  reports that he quit smoking about 30 years ago. His smoking use included Cigarettes. He has a 120 pack-year smoking history. He has never used smokeless tobacco. He reports that he drinks alcohol. He reports that he does not use illicit drugs.   Family History  Problem Relation Age of Onset  . Heart disease Mother   . Heart disease Father      Allergies  Allergen Reactions  . Codeine Nausea And Vomiting  . Dilaudid [Hydromorphone Hcl] Nausea And Vomiting    "I get deathly sick"; diaphoresis      Prior to Admission medications   Medication Sig Start Date End Date Taking? Authorizing Provider  albuterol (PROVENTIL HFA;VENTOLIN HFA) 108 (90 BASE) MCG/ACT inhaler Inhale 2 puffs into the lungs every 6 (six) hours as needed. For wheeze or shortness of breath      Historical Provider, MD  aspirin EC 81 MG tablet Take 81 mg by mouth daily.      Historical Provider, MD  atorvastatin (LIPITOR) 80 MG tablet Take 1 tablet (80 mg total) by mouth daily at 6 PM. 04/30/14   Perry Mount, PA-C  clopidogrel (PLAVIX) 75 MG tablet Take 1 tablet (75 mg total) by mouth daily with breakfast. 04/30/14   Perry Mount, PA-C  docusate sodium (COLACE) 100 MG capsule Take 100 mg by mouth daily.    Historical Provider, MD  doxazosin (CARDURA) 4 MG tablet Take 4 mg by mouth daily.    Historical  Provider, MD  epoetin alfa (EPOGEN,PROCRIT) 85631 UNIT/ML injection 10,000 Units every 14 (fourteen) days.    Historical Provider, MD  Fluticasone-Salmeterol (ADVAIR) 100-50 MCG/DOSE AEPB Inhale 1 puff into the lungs every 12 (twelve) hours.      Historical Provider, MD  furosemide (LASIX) 80 MG tablet Take 80 mg by mouth 2 (two) times daily.    Historical Provider, MD  GLUCAGON EMERGENCY 1 MG injection  01/31/14   Historical Provider, MD  insulin NPH (HUMULIN N,NOVOLIN N) 100 UNIT/ML injection Sliding scale with each meal    Historical Provider, MD  insulin regular (NOVOLIN R,HUMULIN R) 100 units/mL injection Sliding scale with each meal    Historical Provider, MD  isosorbide mononitrate (IMDUR) 60 MG 24 hr tablet TAKE 1 TABLET (60 MG TOTAL) BY MOUTH DAILY. 04/27/14   Belva Crome III, MD  lipase/protease/amylase (CREON-10/PANCREASE) 12000 UNITS CPEP 2 capsules 3 (three) times daily with meals. 2 before meals (6 daily)    Historical Provider, MD  metoprolol (LOPRESSOR) 25 MG tablet Take 0.5 tablets (12.5 mg total) by mouth 2 (  two) times daily. 05/14/14   Sinclair Grooms, MD  Multiple Vitamins-Minerals (MULTIVITAMINS THER. W/MINERALS) TABS Take 1 tablet by mouth daily.      Historical Provider, MD  nitroGLYCERIN (NITROSTAT) 0.4 MG SL tablet Place 1 tablet (0.4 mg total) under the tongue every 5 (five) minutes as needed for chest pain. 03/15/14   Belva Crome III, MD  omeprazole (PRILOSEC) 20 MG capsule Take 20 mg by mouth daily.      Historical Provider, MD  paricalcitol (ZEMPLAR) 1 MCG capsule Take 1 mcg by mouth every other day.     Historical Provider, MD  quiNINE (QUALAQUIN) 324 MG capsule 2 caps by mouth every evening    Historical Provider, MD  sevelamer carbonate (RENVELA) 800 MG tablet Take 1 tablet (800 mg total) by mouth 3 (three) times daily with meals. 04/30/14   Perry Mount, PA-C  simvastatin (ZOCOR) 10 MG tablet  03/13/14   Historical Provider, MD  tiotropium (SPIRIVA) 18 MCG inhalation  capsule Place 18 mcg into inhaler and inhale daily.      Historical Provider, MD  traMADol (ULTRAM) 50 MG tablet Take 50 mg by mouth every 6 (six) hours as needed for moderate pain. Maximum dose= 8 tablets per day    Historical Provider, MD    Review of Systems:  Constitutional:  No weight loss, night sweats, Fevers, chills, fatigue.  Head&Eyes: No headache.  No vision loss.  No eye pain or scotoma ENT:  No Difficulty swallowing,Tooth/dental problems,Sore throat,  No ear ache, post nasal drip,  Cardio-vascular:  No   dizziness, palpitations  GI:  No  abdominal pain, nausea, vomiting, diarrhea, loss of appetite, hematochezia, melena, heartburn, indigestion, Resp:  No coughing up of blood .No wheezing.No chest wall deformity  Skin:  no rash or lesions.  GU:  no dysuria, change in color of urine,  No flank pain.  Musculoskeletal:  No joint pain or swelling. No decreased range of motion. No back pain.  Psych:  No change in mood or affect.  Neurologic: No headache, no dysesthesia, no focal weakness, no vision loss.  Physical Exam: Filed Vitals:   05/16/14 1630 05/16/14 1645 05/16/14 1735 05/16/14 1830  BP: 136/48 148/45  146/98  Pulse: 48 52 53 53  Temp:      TempSrc:      Resp: 25 18 16  33  Height:      Weight:      SpO2: 95% 95% 99% 97%   General:  Alert and awake, NAD,pleasant/cooperative Head/Eye: No conjunctival hemorrhage, no icterus, [place]/AT, No nystagmus ENT:  No icterus,  No thrush,  no pharyngeal exudate Neck:  No masses, no lymphadenpathy, no bruits CV:  RRR, no rub, no gallop, no S3 Lung:  Bibasilar rhonchi. Good air movement. No wheezing. Abdomen: soft/NT, +BS, nondistended, no peritoneal signs Ext: No cyanosis, No rashes, No petechiae, No lymphangitis, 1+LE edema   Labs on Admission:  Basic Metabolic Panel:  Recent Labs Lab 05/16/14 1601  NA 135*  K 4.5  CL 95*  CO2 22  GLUCOSE 137*  BUN 73*  CREATININE 5.73*  CALCIUM 9.8   Liver Function  Tests: No results found for this basename: AST, ALT, ALKPHOS, BILITOT, PROT, ALBUMIN,  in the last 168 hours No results found for this basename: LIPASE, AMYLASE,  in the last 168 hours No results found for this basename: AMMONIA,  in the last 168 hours CBC:  Recent Labs Lab 05/16/14 1601  WBC 22.0*  NEUTROABS 19.6*  HGB  11.2*  HCT 35.1*  MCV 96.4  PLT 288   Cardiac Enzymes: No results found for this basename: CKTOTAL, CKMB, CKMBINDEX, TROPONINI,  in the last 168 hours BNP: No components found with this basename: POCBNP,  CBG:  Recent Labs Lab 05/16/14 1613  GLUCAP 129*    Radiological Exams on Admission: Dg Chest 2 View  05/16/2014   CLINICAL DATA:  Shortness of breath, cough, congestion.  EXAM: CHEST  2 VIEW  COMPARISON:  04/24/2014  FINDINGS: Prior CABG. Heart is upper limits normal size. Diffuse interstitial prominence throughout the lungs, most pronounced in the lower lobes where there are more confluent opacities. Small bilateral pleural effusions.  No acute bony abnormality.  IMPRESSION: Increasing interstitial prominence and bibasilar opacities. Small bilateral effusions. Findings could represent edema or infection.   Electronically Signed   By: Rolm Baptise M.D.   On: 05/16/2014 17:16    EKG: Independently reviewed. Afib with nonspecific ST changes    Time spent:70 minutes Code Status:   DNR Family Communication:   Wife updated at bedside   Dynver Clemson, DO  [facility] Pager [phone]  If 7PM-7AM, please contact night-coverage [email or web address] Password [facility] 05/16/2014, 6:34 PM

## 2014-05-16 NOTE — Progress Notes (Signed)
Admit date: 05/16/2014 Referring Physician  Dr. Carles Collet Primary Physician  Dr. Ardelle Anton Primary Cardiologist  Dr. Daneen Schick Reason for Consultation  SOB  HPI: This is an 80yo WM with a history of DM, HTN, HLD, CAD s/p CABG, HFpEF, CKD V, and mild AS who presented to [facility] on 04/21/14 with chest pain and ECG changes. The patient is followed by Dr. Tamala Julian as an outpatient. They have had previous discussions concerning further diagnostic testing for his CAD in the setting of severe CKD as it was thought that a cardiac cath would place him at high-risk for the need for hemodialysis.  On 6/13 he was admitted with CP and new EKG changes.  He ruled in for NSTEMI.   Due to his advanced CKD stage 5, nephrology was consulted and a discussion concerning cardiac catheterization was delayed until Dr. Tamala Julian could weigh in. Dr. Tamala Julian saw the patient on 6/16 and it was decided to proceed with aggressive management of his underlying cardiac issues. Hospice and palliative care were discussed and the patient was not interested in this approach. He understood the high risk and possibility that he may need repeat CABG/AVR for which he was not a candidate. It was decided to proceed with temporary dialysis access point placement prior to coronary angiography in the case that he need HD immediately post angiography. The hemodialysis catheter was placed on 6/16. He underwent his first cardiac catheterization on 6/17 which revealed severe 3 vessel ASCAD with patent LIMA to LAD, patent SVG to diag and SVG to OM, occluded SVG to ramus, moderate AS and normal right heart and LV filling pressures. He has a 70% stenosis in the ostium of an ungrafted RCA and 90% mid RCA. No interventions were done at that time. The films were reviewed with Dr. Tamala Julian and the plans were discussed with the patient, and it was decided to undergo repeat cardiac cath and PCI. He underwent his second cardiac cath on 6/19 and is now s/p rotation atherectomy to the RCA  with DES placement and was continued on DAPT with ASA/Plavix.  He was deemed not a candidate for CABG.  He also has chronic diastolic CHF- ECHO last admission with mild LVH with LVEF 55-60%, basal inferolateral hypokinesis noted, grade 1 diastolic dysfunction. Severe MAC with mild mitral regurgitation. Mild left atrial enlargement, calcific aortic stenosis that was found to be moderate on cath.  He was continued on Lasix 80mg  BID  His wife stated that ever since his discharge he has been SOB with cough.  Today the SOB got worse and he presented to the ER.  His wife says that his HR has also been slow in the 40's at home.  He denies any fever or chills.  He has had some mild LE edema but nothing more than what he normally has.  In the ER he was found to be ? afib but EKG to me looks like heart block.  Chest xray showed increasing interstitial prominence and bibasilar opacities, small bilateral effusions, felt to represent edema or infection.  WBC is elevated at 22 with left shift.  Troponin elevated slightly at 0.09.  Cardiology is now asked to consult.  His wife says that he had some chest pain this am but the patient is not talking much so cannot get an accurate history of the timing, duration or type of pain.     PMH:   Past Medical History  Diagnosis Date  . COPD (chronic obstructive pulmonary disease)   . Hypertension   .  CAD (coronary artery disease), autologous vein bypass graft   . High cholesterol   . Acute chest pain 08/18/2012    "cardiologist said it was not my heart" (08/18/2012)  . Pneumonia 1938  . Shortness of breath     "related to the COPD" (08/18/2012)  . Type II diabetes mellitus   . History of blood transfusion 1938  . Colon cancer   . Chronic kidney disease, stage 4 (severe)   . Arthritis     "back, hands, neck" (08/18/2012)  . History of pancreatitis ~ 1985  . Anginal pain   . Impotence of organic origin   . Type I (juvenile type) diabetes mellitus without mention of  complication, not stated as uncontrolled      PSH:   Past Surgical History  Procedure Laterality Date  . Appendectomy  1938  . Inguinal hernia repair  2000's    bilaterally  . Cataract extraction w/ intraocular lens  implant, bilateral  2000's  . Coronary artery bypass graft  2000's    CABG X4  . Colectomy  2000's    "cancer; prior to having double hernia operation" (08/18/2012)    Allergies:  Codeine and Dilaudid Prior to Admit Meds:   (Not in a hospital admission) Fam HX:    Family History  Problem Relation Age of Onset  . Heart disease Mother   . Heart disease Father    Social HX:    History   Social History  . Marital Status: Married    Spouse Name: N/A    Number of Children: N/A  . Years of Education: N/A   Occupational History  . Not on file.   Social History Main Topics  . Smoking status: Former Smoker -- 4.00 packs/day for 30 years    Types: Cigarettes    Quit date: 07/13/1983  . Smokeless tobacco: Never Used  . Alcohol Use: Yes     Comment: 08/18/2012 "drank too much; stopped ~ 1985"  . Drug Use: No  . Sexual Activity: No   Other Topics Concern  . Not on file   Social History Narrative  . No narrative on file     ROS:  All 11 ROS were addressed and are negative except what is stated in the HPI  Physical Exam: Blood pressure 146/98, pulse 53, temperature 99.1 F (37.3 C), temperature source Oral, resp. rate 20, height 5\' 6"  (1.676 m), weight 150 lb (68.04 kg), SpO2 97.00%.    General: Well developed, well nourished, in no acute distress Head: Eyes PERRLA, No xanthomas.   Normal cephalic and atramatic  Lungs:   Diffuse rhonchi and crackles bilaterally Heart:   HRRR S1 S2 Pulses are 2+ & equal.            No carotid bruit. No JVD.  No abdominal bruits. No femoral bruits. Abdomen: Bowel sounds are positive, abdomen soft and non-tender without masses  Extremities:   No clubbing, cyanosis   DP +1 1+ pedal edema in feet Neuro: Alert and oriented X  3. Psych:  Good affect, responds appropriately    Labs:   Lab Results  Component Value Date   WBC 22.0* 05/16/2014   HGB 11.2* 05/16/2014   HCT 35.1* 05/16/2014   MCV 96.4 05/16/2014   PLT 288 05/16/2014    Recent Labs Lab 05/16/14 1601  NA 135*  K 4.5  CL 95*  CO2 22  BUN 73*  CREATININE 5.73*  CALCIUM 9.8  GLUCOSE 137*   No results found  for this basename: PTT   Lab Results  Component Value Date   INR 1.15 04/21/2014   Lab Results  Component Value Date   CKTOTAL 67 11/11/2011   CKMB 3.3 11/11/2011   TROPONINI 1.59* 04/22/2014     Lab Results  Component Value Date   CHOL 110 04/21/2014   Lab Results  Component Value Date   HDL 40 04/21/2014   Lab Results  Component Value Date   LDLCALC 57 04/21/2014   Lab Results  Component Value Date   TRIG 63 04/21/2014   Lab Results  Component Value Date   CHOLHDL 2.8 04/21/2014   No results found for this basename: LDLDIRECT      Radiology:  Dg Chest 2 View  05/16/2014   CLINICAL DATA:  Shortness of breath, cough, congestion.  EXAM: CHEST  2 VIEW  COMPARISON:  04/24/2014  FINDINGS: Prior CABG. Heart is upper limits normal size. Diffuse interstitial prominence throughout the lungs, most pronounced in the lower lobes where there are more confluent opacities. Small bilateral pleural effusions.  No acute bony abnormality.  IMPRESSION: Increasing interstitial prominence and bibasilar opacities. Small bilateral effusions. Findings could represent edema or infection.   Electronically Signed   By: Rolm Baptise M.D.   On: 05/16/2014 17:16    EKG:  NSR with ?complete heart block on tele  ASSESSMENT:  1.  Bradycardia with ? Heart block on tele - will repeat EKG 2.  Acute respiratory failure most likely secondary to underlying PNA with increased WBC and bibasilar opacities.  There could be a component of acute diastolic CHF as well so I will get a BNP. 3.  ASCAD with recent NSTEMI and s/p recent rotational atherectomy and DES to the RCA on  DAPT 4.  HTN 5.  CKD stage 5 6.  Moderate AS by recent cath  PLAN:   1.  Repeat EKG to try to determine if he is in heart block 2.  Continue ASA/Plavix/statin/nitrates 3.  Stop metoprolol due to bradycardia 4.  Check TSH 5.  Treatment of PNA per Hospitalist 6.  Patient has simvastatin and atorvastatin on his med list but simvastatin was stopped on last hospital discharge 7.  Check BNP 8.  Continue current home dose of Lasix but if BNP significantly elevated will need to change to IV  Will follow with you  Sueanne Margarita, MD  05/16/2014  7:01 PM

## 2014-05-17 ENCOUNTER — Encounter (HOSPITAL_COMMUNITY): Payer: Self-pay | Admitting: *Deleted

## 2014-05-17 DIAGNOSIS — N184 Chronic kidney disease, stage 4 (severe): Secondary | ICD-10-CM

## 2014-05-17 DIAGNOSIS — I10 Essential (primary) hypertension: Secondary | ICD-10-CM

## 2014-05-17 DIAGNOSIS — I5033 Acute on chronic diastolic (congestive) heart failure: Secondary | ICD-10-CM | POA: Diagnosis present

## 2014-05-17 DIAGNOSIS — I442 Atrioventricular block, complete: Secondary | ICD-10-CM | POA: Diagnosis present

## 2014-05-17 LAB — CBC
HEMATOCRIT: 32.4 % — AB (ref 39.0–52.0)
Hemoglobin: 10.4 g/dL — ABNORMAL LOW (ref 13.0–17.0)
MCH: 30.1 pg (ref 26.0–34.0)
MCHC: 32.1 g/dL (ref 30.0–36.0)
MCV: 93.6 fL (ref 78.0–100.0)
PLATELETS: 270 10*3/uL (ref 150–400)
RBC: 3.46 MIL/uL — ABNORMAL LOW (ref 4.22–5.81)
RDW: 15.6 % — AB (ref 11.5–15.5)
WBC: 23.7 10*3/uL — AB (ref 4.0–10.5)

## 2014-05-17 LAB — BASIC METABOLIC PANEL
Anion gap: 19 — ABNORMAL HIGH (ref 5–15)
BUN: 73 mg/dL — ABNORMAL HIGH (ref 6–23)
CALCIUM: 9.5 mg/dL (ref 8.4–10.5)
CO2: 20 mEq/L (ref 19–32)
CREATININE: 5.73 mg/dL — AB (ref 0.50–1.35)
Chloride: 95 mEq/L — ABNORMAL LOW (ref 96–112)
GFR calc Af Amer: 10 mL/min — ABNORMAL LOW (ref >90)
GFR, EST NON AFRICAN AMERICAN: 8 mL/min — AB (ref >90)
Glucose, Bld: 115 mg/dL — ABNORMAL HIGH (ref 70–99)
Potassium: 4.1 mEq/L (ref 3.7–5.3)
Sodium: 134 mEq/L — ABNORMAL LOW (ref 137–147)

## 2014-05-17 LAB — TROPONIN I: Troponin I: <0.3 ng/mL (ref <0.30)

## 2014-05-17 LAB — HEMOGLOBIN A1C
Hgb A1c MFr Bld: 6.3 % — ABNORMAL HIGH (ref <5.7)
MEAN PLASMA GLUCOSE: 134 mg/dL — AB (ref <117)

## 2014-05-17 LAB — GLUCOSE, CAPILLARY
GLUCOSE-CAPILLARY: 124 mg/dL — AB (ref 70–99)
Glucose-Capillary: 118 mg/dL — ABNORMAL HIGH (ref 70–99)
Glucose-Capillary: 158 mg/dL — ABNORMAL HIGH (ref 70–99)

## 2014-05-17 LAB — TSH: TSH: 0.758 u[IU]/mL (ref 0.350–4.500)

## 2014-05-17 LAB — MRSA PCR SCREENING: MRSA by PCR: NEGATIVE

## 2014-05-17 LAB — URINE CULTURE: Colony Count: >=100000

## 2014-05-17 MED ORDER — FUROSEMIDE 10 MG/ML IJ SOLN
40.0000 mg | Freq: Two times a day (BID) | INTRAMUSCULAR | Status: DC
  Administered 2014-05-17: 40 mg via INTRAVENOUS
  Filled 2014-05-17: qty 4

## 2014-05-17 MED ORDER — RENA-VITE PO TABS
1.0000 | ORAL_TABLET | Freq: Every day | ORAL | Status: DC
  Filled 2014-05-17 (×6): qty 1

## 2014-05-17 MED ORDER — ENSURE COMPLETE PO LIQD
237.0000 mL | Freq: Three times a day (TID) | ORAL | Status: DC
  Administered 2014-05-17 – 2014-05-18 (×2): 237 mL via ORAL

## 2014-05-17 MED ORDER — IPRATROPIUM-ALBUTEROL 0.5-2.5 (3) MG/3ML IN SOLN
3.0000 mL | Freq: Three times a day (TID) | RESPIRATORY_TRACT | Status: DC
  Administered 2014-05-17 – 2014-05-23 (×17): 3 mL via RESPIRATORY_TRACT
  Filled 2014-05-17 (×21): qty 3

## 2014-05-17 MED ORDER — DEXTROSE 5 % IV SOLN
160.0000 mg | Freq: Once | INTRAVENOUS | Status: AC
  Administered 2014-05-17: 160 mg via INTRAVENOUS
  Filled 2014-05-17: qty 16

## 2014-05-17 NOTE — Consult Note (Signed)
Referred by:  Dr. Jimmy Footman  Reason for referral: [facility] placement  History of Present Illness  Raymond [name] is a 80 y.o. (1930-01-20) male w/ known CKD Stage IV-V who presents with cc: dyspnea.  He notes his breathing is somewhat improved at this point.  He denies any productive cough at this point.  This patient was admitted with sx bradycardia with near heart block, UTI and possible PNA.  During this admission the patient was found to be fluid overload.  In the setting of poor cardiac function and limited renal function, nephrology has decided to proceed with hemodialysis.  The patient is willing to proceed at this point.  The patient is  hand dominant.  The patient has not had previous access procedures.  Previous central venous cannulation procedures include: RIJ catheter.  The patient has never had a PPM placed.   Past Medical History  Diagnosis Date  . COPD (chronic obstructive pulmonary disease)   . Hypertension   . CAD (coronary artery disease), autologous vein bypass graft   . High cholesterol   . Acute chest pain 08/18/2012    "cardiologist said it was not my heart" (08/18/2012)  . Shortness of breath     "related to the COPD" (08/18/2012)  . Type II diabetes mellitus   . History of blood transfusion 1938  . Colon cancer   . Chronic kidney disease, stage 4 (severe)   . Arthritis     "back, hands, neck" (08/18/2012)  . History of pancreatitis ~ 1985  . Anginal pain   . Impotence of organic origin   . Type I (juvenile type) diabetes mellitus without mention of complication, not stated as uncontrolled   . On home oxygen therapy     "2L at night & prn" (05/16/2014)  . Pneumonia 1938  . HCAP (healthcare-associated pneumonia) 05/16/2014    Past Surgical History  Procedure Laterality Date  . Appendectomy  1938  . Inguinal hernia repair  2000's    bilaterally  . Cataract extraction w/ intraocular lens  implant, bilateral  2000's  . Coronary artery bypass graft  2000's     CABG X4  . Colectomy  2000's    "cancer; prior to having double hernia operation" (08/18/2012)    History   Social History  . Marital Status: Married    Spouse Name: N/A    Number of Children: N/A  . Years of Education: N/A   Occupational History  . Not on file.   Social History Main Topics  . Smoking status: Former Smoker -- 4.00 packs/day for 30 years    Types: Cigarettes    Quit date: 07/13/1983  . Smokeless tobacco: Never Used  . Alcohol Use: Yes     Comment: 08/18/2012 "drank too much; stopped ~ 1985"  . Drug Use: No  . Sexual Activity: No   Other Topics Concern  . Not on file   Social History Narrative  . No narrative on file    Family History  Problem Relation Age of Onset  . Heart disease Mother   . Heart disease Father     No current facility-administered medications on file prior to encounter.   Current Outpatient Prescriptions on File Prior to Encounter  Medication Sig Dispense Refill  . albuterol (PROVENTIL HFA;VENTOLIN HFA) 108 (90 BASE) MCG/ACT inhaler Inhale 2 puffs into the lungs every 6 (six) hours as needed. For wheeze or shortness of breath        . aspirin EC 81 MG  tablet Take 81 mg by mouth daily.        Marland Kitchen atorvastatin (LIPITOR) 80 MG tablet Take 1 tablet (80 mg total) by mouth daily at 6 PM.  30 tablet  11  . clopidogrel (PLAVIX) 75 MG tablet Take 1 tablet (75 mg total) by mouth daily with breakfast.  30 tablet  11  . epoetin alfa (EPOGEN,PROCRIT) 15400 UNIT/ML injection Inject 20,000 Units into the skin every 14 (fourteen) days.       . furosemide (LASIX) 80 MG tablet Take 80 mg by mouth 2 (two) times daily.      Marland Kitchen GLUCAGON EMERGENCY 1 MG injection       . insulin NPH (HUMULIN N,NOVOLIN N) 100 UNIT/ML injection Inject 20 Units into the skin daily before breakfast.       . lipase/protease/amylase (CREON-10/PANCREASE) 12000 UNITS CPEP 2 capsules 3 (three) times daily with meals. 2 before meals (6 daily)      . Multiple Vitamins-Minerals  (MULTIVITAMINS THER. W/MINERALS) TABS Take 1 tablet by mouth daily.        . nitroGLYCERIN (NITROSTAT) 0.4 MG SL tablet Place 1 tablet (0.4 mg total) under the tongue every 5 (five) minutes as needed for chest pain.  25 tablet  1  . omeprazole (PRILOSEC) 20 MG capsule Take 20 mg by mouth daily.        . paricalcitol (ZEMPLAR) 1 MCG capsule Take 1 mcg by mouth every other day.       . quiNINE (QUALAQUIN) 324 MG capsule 2 caps by mouth every evening      . sevelamer carbonate (RENVELA) 800 MG tablet Take 1 tablet (800 mg total) by mouth 3 (three) times daily with meals.  90 tablet  11  . traMADol (ULTRAM) 50 MG tablet Take 50 mg by mouth every 6 (six) hours as needed for moderate pain. Maximum dose= 8 tablets per day      . docusate sodium (COLACE) 100 MG capsule Take 100 mg by mouth daily.      Marland Kitchen doxazosin (CARDURA) 4 MG tablet Take 4 mg by mouth daily.        Allergies  Allergen Reactions  . Codeine Nausea And Vomiting  . Dilaudid [Hydromorphone Hcl] Nausea And Vomiting    "I get deathly sick"; diaphoresis     REVIEW OF SYSTEMS:  (Positives checked otherwise negative)  CARDIOVASCULAR:  []  chest pain, [x]  chest pressure, []  palpitations, [x]  shortness of breath when laying flat, [x]  shortness of breath with exertion,  []  pain in feet when walking, []  pain in feet when laying flat, []  history of blood clot in veins (DVT), []  history of phlebitis, []  swelling in legs, []  varicose veins  PULMONARY:  []  productive cough, []  asthma, []  wheezing. [x]  dyspnea  NEUROLOGIC:  [x]  weakness in arms or legs, [x]  numbness in arms or legs, []  difficulty speaking or slurred speech, []  temporary loss of vision in one eye, []  dizziness  HEMATOLOGIC:  []  bleeding problems, []  problems with blood clotting too easily  MUSCULOSKEL:  []  joint pain, []  joint swelling  GASTROINTEST:  []  vomiting blood, []  blood in stool     GENITOURINARY:  []  burning with urination, []  blood in urine  PSYCHIATRIC:  []   history of major depression  INTEGUMENTARY:  []  rashes, []  ulcers  CONSTITUTIONAL:  []  fever, []  chills  Physical Examination  Filed Vitals:   05/17/14 1455 05/17/14 1500 05/17/14 1645 05/17/14 1646  BP:  120/59 120/28 105/55  Pulse:  101  105 108  Temp:   98 F (36.7 C)   TempSrc:   Oral   Resp:  24 25 27   Height:      Weight:      SpO2: 97% 99% 94% 96%   Body mass index is 23.35 kg/(m^2).  General: A&O x 3, WD, thin, ill appearing  Head: [place]/AT  Ear/Nose/Throat: Hearing grossly intact, nares w/o erythema or drainage, oropharynx w/o Erythema/Exudate, Mallampati score: 3  Eyes: PERRLA, EOMI  Neck: Supple, no nuchal rigidity, no palpable LAD  Pulmonary: Sym exp, good air movt, CTAB, no rales, rhonchi, & wheezing  Cardiac: RRR, Nl S1, S2, no Murmurs, rubs or gallops  Vascular: Vessel Right Left  Radial Palpable Palpable  Ulnar Not Palpable Not Palpable  Brachial Palpable Palpable  Carotid Palpable, without bruit Palpable, without bruit  Aorta Not palpable N/A  Femoral Palpable Palpable  Popliteal Not palpable Not palpable  PT Not Palpable Not Palpable  DP Faintly Palpable Faintly Palpable   Gastrointestinal: soft, NTND, -G/R, - HSM, - masses, - CVAT B  Musculoskeletal: M/S 5/5 throughout , Extremities without ischemic changes   Neurologic: CN 2-12 intact , Pain and light touch intact in extremities , Motor exam as listed above  Psychiatric: Judgment intact, Mood & affect appropriate for pt's clinical situation  Dermatologic: See M/S exam for extremity exam, no rashes otherwise noted  Lymph : No Cervical, Axillary, or Inguinal lymphadenopathy   Laboratory: CBC:    Component Value Date/Time   WBC 23.7* 05/17/2014 0110   RBC 3.46* 05/17/2014 0110   HGB 10.4* 05/17/2014 0110   HCT 32.4* 05/17/2014 0110   PLT 270 05/17/2014 0110   MCV 93.6 05/17/2014 0110   MCH 30.1 05/17/2014 0110   MCHC 32.1 05/17/2014 0110   RDW 15.6* 05/17/2014 0110   LYMPHSABS 1.2 05/16/2014 1601    MONOABS 1.1* 05/16/2014 1601   EOSABS 0.1 05/16/2014 1601   BASOSABS 0.0 05/16/2014 1601    BMP:    Component Value Date/Time   NA 134* 05/17/2014 0110   K 4.1 05/17/2014 0110   CL 95* 05/17/2014 0110   CO2 20 05/17/2014 0110   GLUCOSE 115* 05/17/2014 0110   BUN 73* 05/17/2014 0110   CREATININE 5.73* 05/17/2014 0110   CALCIUM 9.5 05/17/2014 0110   GFRNONAA 8* 05/17/2014 0110   GFRAA 10* 05/17/2014 0110    Coagulation: Lab Results  Component Value Date   INR 1.15 04/21/2014   No results found for this basename: PTT    Radiology: Dg Chest 2 View  05/16/2014   CLINICAL DATA:  Shortness of breath, cough, congestion.  EXAM: CHEST  2 VIEW  COMPARISON:  04/24/2014  FINDINGS: Prior CABG. Heart is upper limits normal size. Diffuse interstitial prominence throughout the lungs, most pronounced in the lower lobes where there are more confluent opacities. Small bilateral pleural effusions.  No acute bony abnormality.  IMPRESSION: Increasing interstitial prominence and bibasilar opacities. Small bilateral effusions. Findings could represent edema or infection.   Electronically Signed   By: Rolm Baptise M.D.   On: 05/16/2014 17:16  Medical Decision Making  MANSEL STROTHER is a 80 y.o. male who presents with chronic kidney disease stage V with imminent ESRD requiring hemodialysis, heart block, CAD, fluid overload, UTI, possible PNA   Per Nephrology, pt will need tunneled dialysis catheter placement.  This is scheduled with Dr. Donnetta Hutching tomorrow. The patient is aware the risks of tunneled dialysis catheter placement include but are not limited to: bleeding, infection, central  venous injury, pneumothorax, possible venous stenosis, possible malpositioning in the venous system, and possible infections related to long-term catheter presence.  The patient was aware of these risks and agreed to proceed.   Given the patient's multiple high grade medical problems, I would defer permanent access placement until he is medically  stable.  Reconsult [place] once you feel he is stable to proceed.  I would wait on vein mapping until he has recovered hemodynamically.  Adele Barthel, MD [facility] Office: [phone] Pager: [phone]  05/17/2014, 7:06 PM

## 2014-05-17 NOTE — Progress Notes (Signed)
[facility]  Patient Status: Active (receiving services up to time of hospitalization)  [facility] is providing the following services: RN, PT and OT  If patient discharges after hours, please call [phone].   Janae Sauce 05/17/2014, 11:18 AM

## 2014-05-17 NOTE — Progress Notes (Signed)
INITIAL NUTRITION ASSESSMENT  DOCUMENTATION CODES Per approved criteria  -Severe malnutrition in the context of chronic illness   INTERVENTION:  Ensure Complete PO TID, each supplement provides 350 kcal and 13 grams of protein  NUTRITION DIAGNOSIS: Inadequate oral intake related to poor appetite as evidenced by 25 lb weight loss over the past year.   Goal: Intake to meet >90% of estimated nutrition needs.  Monitor:  PO intake, labs, weight trend.  Reason for Assessment: MST  80 y.o. male  Admitting Dx: Shortness of breath and coughing  ASSESSMENT: 80 year old man with a history of CAD, CKD stage V, aortic stenosis, COPD, hypertension, diabetes mellitus presents with one-week history of worsening shortness of breath and cough. The patient was discharged from the hospital on 05/03/2014 after treatment for NSTEMI.  Patient and his wife report that he has been eating poorly. Has lost ~25 lb over the past year. C/o poor appetite.  Nutrition Focused Physical Exam:  Subcutaneous Fat:  Orbital Region: mild depletion Upper Arm Region: mild depletion Thoracic and Lumbar Region: NA  Muscle:  Temple Region: mild depletion Clavicle Bone Region: mild depletion Clavicle and Acromion Bone Region: moderate depletion Scapular Bone Region: NA Dorsal Hand: severe depletion Patellar Region: moderate depletion Anterior Thigh Region: severe depletion Posterior Calf Region: moderate depletion  Edema: none  Pt meets criteria for severe MALNUTRITION in the context of chronic illness as evidenced by severe depletion of muscle mass with intake </= 75% of estimated energy requirement for >/= 1 month.   Height: Ht Readings from Last 1 Encounters:  05/16/14 5\' 6"  (1.676 m)    Weight: Wt Readings from Last 1 Encounters:  05/17/14 145 lb 8.1 oz (66 kg)    Ideal Body Weight: 64.5 kg  % Ideal Body Weight: 102%  Wt Readings from Last 10 Encounters:  05/17/14 145 lb 8.1 oz (66 kg)   04/30/14 149 lb 11.1 oz (67.9 kg)  04/30/14 149 lb 11.1 oz (67.9 kg)  04/30/14 149 lb 11.1 oz (67.9 kg)  04/20/14 150 lb (68.04 kg)  03/27/14 151 lb (68.493 kg)  03/14/14 151 lb (68.493 kg)  02/02/14 154 lb (69.854 kg)  08/01/13 166 lb 12.8 oz (75.66 kg)  03/31/13 173 lb (78.472 kg)    Usual Body Weight: 166 lb 10 months ago  % Usual Body Weight: 87%  BMI:  Body mass index is 23.5 kg/(m^2).  Estimated Nutritional Needs: Kcal: 1700-1900 Protein: 60-70 gm Fluid: 1.7-1.9 L  Skin: stage 1 pressure ulcer to sacrum  Diet Order: Cardiac; MVI daily  EDUCATION NEEDS: -Education needs addressed   Intake/Output Summary (Last 24 hours) at 05/17/14 0901 Last data filed at 05/17/14 0603  Gross per 24 hour  Intake      0 ml  Output    700 ml  Net   -700 ml    Last BM: None documented since admission   Labs:   Recent Labs Lab 05/16/14 1601 05/17/14 0110  NA 135* 134*  K 4.5 4.1  CL 95* 95*  CO2 22 20  BUN 73* 73*  CREATININE 5.73* 5.73*  CALCIUM 9.8 9.5  GLUCOSE 137* 115*    CBG (last 3)   Recent Labs  05/16/14 1613 05/16/14 2156  GLUCAP 129* 136*    Scheduled Meds: . antiseptic oral rinse  15 mL Mouth Rinse BID  . aspirin EC  81 mg Oral Daily  . atorvastatin  80 mg Oral q1800  . ceFEPime (MAXIPIME) IV  500 mg Intravenous Q24H  .  clopidogrel  75 mg Oral Q breakfast  . doxazosin  4 mg Oral Daily  . enoxaparin (LOVENOX) injection  30 mg Subcutaneous Q24H  . furosemide  40 mg Intravenous BID  . insulin aspart  0-9 Units Subcutaneous TID WC  . ipratropium-albuterol  3 mL Nebulization TID  . isosorbide mononitrate  60 mg Oral Daily  . mometasone-formoterol  2 puff Inhalation BID  . multivitamin with minerals  1 tablet Oral Daily  . pantoprazole  40 mg Oral Daily  . paricalcitol  1 mcg Oral QODAY  . sevelamer carbonate  800 mg Oral TID WC  . sodium chloride  3 mL Intravenous Q12H  . vancomycin  1,000 mg Intravenous Q48H    Continuous Infusions:    Past Medical History  Diagnosis Date  . COPD (chronic obstructive pulmonary disease)   . Hypertension   . CAD (coronary artery disease), autologous vein bypass graft   . High cholesterol   . Acute chest pain 08/18/2012    "cardiologist said it was not my heart" (08/18/2012)  . Shortness of breath     "related to the COPD" (08/18/2012)  . Type II diabetes mellitus   . History of blood transfusion 1938  . Colon cancer   . Chronic kidney disease, stage 4 (severe)   . Arthritis     "back, hands, neck" (08/18/2012)  . History of pancreatitis ~ 1985  . Anginal pain   . Impotence of organic origin   . Type I (juvenile type) diabetes mellitus without mention of complication, not stated as uncontrolled   . On home oxygen therapy     "2L at night & prn" (05/16/2014)  . Pneumonia 1938  . HCAP (healthcare-associated pneumonia) 05/16/2014    Past Surgical History  Procedure Laterality Date  . Appendectomy  1938  . Inguinal hernia repair  2000's    bilaterally  . Cataract extraction w/ intraocular lens  implant, bilateral  2000's  . Coronary artery bypass graft  2000's    CABG X4  . Colectomy  2000's    "cancer; prior to having double hernia operation" (08/18/2012)    Molli Barrows, [place], LDN, [place] Pager [phone] After Hours Pager [phone]

## 2014-05-17 NOTE — H&P (Signed)
Patient ID: Raymond [name] male   DOB: 1930-02-09 80 y.o.   MRN: [id number]  Reason for Consult:CKD Stage V Referring Physician: Dr. Carles Collet  HPI: Raymond [name] is a 80 y.o. male with a PMH DM, HTN, HLD, CAD s/p CABG, diastolic dysfunction grade 1 (LVEF 55-60%-follwed by Dr. Tamala Julian), CKD V (followed by Dr. Moshe Cipro, baseline Cr~5's), and mild AS who presented to ED with SOB, cough, and HR in 40s at home.  Wife provides most of the history stating that he has "gone down hill" since his admission 2 weeks ago on 04/21/14 with CP ruled out for NSTEMI.  Wife states he has been more lethargic and SOB requiring oxygen during the day (previously only using 2L at night), since that admission.  He has a poor appetite and has not been eating.  Denies any fever/chills, CP, N/V/D.  No sick contacts.  He is often  constipated.  Also reports being more confused.  He has home health care and the wife denies missing any doses of medications.  No lower extremity swelling or ascites.  He has been sleeping in a recliner for long time because he gets SOB when lying flat.  He has a chronic foley catheter.    In the ED he was found to be in possible afib.  CXR c/w interstitial prominence and bibasilar opacities. Small bilateral effusions which could represent edema or infection.  Trop mildly elevated at 0.09 in the setting of CKD Stage V.  Also with UTI.  He is on cefepime and vancomycin for HCAP coverage.  Cardiology consulted and renal consulted for management of volume status.   Brief hx: He was admitted 2 weeks ago on 6/13 for CP and ruled out for NSTEMI.  On 6/17 Dr. Martinique did LHC/RHC and found multivessel disease and moderate AS--recommended PCI of RCA vs medical management.  Dr. Tamala Julian decided to do rotoblade atherectomy/PCI/DES of RCA on 6/19.  Temp HD cath placed with concern for possible HD needs at that time but pt pulled out IJ cath on 6/20 and never needed HD.  He was on ASA/plavix and lasix $RemoveB'50mg'dFbqtOLS$  bid.    PMH:    Past Medical History  Diagnosis Date  . COPD (chronic obstructive pulmonary disease)   . Hypertension   . CAD (coronary artery disease), autologous vein bypass graft   . High cholesterol   . Acute chest pain 08/18/2012    "cardiologist said it was not my heart" (08/18/2012)  . Shortness of breath     "related to the COPD" (08/18/2012)  . Type II diabetes mellitus   . History of blood transfusion 1938  . Colon cancer   . Chronic kidney disease, stage 4 (severe)   . Arthritis     "back, hands, neck" (08/18/2012)  . History of pancreatitis ~ 1985  . Anginal pain   . Impotence of organic origin   . Type I (juvenile type) diabetes mellitus without mention of complication, not stated as uncontrolled   . On home oxygen therapy     "2L at night & prn" (05/16/2014)  . Pneumonia 1938  . HCAP (healthcare-associated pneumonia) 05/16/2014    PSH:   Past Surgical History  Procedure Laterality Date  . Appendectomy  1938  . Inguinal hernia repair  2000's    bilaterally  . Cataract extraction w/ intraocular lens  implant, bilateral  2000's  . Coronary artery bypass graft  2000's    CABG X4  . Colectomy  2000's    "  cancer; prior to having double hernia operation" (08/18/2012)    Allergies:  Allergies  Allergen Reactions  . Codeine Nausea And Vomiting  . Dilaudid [Hydromorphone Hcl] Nausea And Vomiting    "I get deathly sick"; diaphoresis    Medications:   Prior to Admission medications   Medication Sig Start Date End Date Taking? Authorizing Provider  albuterol (PROVENTIL HFA;VENTOLIN HFA) 108 (90 BASE) MCG/ACT inhaler Inhale 2 puffs into the lungs every 6 (six) hours as needed. For wheeze or shortness of breath     Yes Historical Provider, MD  aspirin EC 81 MG tablet Take 81 mg by mouth daily.     Yes Historical Provider, MD  atorvastatin (LIPITOR) 80 MG tablet Take 1 tablet (80 mg total) by mouth daily at 6 PM. 04/30/14  Yes Thereasa Parkin, PA-C  clopidogrel (PLAVIX) 75 MG tablet  Take 1 tablet (75 mg total) by mouth daily with breakfast. 04/30/14  Yes Thereasa Parkin, PA-C  epoetin alfa (EPOGEN,PROCRIT) 72050 UNIT/ML injection Inject 20,000 Units into the skin every 14 (fourteen) days.    Yes Historical Provider, MD  Fluticasone-Salmeterol (ADVAIR) 100-50 MCG/DOSE AEPB Inhale 1 puff into the lungs every 12 (twelve) hours.   Yes Historical Provider, MD  furosemide (LASIX) 80 MG tablet Take 80 mg by mouth 2 (two) times daily.   Yes Historical Provider, MD  GLUCAGON EMERGENCY 1 MG injection  01/31/14  Yes Historical Provider, MD  insulin aspart (NOVOLOG) 100 UNIT/ML injection Inject 3-4 Units into the skin 2 (two) times daily. 3 units at breakfast, 4 units at dinner iIf dinner contains starches. If dinner is a small meal, use 3 units. If blood sugar is over 200 before breakfast or before dinner, add an additional 1 unit.   Yes Historical Provider, MD  insulin NPH (HUMULIN N,NOVOLIN N) 100 UNIT/ML injection Inject 20 Units into the skin daily before breakfast.    Yes Historical Provider, MD  isosorbide mononitrate (IMDUR) 60 MG 24 hr tablet Take 60 mg by mouth daily.   Yes Historical Provider, MD  lipase/protease/amylase (CREON-10/PANCREASE) 12000 UNITS CPEP 2 capsules 3 (three) times daily with meals. 2 before meals (6 daily)   Yes Historical Provider, MD  metoprolol (LOPRESSOR) 50 MG tablet Take 50 mg by mouth 2 (two) times daily.   Yes Historical Provider, MD  Multiple Vitamins-Minerals (MULTIVITAMINS THER. W/MINERALS) TABS Take 1 tablet by mouth daily.     Yes Historical Provider, MD  nitroGLYCERIN (NITROSTAT) 0.4 MG SL tablet Place 1 tablet (0.4 mg total) under the tongue every 5 (five) minutes as needed for chest pain. 03/15/14  Yes Lyn Records III, MD  omeprazole (PRILOSEC) 20 MG capsule Take 20 mg by mouth daily.     Yes Historical Provider, MD  paricalcitol (ZEMPLAR) 1 MCG capsule Take 1 mcg by mouth every other day.    Yes Historical Provider, MD  quiNINE (QUALAQUIN) 324 MG  capsule 2 caps by mouth every evening   Yes Historical Provider, MD  sevelamer carbonate (RENVELA) 800 MG tablet Take 1 tablet (800 mg total) by mouth 3 (three) times daily with meals. 04/30/14  Yes Thereasa Parkin, PA-C  tiotropium (SPIRIVA) 18 MCG inhalation capsule Place 18 mcg into inhaler and inhale daily.   Yes Historical Provider, MD  traMADol (ULTRAM) 50 MG tablet Take 50 mg by mouth every 6 (six) hours as needed for moderate pain. Maximum dose= 8 tablets per day   Yes Historical Provider, MD  docusate sodium (COLACE) 100 MG capsule Take 100 mg  by mouth daily.    Historical Provider, MD  doxazosin (CARDURA) 4 MG tablet Take 4 mg by mouth daily.    Historical Provider, MD    Discontinued Meds:   Medications Discontinued During This Encounter  Medication Reason  . ipratropium-albuterol (DUONEB) 0.5-2.5 (3) MG/3ML nebulizer solution 3 mL   . simvastatin (ZOCOR) 10 MG tablet Change in therapy  . metoprolol (LOPRESSOR) 25 MG tablet Change in therapy  . isosorbide mononitrate (IMDUR) 60 MG 24 hr tablet Inpatient Standard  . tiotropium (SPIRIVA) 18 MCG inhalation capsule Patient has not taken in last 30 days  . Fluticasone-Salmeterol (ADVAIR) 100-50 MCG/DOSE AEPB Patient has not taken in last 30 days  . insulin regular (NOVOLIN R,HUMULIN R) 100 units/mL injection Patient has not taken in last 30 days  . multivitamin with minerals tablet 1 tablet Inpatient Standard    Social History:  reports that he quit smoking about 30 years ago. His smoking use included Cigarettes. He has a 120 pack-year smoking history. He has never used smokeless tobacco. He reports that he drinks alcohol. He reports that he does not use illicit drugs.  Family History:   Family History  Problem Relation Age of Onset  . Heart disease Mother   . Heart disease Father     Review of Systems: Noted in HPI.   Creatinine, Ser  Date/Time Value Ref Range Status  05/17/2014  1:10 AM 5.73* 0.50 - 1.35 mg/dL Final  05/16/2014   4:01 PM 5.73* 0.50 - 1.35 mg/dL Final  04/30/2014  3:30 AM 6.22* 0.50 - 1.35 mg/dL Final  04/29/2014  4:00 AM 6.21* 0.50 - 1.35 mg/dL Final  04/28/2014  3:29 AM 5.89* 0.50 - 1.35 mg/dL Final  04/28/2014  3:29 AM 5.98* 0.50 - 1.35 mg/dL Final  04/27/2014  5:12 AM 5.88* 0.50 - 1.35 mg/dL Final  04/26/2014  3:45 AM 5.72* 0.50 - 1.35 mg/dL Final  04/25/2014  6:08 AM 5.55* 0.50 - 1.35 mg/dL Final  04/24/2014  4:04 AM 5.60* 0.50 - 1.35 mg/dL Final  04/23/2014  4:14 AM 5.29* 0.50 - 1.35 mg/dL Final  04/21/2014  8:45 AM 5.12* 0.50 - 1.35 mg/dL Final  04/21/2014  1:00 AM 5.06* 0.50 - 1.35 mg/dL Final  08/19/2012  5:40 AM 4.35* 0.50 - 1.35 mg/dL Final  08/18/2012  6:49 PM 4.55* 0.50 - 1.35 mg/dL Final  08/18/2012  1:20 PM 4.63* 0.50 - 1.35 mg/dL Final  11/12/2011  5:11 AM 4.46* 0.50 - 1.35 mg/dL Final  11/11/2011  1:35 AM 4.17* 0.50 - 1.35 mg/dL Final  11/10/2011  5:53 AM 3.90* 0.50 - 1.35 mg/dL Final  11/10/2011  5:41 AM 3.97* 0.50 - 1.35 mg/dL Final    Recent Labs Lab 05/16/14 1601 05/17/14 0110  NA 135* 134*  K 4.5 4.1  CL 95* 95*  CO2 22 20  GLUCOSE 137* 115*  BUN 73* 73*  CREATININE 5.73* 5.73*  CALCIUM 9.8 9.5    Recent Labs Lab 05/16/14 1601 05/17/14 0110  WBC 22.0* 23.7*  NEUTROABS 19.6*  --   HGB 11.2* 10.4*  HCT 35.1* 32.4*  MCV 96.4 93.6  PLT 288 270   Liver Function Tests: No results found for this basename: AST, ALT, ALKPHOS, BILITOT, PROT, ALBUMIN,  in the last 168 hours No results found for this basename: LIPASE, AMYLASE,  in the last 168 hours No results found for this basename: AMMONIA,  in the last 168 hours Cardiac Enzymes:  Recent Labs Lab 05/16/14 1955 05/17/14 0110 05/17/14 0718  TROPONINI <  0.30 <0.30 <0.30   Iron Studies: No results found for this basename: IRON, TIBC, TRANSFERRIN, FERRITIN,  in the last 72 hours  Results for orders placed during the hospital encounter of 05/16/14 (from the past 48 hour(s))  BASIC METABOLIC PANEL     Status: Abnormal    Collection Time    05/16/14  4:01 PM      Result Value Ref Range   Sodium 135 (*) 137 - 147 mEq/L   Potassium 4.5  3.7 - 5.3 mEq/L   Chloride 95 (*) 96 - 112 mEq/L   CO2 22  19 - 32 mEq/L   Glucose, Bld 137 (*) 70 - 99 mg/dL   BUN 73 (*) 6 - 23 mg/dL   Creatinine, Ser 5.73 (*) 0.50 - 1.35 mg/dL   Calcium 9.8  8.4 - 10.5 mg/dL   GFR calc non Af Amer 8 (*) >90 mL/min   GFR calc Af Amer 10 (*) >90 mL/min   Comment: (NOTE)     The eGFR has been calculated using the CKD EPI equation.     This calculation has not been validated in all clinical situations.     eGFR's persistently <90 mL/min signify possible Chronic Kidney     Disease.   Anion gap 18 (*) 5 - 15  CBC WITH DIFFERENTIAL     Status: Abnormal   Collection Time    05/16/14  4:01 PM      Result Value Ref Range   WBC 22.0 (*) 4.0 - 10.5 K/uL   RBC 3.64 (*) 4.22 - 5.81 MIL/uL   Hemoglobin 11.2 (*) 13.0 - 17.0 g/dL   HCT 35.1 (*) 39.0 - 52.0 %   MCV 96.4  78.0 - 100.0 fL   MCH 30.8  26.0 - 34.0 pg   MCHC 31.9  30.0 - 36.0 g/dL   RDW 15.6 (*) 11.5 - 15.5 %   Platelets 288  150 - 400 K/uL   Neutrophils Relative % 88 (*) 43 - 77 %   Neutro Abs 19.6 (*) 1.7 - 7.7 K/uL   Lymphocytes Relative 6 (*) 12 - 46 %   Lymphs Abs 1.2  0.7 - 4.0 K/uL   Monocytes Relative 5  3 - 12 %   Monocytes Absolute 1.1 (*) 0.1 - 1.0 K/uL   Eosinophils Relative 1  0 - 5 %   Eosinophils Absolute 0.1  0.0 - 0.7 K/uL   Basophils Relative 0  0 - 1 %   Basophils Absolute 0.0  0.0 - 0.1 K/uL  CBG MONITORING, ED     Status: Abnormal   Collection Time    05/16/14  4:13 PM      Result Value Ref Range   Glucose-Capillary 129 (*) 70 - 99 mg/dL  I-STAT TROPOININ, ED     Status: Abnormal   Collection Time    05/16/14  4:20 PM      Result Value Ref Range   Troponin i, poc 0.09 (*) 0.00 - 0.08 ng/mL   Comment NOTIFIED PHYSICIAN     Comment 3            Comment: Due to the release kinetics of cTnI,     a negative result within the first hours     of the  onset of symptoms does not rule out     myocardial infarction with certainty.     If myocardial infarction is still suspected,     repeat the test at appropriate intervals.  URINALYSIS,  ROUTINE W REFLEX MICROSCOPIC     Status: Abnormal   Collection Time    05/16/14  5:39 PM      Result Value Ref Range   Color, Urine YELLOW  YELLOW   APPearance CLOUDY (*) CLEAR   Specific Gravity, Urine 1.018  1.005 - 1.030   pH 5.0  5.0 - 8.0   Glucose, UA NEGATIVE  NEGATIVE mg/dL   Hgb urine dipstick LARGE (*) NEGATIVE   Bilirubin Urine NEGATIVE  NEGATIVE   Ketones, ur NEGATIVE  NEGATIVE mg/dL   Protein, ur 100 (*) NEGATIVE mg/dL   Urobilinogen, UA 0.2  0.0 - 1.0 mg/dL   Nitrite NEGATIVE  NEGATIVE   Leukocytes, UA LARGE (*) NEGATIVE  URINE MICROSCOPIC-ADD ON     Status: Abnormal   Collection Time    05/16/14  5:39 PM      Result Value Ref Range   Squamous Epithelial / LPF RARE  RARE   WBC, UA TOO NUMEROUS TO COUNT  <3 WBC/hpf   RBC / HPF 0-2  <3 RBC/hpf   Bacteria, UA MANY (*) RARE   Urine-Other FEW YEAST     Comment: LESS THAN 10 mL OF URINE SUBMITTED  CULTURE, BLOOD (ROUTINE X 2)     Status: None   Collection Time    05/16/14  5:40 PM      Result Value Ref Range   Specimen Description BLOOD RIGHT ARM     Special Requests BOTTLES DRAWN AEROBIC AND ANAEROBIC 6CC     Culture  Setup Time       Value: 05/16/2014 21:56     Performed at [facility]   Culture       Value:        BLOOD CULTURE RECEIVED NO GROWTH TO DATE CULTURE WILL BE HELD FOR 5 DAYS BEFORE ISSUING A FINAL NEGATIVE REPORT     Performed at [facility]   Report Status PENDING    CULTURE, BLOOD (ROUTINE X 2)     Status: None   Collection Time    05/16/14  5:55 PM      Result Value Ref Range   Specimen Description BLOOD LEFT WRIST     Special Requests BOTTLES DRAWN AEROBIC AND ANAEROBIC 5CC     Culture  Setup Time       Value: 05/16/2014 21:56     Performed at [facility]   Culture       Value:         BLOOD CULTURE RECEIVED NO GROWTH TO DATE CULTURE WILL BE HELD FOR 5 DAYS BEFORE ISSUING A FINAL NEGATIVE REPORT     Performed at [facility]   Report Status PENDING    PRO B NATRIURETIC PEPTIDE     Status: Abnormal   Collection Time    05/16/14  7:55 PM      Result Value Ref Range   Pro B Natriuretic peptide (BNP) 21955.0 (*) 0 - 450 pg/mL  TROPONIN I     Status: None   Collection Time    05/16/14  7:55 PM      Result Value Ref Range   Troponin I <0.30  <0.30 ng/mL   Comment:            Due to the release kinetics of cTnI,     a negative result within the first hours     of the onset of symptoms does not rule out     myocardial infarction with  certainty.     If myocardial infarction is still suspected,     repeat the test at appropriate intervals.  GLUCOSE, CAPILLARY     Status: Abnormal   Collection Time    05/16/14  9:56 PM      Result Value Ref Range   Glucose-Capillary 136 (*) 70 - 99 mg/dL  TROPONIN I     Status: None   Collection Time    05/17/14  1:10 AM      Result Value Ref Range   Troponin I <0.30  <0.30 ng/mL   Comment:            Due to the release kinetics of cTnI,     a negative result within the first hours     of the onset of symptoms does not rule out     myocardial infarction with certainty.     If myocardial infarction is still suspected,     repeat the test at appropriate intervals.  BASIC METABOLIC PANEL     Status: Abnormal   Collection Time    05/17/14  1:10 AM      Result Value Ref Range   Sodium 134 (*) 137 - 147 mEq/L   Potassium 4.1  3.7 - 5.3 mEq/L   Chloride 95 (*) 96 - 112 mEq/L   CO2 20  19 - 32 mEq/L   Glucose, Bld 115 (*) 70 - 99 mg/dL   BUN 73 (*) 6 - 23 mg/dL   Creatinine, Ser 5.73 (*) 0.50 - 1.35 mg/dL   Calcium 9.5  8.4 - 10.5 mg/dL   GFR calc non Af Amer 8 (*) >90 mL/min   GFR calc Af Amer 10 (*) >90 mL/min   Comment: (NOTE)     The eGFR has been calculated using the CKD EPI equation.     This calculation has not  been validated in all clinical situations.     eGFR's persistently <90 mL/min signify possible Chronic Kidney     Disease.   Anion gap 19 (*) 5 - 15  CBC     Status: Abnormal   Collection Time    05/17/14  1:10 AM      Result Value Ref Range   WBC 23.7 (*) 4.0 - 10.5 K/uL   RBC 3.46 (*) 4.22 - 5.81 MIL/uL   Hemoglobin 10.4 (*) 13.0 - 17.0 g/dL   HCT 32.4 (*) 39.0 - 52.0 %   MCV 93.6  78.0 - 100.0 fL   MCH 30.1  26.0 - 34.0 pg   MCHC 32.1  30.0 - 36.0 g/dL   RDW 15.6 (*) 11.5 - 15.5 %   Platelets 270  150 - 400 K/uL  TSH     Status: None   Collection Time    05/17/14  1:10 AM      Result Value Ref Range   TSH 0.758  0.350 - 4.500 uIU/mL  HEMOGLOBIN A1C     Status: Abnormal   Collection Time    05/17/14  1:10 AM      Result Value Ref Range   Hemoglobin A1C 6.3 (*) <5.7 %   Comment: (NOTE)  According to the ADA Clinical Practice Recommendations for 2011, when     HbA1c is used as a screening test:      >=6.5%   Diagnostic of Diabetes Mellitus               (if abnormal result is confirmed)     5.7-6.4%   Increased risk of developing Diabetes Mellitus     References:Diagnosis and Classification of Diabetes Mellitus,Diabetes     VPXT,0626,94(WNIOE 1):S62-S69 and Standards of Medical Care in             Diabetes - 2011,Diabetes VOJJ,0093,81 (Suppl 1):S11-S61.   Mean Plasma Glucose 134 (*) <117 mg/dL   Comment: Performed at [facility]  TROPONIN I     Status: None   Collection Time    05/17/14  7:18 AM      Result Value Ref Range   Troponin I <0.30  <0.30 ng/mL   Comment:            Due to the release kinetics of cTnI,     a negative result within the first hours     of the onset of symptoms does not rule out     myocardial infarction with certainty.     If myocardial infarction is still suspected,     repeat the test at appropriate intervals.  MRSA PCR SCREENING     Status: None    Collection Time    05/17/14 10:48 AM      Result Value Ref Range   MRSA by PCR NEGATIVE  NEGATIVE   Comment:            The GeneXpert MRSA Assay (FDA     approved for NASAL specimens     only), is one component of a     comprehensive MRSA colonization     surveillance program. It is not     intended to diagnose MRSA     infection nor to guide or     monitor treatment for     MRSA infections.  GLUCOSE, CAPILLARY     Status: Abnormal   Collection Time    05/17/14 11:59 AM      Result Value Ref Range   Glucose-Capillary 124 (*) 70 - 99 mg/dL    Dg Chest 2 View  05/16/2014   CLINICAL DATA:  Shortness of breath, cough, congestion.  EXAM: CHEST  2 VIEW  COMPARISON:  04/24/2014  FINDINGS: Prior CABG. Heart is upper limits normal size. Diffuse interstitial prominence throughout the lungs, most pronounced in the lower lobes where there are more confluent opacities. Small bilateral pleural effusions.  No acute bony abnormality.  IMPRESSION: Increasing interstitial prominence and bibasilar opacities. Small bilateral effusions. Findings could represent edema or infection.   Electronically Signed   By: Rolm Baptise M.D.   On: 05/16/2014 17:16    Physical Exam: Blood pressure 86/47, pulse 101, temperature 97.9 F (36.6 C), temperature source Oral, resp. rate 25, height $RemoveBe'5\' 6"'exLzGkmFU$  (1.676 m), weight 144 lb 10 oz (65.6 kg), SpO2 97.00%. Constitutional: Vital signs reviewed.  Elderly male appears acutely ill, pale. HEENT: [place]/AT, PERRL, EOMI, conjunctivae pale, no scleral icterus, dry mucous membranes Neck: Supple, trachea midline normal ROM, no JVD, Cardiovascular: irregular, murmur heard  Pulmonary/Chest: breaths with mouth open, decreased breath sounds, diffuse crackles without wheezes or rhonchi Abdominal: Soft. Non-tender, non-distended, bowel sounds are normal Extremities: pedal edema, pale  Neurological: A&O x3, although slow to respond, somewhat confused at times, cranial nerve  II-XII are grossly  intact, asterixis observed  Skin: Warm, dry and intact. No rash.    Assessment/Plan:  CKD Stage V- Baseline creatinine ~5's.  Pt is followed by Dr. Moshe Cipro.  On 6/16 his Cr 5.60, today is 5.73.  Weight down from 150 on admission-->144 lbs today.  UOP 700 ml today.  O2 sat 97% on 2L.  Pt has decreased breath sounds and diffuse rales.  Pedal edema.  CXR c/w pulmonary edema vs. PNA.  BNP is elevated from 3 weeks ago (5741 on 6/13-->21955 today).  Will need HD.  -consulted [facility] for HD cath placement  -d/c furosemide 40mg  IV bid  -furosemide 160mg  IV NOW -continue sevelamer 800mg  tid with meals -hold imdur 60mg , doxazosin 4mg  daily  -continue zemplar  -strict I/O, daily weights -check iPTH, cbc, CMP, HepB core Ab, IgM, HBsAg, Mg  ?HCAP-  On vancomycin and cefepime  -mgmt per primary  H/O-  -hold BP meds   Anemia of chronic disease-  Stable.  DM- mgmt per primary  Atrial fibrillation- mgmt per cardiology  Chronic Diastolic CHF -28/41/3244 LVEF 01-02%, grade 1 diastolic dysfunction  -mgmt per cardiology     Jones Bales, MD Internal Medicine Teaching Service, PGY-2 05/17/2014, 2:13 PM I have seen and examined this patient and agree with the plan of care seen , eval, examined.  Counseled patient, wife and friend.  Do not feel PD an option at this time.  Grossly uremic and fluid overloaded.  Many comorbidities.  Discussed conservative mgmt also.  They desire trial HD.  Will get PC, assess secondary complic.  Spent over 60 min with patient and staff. .  Anja Neuzil L 05/17/2014, 4:02 PM

## 2014-05-17 NOTE — Progress Notes (Signed)
[facility] PROGRESS NOTE  Raymond [name] KDT:[id number] DOB: 08/26/1930 DOA: 05/16/2014 PCP: Milagros Evener, MD  Assessment/Plan:   Complete heart block - Pt transferred to Cardiac Stepdown unit - Cardiology managing    Acute on chronic diastolic CHF (congestive heart failure), NYHA class 4 - Lasix on board. Nephrology will be consulted to assist with diuresis    CKD (chronic kidney disease) stage 5, GFR less than 15 ml/min - Nephrology consulted today  HCAP - currently on broad spectrum antibiotics cefepime and vancomycin, will continue - supportive therapy  Code Status: DNR Family Communication: discussed with wife at bedside  Disposition Plan: ultimately wife indicating interest in Hospice   Consultants:  Cardiology  EP  Nephrology  Antibiotics:  Cefepime and Vancomycin  HPI/Subjective: Patient resting this a.m. no new complaints. Wife discussed hospice as preference moving forward  Objective: Filed Vitals:   05/17/14 1201  BP: 86/47  Pulse: 101  Temp: 97.9 F (36.6 C)  Resp: 25    Intake/Output Summary (Last 24 hours) at 05/17/14 1516 Last data filed at 05/17/14 0943  Gross per 24 hour  Intake      3 ml  Output    700 ml  Net   -697 ml   Filed Weights   05/16/14 1955 05/17/14 0600 05/17/14 1030  Weight: 65.908 kg (145 lb 4.8 oz) 66 kg (145 lb 8.1 oz) 65.6 kg (144 lb 10 oz)    Exam:   General:  Patient in no acute distress, resting supine in bed  Cardiovascular: + S1 and S2, no rubs  Respiratory: increased wob, [place] in place, no wheezes  Abdomen: soft, ND  Musculoskeletal: no clubbing   Data Reviewed: Basic Metabolic Panel:  Recent Labs Lab 05/16/14 1601 05/17/14 0110  NA 135* 134*  K 4.5 4.1  CL 95* 95*  CO2 22 20  GLUCOSE 137* 115*  BUN 73* 73*  CREATININE 5.73* 5.73*  CALCIUM 9.8 9.5   Liver Function Tests: No results found for this basename: AST, ALT, ALKPHOS, BILITOT, PROT, ALBUMIN,  in the last 168 hours No  results found for this basename: LIPASE, AMYLASE,  in the last 168 hours No results found for this basename: AMMONIA,  in the last 168 hours CBC:  Recent Labs Lab 05/16/14 1601 05/17/14 0110  WBC 22.0* 23.7*  NEUTROABS 19.6*  --   HGB 11.2* 10.4*  HCT 35.1* 32.4*  MCV 96.4 93.6  PLT 288 270   Cardiac Enzymes:  Recent Labs Lab 05/16/14 1955 05/17/14 0110 05/17/14 0718  TROPONINI <0.30 <0.30 <0.30   BNP (last 3 results)  Recent Labs  04/21/14 0845 05/16/14 1955  PROBNP 5741.0* 21955.0*   CBG:  Recent Labs Lab 05/16/14 1613 05/16/14 2156 05/17/14 1159  GLUCAP 129* 136* 124*    Recent Results (from the past 240 hour(s))  URINE CULTURE     Status: None   Collection Time    05/16/14  5:39 PM      Result Value Ref Range Status   Specimen Description URINE, CATHETERIZED   Final   Special Requests NONE   Final   Culture  Setup Time     Final   Value: 05/16/2014 18:04     Performed at [place]     Final   Value: >=100,000 COLONIES/ML     Performed at [facility]   Culture     Final   Value: Multiple bacterial morphotypes present, none predominant. Suggest appropriate recollection if clinically  indicated.     Performed at [facility]   Report Status 05/17/2014 FINAL   Final  CULTURE, BLOOD (ROUTINE X 2)     Status: None   Collection Time    05/16/14  5:40 PM      Result Value Ref Range Status   Specimen Description BLOOD RIGHT ARM   Final   Special Requests BOTTLES DRAWN AEROBIC AND ANAEROBIC [facility] [place]   Final   Culture  Setup Time     Final   Value: 05/16/2014 21:56     Performed at [facility]   Culture     Final   Value:        BLOOD CULTURE RECEIVED NO GROWTH TO DATE CULTURE WILL BE HELD FOR 5 DAYS BEFORE ISSUING A FINAL NEGATIVE REPORT     Performed at [facility]   Report Status PENDING   Incomplete  CULTURE, BLOOD (ROUTINE X 2)     Status: None   Collection Time    05/16/14  5:55 PM       Result Value Ref Range Status   Specimen Description BLOOD LEFT WRIST   Final   Special Requests BOTTLES DRAWN AEROBIC AND ANAEROBIC 5CC   Final   Culture  Setup Time     Final   Value: 05/16/2014 21:56     Performed at [facility]   Culture     Final   Value:        BLOOD CULTURE RECEIVED NO GROWTH TO DATE CULTURE WILL BE HELD FOR 5 DAYS BEFORE ISSUING A FINAL NEGATIVE REPORT     Performed at [facility]   Report Status PENDING   Incomplete  MRSA PCR SCREENING     Status: None   Collection Time    05/17/14 10:48 AM      Result Value Ref Range Status   MRSA by PCR NEGATIVE  NEGATIVE Final   Comment:            The GeneXpert MRSA Assay (FDA     approved for NASAL specimens     only), is one component of a     comprehensive MRSA colonization     surveillance program. It is not     intended to diagnose MRSA     infection nor to guide or     monitor treatment for     MRSA infections.     Studies: Dg Chest 2 View  05/16/2014   CLINICAL DATA:  Shortness of breath, cough, congestion.  EXAM: CHEST  2 VIEW  COMPARISON:  04/24/2014  FINDINGS: Prior CABG. Heart is upper limits normal size. Diffuse interstitial prominence throughout the lungs, most pronounced in the lower lobes where there are more confluent opacities. Small bilateral pleural effusions.  No acute bony abnormality.  IMPRESSION: Increasing interstitial prominence and bibasilar opacities. Small bilateral effusions. Findings could represent edema or infection.   Electronically Signed   By: Rolm Baptise M.D.   On: 05/16/2014 17:16    Scheduled Meds: . antiseptic oral rinse  15 mL Mouth Rinse BID  . aspirin EC  81 mg Oral Daily  . atorvastatin  80 mg Oral q1800  . ceFEPime (MAXIPIME) IV  500 mg Intravenous Q24H  . clopidogrel  75 mg Oral Q breakfast  . doxazosin  4 mg Oral Daily  . enoxaparin (LOVENOX) injection  30 mg Subcutaneous Q24H  . feeding supplement (ENSURE COMPLETE)  237 mL Oral TID BM  . furosemide  160 mg Intravenous Once  . insulin aspart  0-9 Units Subcutaneous TID WC  . ipratropium-albuterol  3 mL Nebulization TID  . isosorbide mononitrate  60 mg Oral Daily  . mometasone-formoterol  2 puff Inhalation BID  . [START ON 05/18/2014] multivitamin  1 tablet Oral QHS  . pantoprazole  40 mg Oral Daily  . paricalcitol  1 mcg Oral QODAY  . sevelamer carbonate  800 mg Oral TID WC  . sodium chloride  3 mL Intravenous Q12H  . vancomycin  1,000 mg Intravenous Q48H   Continuous Infusions:    Time spent: > 35 minutes    Velvet Bathe  [facility] Pager [phone]. If 7PM-7AM, please contact night-coverage at [email or web address], password [facility] 05/17/2014, 3:16 PM  LOS: 1 day

## 2014-05-17 NOTE — Progress Notes (Signed)
SUBJECTIVE:  Very sleepy  OBJECTIVE:   Vitals:   Filed Vitals:   05/16/14 1955 05/16/14 2243 05/17/14 0600 05/17/14 0800  BP: 154/69  168/70   Pulse: 55 52 54   Temp: 99.2 F (37.3 C)  97.5 F (36.4 C)   TempSrc: Oral  Oral   Resp: 24 20 26    Height: 5\' 6"  (1.676 m)     Weight: 145 lb 4.8 oz (65.908 kg)  145 lb 8.1 oz (66 kg)   SpO2: 92%  92% 94%   I&O's:   Intake/Output Summary (Last 24 hours) at 05/17/14 0839 Last data filed at 05/17/14 0603  Gross per 24 hour  Intake      0 ml  Output    700 ml  Net   -700 ml   TELEMETRY: Reviewed telemetry pt in NSR with complete heart block     PHYSICAL EXAM General:frail appearing Head: Eyes PERRLA, No xanthomas.   Normal cephalic and atramatic  Lungs:   Rhonchi anteriorly COR:  RRR with 2/6 SM at RUSB to LLSB Abdomen: Bowel sounds are positive, abdomen soft and non-tender without masses Extremities:   No clubbing, cyanosis or edema.  DP +1 Neuro: very sleepy   LABS: Basic Metabolic Panel:  Recent Labs  05/16/14 1601 05/17/14 0110  NA 135* 134*  K 4.5 4.1  CL 95* 95*  CO2 22 20  GLUCOSE 137* 115*  BUN 73* 73*  CREATININE 5.73* 5.73*  CALCIUM 9.8 9.5   Liver Function Tests: No results found for this basename: AST, ALT, ALKPHOS, BILITOT, PROT, ALBUMIN,  in the last 72 hours No results found for this basename: LIPASE, AMYLASE,  in the last 72 hours CBC:  Recent Labs  05/16/14 1601 05/17/14 0110  WBC 22.0* 23.7*  NEUTROABS 19.6*  --   HGB 11.2* 10.4*  HCT 35.1* 32.4*  MCV 96.4 93.6  PLT 288 270   Cardiac Enzymes:  Recent Labs  05/16/14 1955 05/17/14 0110 05/17/14 0718  TROPONINI <0.30 <0.30 <0.30   BNP: No components found with this basename: POCBNP,  D-Dimer: No results found for this basename: DDIMER,  in the last 72 hours Hemoglobin A1C: No results found for this basename: HGBA1C,  in the last 72 hours Fasting Lipid Panel: No results found for this basename: CHOL, HDL, LDLCALC, TRIG,  CHOLHDL, LDLDIRECT,  in the last 72 hours Thyroid Function Tests:  Recent Labs  05/17/14 0110  TSH 0.758   Anemia Panel: No results found for this basename: VITAMINB12, FOLATE, FERRITIN, TIBC, IRON, RETICCTPCT,  in the last 72 hours Coag Panel:   Lab Results  Component Value Date   INR 1.15 04/21/2014    RADIOLOGY: Dg Chest 2 View  05/16/2014   CLINICAL DATA:  Shortness of breath, cough, congestion.  EXAM: CHEST  2 VIEW  COMPARISON:  04/24/2014  FINDINGS: Prior CABG. Heart is upper limits normal size. Diffuse interstitial prominence throughout the lungs, most pronounced in the lower lobes where there are more confluent opacities. Small bilateral pleural effusions.  No acute bony abnormality.  IMPRESSION: Increasing interstitial prominence and bibasilar opacities. Small bilateral effusions. Findings could represent edema or infection.   Electronically Signed   By: Rolm Baptise M.D.   On: 05/16/2014 17:16   Dg Chest 2 View  04/21/2014   CLINICAL DATA:  Sharp left chest pain, cough, COPD  EXAM: CHEST  2 VIEW  COMPARISON:  02/02/2014  FINDINGS: Chronic interstitial markings/emphysematous changes. No focal consolidation. No pleural effusion or pneumothorax.  The heart is normal in size. Postsurgical changes related to prior CABG.  Degenerative changes of the visualized thoracolumbar spine.  IMPRESSION: No evidence of acute cardiopulmonary disease.   Electronically Signed   By: Julian Hy M.D.   On: 04/21/2014 02:20   Dg Chest Port 1 View  04/24/2014   CLINICAL DATA:  Status post placement dialysis catheter.  EXAM: PORTABLE CHEST - 1 VIEW  COMPARISON:  PA and lateral chest 04/21/2014.  FINDINGS: Right IJ approach central venous catheter is in place with the tips in the mid superior vena cava. There is no pneumothorax. The lungs appear emphysematous. The patient is status post CABG. Heart size is upper normal.  IMPRESSION: Right IJ catheter tip projects over the mid superior vena cava. Negative  for pneumothorax.  No acute cardiopulmonary disease.  Emphysema.   Electronically Signed   By: Inge Rise M.D.   On: 04/24/2014 16:57   ASSESSMENT:  1. Bradycardia with appears to be in complete Heart block on tele with transient HR into the 20's 2. Acute respiratory failure most likely secondary to underlying PNA with increased WBC and bibasilar opacities as well as acute diastolic CHF with elevated BNP.  3. ASCAD with recent NSTEMI and s/p recent rotational atherectomy and DES to the RCA on DAPT  4. HTN - BP now elevated after stopping BB.  Will follow and may need to add another antihypertensive agent 5. CKD stage 5  6. Moderate AS by recent cath  7.  Acute on chronic diastolic CHF may be exacerbated by PNA and bradycardia with heart block  PLAN:  1. Continue ASA/Plavix/statin/nitrates  2. Stopped metoprolol due to bradycardia  3. Treatment of PNA per Hospitalist  4. Patient has simvastatin and atorvastatin on his med list but simvastatin was stopped on last hospital discharge  5.  Lasix 40mg  IV BID and follow renal function closely - he may be nearing need for HD 6.  EP consult for bradycardia - will see what rhythm dose after washout of BB but may take a few days due to CKD.  Will transfer to stepdown. I have had a long talk with his wife.  He has multiple issues going on.  The PNA is treatable but the CHF may be difficult to treat in setting of ESRD.  She is leary with his poor medical condition about proceeding with dialysis.  We may be nearing need for Palliative Care.  Will discuss with Dr. Tamala Julian.  Recommend getting Renal involved.   Sueanne Margarita, MD  05/17/2014  8:39 AM

## 2014-05-18 ENCOUNTER — Inpatient Hospital Stay (HOSPITAL_COMMUNITY): Payer: Managed Care, Other (non HMO)

## 2014-05-18 ENCOUNTER — Encounter (HOSPITAL_COMMUNITY): Payer: Self-pay | Admitting: Certified Registered"

## 2014-05-18 ENCOUNTER — Encounter (HOSPITAL_COMMUNITY)
Admission: EM | Disposition: A | Discharge status: Hospice | Payer: Self-pay | Source: Home / Self Care | Attending: Family Medicine

## 2014-05-18 DIAGNOSIS — R0989 Other specified symptoms and signs involving the circulatory and respiratory systems: Secondary | ICD-10-CM

## 2014-05-18 DIAGNOSIS — N185 Chronic kidney disease, stage 5: Secondary | ICD-10-CM

## 2014-05-18 DIAGNOSIS — Z515 Encounter for palliative care: Secondary | ICD-10-CM

## 2014-05-18 DIAGNOSIS — R05 Cough: Secondary | ICD-10-CM

## 2014-05-18 DIAGNOSIS — R059 Cough, unspecified: Secondary | ICD-10-CM

## 2014-05-18 DIAGNOSIS — I442 Atrioventricular block, complete: Secondary | ICD-10-CM

## 2014-05-18 DIAGNOSIS — J189 Pneumonia, unspecified organism: Secondary | ICD-10-CM

## 2014-05-18 DIAGNOSIS — I200 Unstable angina: Secondary | ICD-10-CM

## 2014-05-18 DIAGNOSIS — R0609 Other forms of dyspnea: Secondary | ICD-10-CM

## 2014-05-18 LAB — IRON AND TIBC
Iron: <10 ug/dL — ABNORMAL LOW (ref 42–135)
UIBC: 95 ug/dL — ABNORMAL LOW (ref 125–400)

## 2014-05-18 LAB — HEPATITIS B SURFACE ANTIGEN: HEP B S AG: NEGATIVE

## 2014-05-18 LAB — HEPATITIS B CORE ANTIBODY, IGM: Hep B C IgM: NONREACTIVE

## 2014-05-18 LAB — CBC
HEMATOCRIT: 30.8 % — AB (ref 39.0–52.0)
HEMOGLOBIN: 9.8 g/dL — AB (ref 13.0–17.0)
MCH: 30.2 pg (ref 26.0–34.0)
MCHC: 31.8 g/dL (ref 30.0–36.0)
MCV: 94.8 fL (ref 78.0–100.0)
Platelets: 281 10*3/uL (ref 150–400)
RBC: 3.25 MIL/uL — ABNORMAL LOW (ref 4.22–5.81)
RDW: 15.8 % — ABNORMAL HIGH (ref 11.5–15.5)
WBC: 19.5 10*3/uL — ABNORMAL HIGH (ref 4.0–10.5)

## 2014-05-18 LAB — COMPREHENSIVE METABOLIC PANEL
ALT: 9 U/L (ref 0–53)
ANION GAP: 21 — AB (ref 5–15)
AST: 20 U/L (ref 0–37)
Albumin: 2.2 g/dL — ABNORMAL LOW (ref 3.5–5.2)
Alkaline Phosphatase: 75 U/L (ref 39–117)
BUN: 81 mg/dL — AB (ref 6–23)
CALCIUM: 9.5 mg/dL (ref 8.4–10.5)
CO2: 21 mEq/L (ref 19–32)
CREATININE: 6.3 mg/dL — AB (ref 0.50–1.35)
Chloride: 96 mEq/L (ref 96–112)
GFR calc non Af Amer: 7 mL/min — ABNORMAL LOW (ref >90)
GFR, EST AFRICAN AMERICAN: 9 mL/min — AB (ref >90)
GLUCOSE: 210 mg/dL — AB (ref 70–99)
Potassium: 4.5 mEq/L (ref 3.7–5.3)
Sodium: 138 mEq/L (ref 137–147)
TOTAL PROTEIN: 6.1 g/dL (ref 6.0–8.3)
Total Bilirubin: 0.3 mg/dL (ref 0.3–1.2)

## 2014-05-18 LAB — GLUCOSE, CAPILLARY
Glucose-Capillary: 196 mg/dL — ABNORMAL HIGH (ref 70–99)
Glucose-Capillary: 149 mg/dL — ABNORMAL HIGH (ref 70–99)
Glucose-Capillary: 121 mg/dL — ABNORMAL HIGH (ref 70–99)

## 2014-05-18 LAB — MAGNESIUM: MAGNESIUM: 2.3 mg/dL (ref 1.5–2.5)

## 2014-05-18 LAB — PARATHYROID HORMONE, INTACT (NO CA): PTH: 51.5 pg/mL (ref 14.0–72.0)

## 2014-05-18 SURGERY — INSERTION OF DIALYSIS CATHETER
Anesthesia: Monitor Anesthesia Care

## 2014-05-18 MED ORDER — ROCURONIUM BROMIDE 50 MG/5ML IV SOLN
INTRAVENOUS | Status: AC
  Filled 2014-05-18: qty 1

## 2014-05-18 MED ORDER — SUCCINYLCHOLINE CHLORIDE 20 MG/ML IJ SOLN
INTRAMUSCULAR | Status: AC
  Filled 2014-05-18: qty 1

## 2014-05-18 MED ORDER — PROPOFOL 10 MG/ML IV BOLUS
INTRAVENOUS | Status: AC
  Filled 2014-05-18: qty 20

## 2014-05-18 MED ORDER — FENTANYL CITRATE 0.05 MG/ML IJ SOLN
INTRAMUSCULAR | Status: AC
  Filled 2014-05-18: qty 5

## 2014-05-18 MED ORDER — FUROSEMIDE 10 MG/ML IJ SOLN
INTRAMUSCULAR | Status: AC
  Filled 2014-05-18: qty 4

## 2014-05-18 MED ORDER — MORPHINE SULFATE 2 MG/ML IJ SOLN
1.0000 mg | INTRAMUSCULAR | Status: DC | PRN
  Administered 2014-05-18: 1 mg via INTRAVENOUS
  Filled 2014-05-18: qty 1

## 2014-05-18 MED ORDER — ALBUTEROL SULFATE (2.5 MG/3ML) 0.083% IN NEBU
2.5000 mg | INHALATION_SOLUTION | RESPIRATORY_TRACT | Status: DC | PRN
  Administered 2014-05-20: 2.5 mg via RESPIRATORY_TRACT
  Filled 2014-05-18: qty 3

## 2014-05-18 MED ORDER — ALBUTEROL SULFATE (2.5 MG/3ML) 0.083% IN NEBU
INHALATION_SOLUTION | RESPIRATORY_TRACT | Status: AC
  Administered 2014-05-18: 2.5 mg
  Filled 2014-05-18: qty 3

## 2014-05-18 MED ORDER — DEXTROSE 5 % IV SOLN
120.0000 mg | Freq: Once | INTRAVENOUS | Status: AC
  Administered 2014-05-18: 120 mg via INTRAVENOUS
  Filled 2014-05-18: qty 12

## 2014-05-18 MED ORDER — PHENYLEPHRINE 40 MCG/ML (10ML) SYRINGE FOR IV PUSH (FOR BLOOD PRESSURE SUPPORT)
PREFILLED_SYRINGE | INTRAVENOUS | Status: AC
  Filled 2014-05-18: qty 10

## 2014-05-18 MED ORDER — LIDOCAINE HCL (CARDIAC) 20 MG/ML IV SOLN
INTRAVENOUS | Status: AC
  Filled 2014-05-18: qty 5

## 2014-05-18 SURGICAL SUPPLY — 40 items
BAG DECANTER FOR FLEXI CONT (MISCELLANEOUS) ×4 IMPLANT
BLADE 10 SAFETY STRL DISP (BLADE) ×4 IMPLANT
CATH CANNON HEMO 15F 50CM (CATHETERS) IMPLANT
CATH CANNON HEMO 15FR 19 (HEMODIALYSIS SUPPLIES) IMPLANT
CATH CANNON HEMO 15FR 23CM (HEMODIALYSIS SUPPLIES) IMPLANT
CATH CANNON HEMO 15FR 31CM (HEMODIALYSIS SUPPLIES) IMPLANT
CATH CANNON HEMO 15FR 32 (HEMODIALYSIS SUPPLIES) IMPLANT
CATH CANNON HEMO 15FR 32CM (HEMODIALYSIS SUPPLIES) IMPLANT
COVER PROBE W GEL 5X96 (DRAPES) IMPLANT
COVER SURGICAL LIGHT HANDLE (MISCELLANEOUS) ×4 IMPLANT
DECANTER SPIKE VIAL GLASS SM (MISCELLANEOUS) ×4 IMPLANT
DRAPE C-ARM 42X72 X-RAY (DRAPES) ×4 IMPLANT
DRAPE CHEST BREAST 15X10 FENES (DRAPES) ×4 IMPLANT
GAUZE SPONGE 2X2 8PLY STRL LF (GAUZE/BANDAGES/DRESSINGS) ×2 IMPLANT
GAUZE SPONGE 4X4 16PLY XRAY LF (GAUZE/BANDAGES/DRESSINGS) ×4 IMPLANT
GLOVE SS BIOGEL STRL SZ 7.5 (GLOVE) ×2 IMPLANT
GLOVE SUPERSENSE BIOGEL SZ 7.5 (GLOVE) ×2
GOWN STRL REUS W/ TWL LRG LVL3 (GOWN DISPOSABLE) ×4 IMPLANT
GOWN STRL REUS W/TWL LRG LVL3 (GOWN DISPOSABLE) ×6
KIT BASIN OR (CUSTOM PROCEDURE TRAY) ×4 IMPLANT
KIT ROOM TURNOVER OR (KITS) ×4 IMPLANT
NDL 18GX1X1/2 (RX/OR ONLY) (NEEDLE) ×1 IMPLANT
NDL HYPO 25GX1X1/2 BEV (NEEDLE) ×1 IMPLANT
NEEDLE 18GX1X1/2 (RX/OR ONLY) (NEEDLE) ×3 IMPLANT
NEEDLE 22X1 1/2 (OR ONLY) (NEEDLE) ×4 IMPLANT
NEEDLE HYPO 25GX1X1/2 BEV (NEEDLE) ×3 IMPLANT
NS IRRIG 1000ML POUR BTL (IV SOLUTION) ×4 IMPLANT
PACK SURGICAL SETUP 50X90 (CUSTOM PROCEDURE TRAY) ×4 IMPLANT
PAD ARMBOARD 7.5X6 YLW CONV (MISCELLANEOUS) ×8 IMPLANT
SOAP 2 % CHG 4 OZ (WOUND CARE) ×4 IMPLANT
SPONGE GAUZE 2X2 STER 10/PKG (GAUZE/BANDAGES/DRESSINGS) ×2
SUT ETHILON 3 0 PS 1 (SUTURE) ×4 IMPLANT
SUT VICRYL 4-0 PS2 18IN ABS (SUTURE) ×4 IMPLANT
SYR 20CC LL (SYRINGE) ×4 IMPLANT
SYR 5ML LL (SYRINGE) ×8 IMPLANT
SYR CONTROL 10ML LL (SYRINGE) ×4 IMPLANT
SYRINGE 10CC LL (SYRINGE) ×4 IMPLANT
TOWEL OR 17X24 6PK STRL BLUE (TOWEL DISPOSABLE) ×4 IMPLANT
TOWEL OR 17X26 10 PK STRL BLUE (TOWEL DISPOSABLE) ×4 IMPLANT
WATER STERILE IRR 1000ML POUR (IV SOLUTION) ×4 IMPLANT

## 2014-05-18 NOTE — Progress Notes (Signed)
I have personally seen and examined this patient and agree with the assessment/plan as outlined above by Gordy Levan MD (PGY2). Planned for HD catheter today and HD thereafter (I called the OR and he is scheduled for this at 1PM------clinically, may be safer to get a temporary catheter 1st and convert to permacath in few days based on how he looks at this time). Informed Dr.Early about this and will get CCM-line service to help place HD catheter Urijah Raynor K.,MD 05/18/2014 10:56 AM

## 2014-05-18 NOTE — Care Management Note (Addendum)
    Page 1 of 2   05/23/2014     9:52:57 AM CARE MANAGEMENT NOTE 05/23/2014  Patient:  Raymond [name], Raymond [name]   Account Number:  [id number]  Date Initiated:  05/18/2014  Documentation initiated by:  Luz Lex  Subjective/Objective Assessment:   Admitted for increased lethargy and SOB. -  CHF and CCF     Action/Plan:   Anticipated DC Date:  05/23/2014   Anticipated DC Plan:  [place]  In-house referral  Clinical Social Worker  Hospice / [place]  CM consult      Choice offered to / List presented to:             Status of service:  Completed, signed off Medicare Important Message given?  YES (If response is "NO", the following Medicare IM given date fields will be blank) Date Medicare IM given:  05/21/2014 Medicare IM given by:  Elissa Hefty Date Additional Medicare IM given:   Additional Medicare IM given by:    Discharge Disposition:  [place]  Per UR Regulation:  Reviewed for med. necessity/level of care/duration of stay  If discussed at [place] of Stay Meetings, dates discussed:   05/22/2014  05/24/2014    Comments:  Contact:  Skare,Carol Spouse [phone]                 Gilmore Laroche     [phone]  7-15  0952 debbie Macel Yearsley rn,bsn sw has [facility] bed for pt today.   7/14 1414 debbie Aprile Dickenson rn,bsn spoke w wife -pt-son. they prefer [facility] for resid hospice if poss. donna sw to give them list and contact eva [place]osp [place] about avail. 7/13 debbie Clayton Jarmon rn,bsn update sw consult that pt would like residential hospice.  05-18-14 1:30pm Luz Lex, RNBSN [phone] Patient is currently active with [facility] - for RN, PT and OT

## 2014-05-18 NOTE — Progress Notes (Signed)
CSW consulted for home health needs. Inappropriate consult, as RNCM arranges this. RNCM informed. CSW signing off.   Ky Barban, MSW, [place] Clinical Social Worker

## 2014-05-18 NOTE — Procedures (Signed)
Central Venous Catheter Insertion Procedure Note Raymond [name] [id number] 07-28-1930  Procedure: Insertion of Central Venous Catheter Indications: hd  Procedure Details Consent: Risks of procedure as well as the alternatives and risks of each were explained to the (patient/caregiver).  Consent for procedure obtained. Time Out: Verified patient identification, verified procedure, site/side was marked, verified correct patient position, special equipment/implants available, medications/allergies/relevent history reviewed, required imaging and test results available.  Performed  Maximum sterile technique was used including antiseptics, cap, gloves, gown, hand hygiene, mask and sheet. Skin prep: Chlorhexidine; local anesthetic administered A antimicrobial bonded/coated triple lumen catheter was placed in the left internal jugular vein using the Seldinger technique.  Evaluation Blood flow good Complications: Complications of none Patient did tolerate procedure well. Chest X-ray ordered to verify placement.  CXR: pending.  Raymond [name] 05/18/2014, 1:53 PM  US guided NO IJ right, concern clot base rt ij  Will notify renal rt IJ findings on [place] limited  Raymond [name]. Raymond Mould, MD, Raymond [name] Pgr: Raymond [name] Pulmonary [facility] Critical Care

## 2014-05-18 NOTE — Progress Notes (Signed)
[facility] PROGRESS NOTE  Raymond [name] DEY:[id number] DOB: August 30, 1930 DOA: 05/16/2014 PCP: Milagros Evener, MD  Assessment/Plan:   Complete heart block - Pt transferred to Cardiac Stepdown unit - Cardiology managing. Plans currently are to dialyze patient with goal being to help clear his body of any B blocker that may still be in his system given recent reports and EKG findings consistent with complete heart block.    Acute on chronic diastolic CHF (congestive heart failure), NYHA class 4 - Complicated by history of chronic kidney disease stage V. After discussion with nephrology group family willing to try hemodialysis. Vascular surgery was subsequently consulted and plans are for dialysis on 05/18/2014.  - Nephrology currently assisting with diuresis and I will defer further recommendations to them at this juncture. I agree with hemodialysis at this juncture. Spouse aware and agreeable.     CKD (chronic kidney disease) stage 5, GFR less than 15 ml/min - Nephrology managing.   Diabetes mellitus - Patient currently n.p.o. but once able to eat would recommend diabetic diet. - Patient currently on sliding scale insulin - Given that patient is n.p.o. we'll adjust CBG monitoring to every 4 hours  HCAP - currently on broad spectrum antibiotics cefepime and vancomycin - My index of suspicion is that currently shortness of breath secondary to patient being fluid overloaded given his history of CHF (diastolic) in context of chronic kidney disease stage V. Patient is currently afebrile but currently I do not have any other good reasons of why his white blood cell count would be elevated above normal values. As such at this juncture favor continuing broad-spectrum antibiotics  Code Status: DNR Family Communication: discussed with wife at bedside  Disposition Plan: Hemodialysis and reassess condition and go from there. Wife initially favoring  hospice   Consultants:  Cardiology  EP  Nephrology  Antibiotics:  Cefepime and Vancomycin  HPI/Subjective: Patient is more alert. No new complaints  Objective: Filed Vitals:   05/18/14 0900  BP:   Pulse: 44  Temp:   Resp: 25    Intake/Output Summary (Last 24 hours) at 05/18/14 1124 Last data filed at 05/18/14 0900  Gross per 24 hour  Intake    130 ml  Output   1100 ml  Net   -970 ml   Filed Weights   05/16/14 1955 05/17/14 0600 05/17/14 1030  Weight: 65.908 kg (145 lb 4.8 oz) 66 kg (145 lb 8.1 oz) 65.6 kg (144 lb 10 oz)    Exam:   General:  Alert and awake, in no acute distress  Cardiovascular: + S1 and S2, no rubs  Respiratory: increased wob, [place] in place, no wheezes, rales diffusely  Abdomen: soft, ND  Musculoskeletal: no clubbing   Data Reviewed: Basic Metabolic Panel:  Recent Labs Lab 05/16/14 1601 05/17/14 0110 05/18/14 0330  NA 135* 134* 138  K 4.5 4.1 4.5  CL 95* 95* 96  CO2 22 20 21   GLUCOSE 137* 115* 210*  BUN 73* 73* 81*  CREATININE 5.73* 5.73* 6.30*  CALCIUM 9.8 9.5 9.5  MG  --   --  2.3   Liver Function Tests:  Recent Labs Lab 05/18/14 0330  AST 20  ALT 9  ALKPHOS 75  BILITOT 0.3  PROT 6.1  ALBUMIN 2.2*   No results found for this basename: LIPASE, AMYLASE,  in the last 168 hours No results found for this basename: AMMONIA,  in the last 168 hours CBC:  Recent Labs Lab 05/16/14 1601 05/17/14 0110 05/18/14 0330  WBC 22.0* 23.7* 19.5*  NEUTROABS 19.6*  --   --   HGB 11.2* 10.4* 9.8*  HCT 35.1* 32.4* 30.8*  MCV 96.4 93.6 94.8  PLT 288 270 281   Cardiac Enzymes:  Recent Labs Lab 05/16/14 1955 05/17/14 0110 05/17/14 0718  TROPONINI <0.30 <0.30 <0.30   BNP (last 3 results)  Recent Labs  04/21/14 0845 05/16/14 1955  PROBNP 5741.0* 21955.0*   CBG:  Recent Labs Lab 05/16/14 2156 05/17/14 1159 05/17/14 1643 05/17/14 2157 05/18/14 0749  GLUCAP 136* 124* 118* 158* 196*    Recent Results (from  the past 240 hour(s))  URINE CULTURE     Status: None   Collection Time    05/16/14  5:39 PM      Result Value Ref Range Status   Specimen Description URINE, CATHETERIZED   Final   Special Requests NONE   Final   Culture  Setup Time     Final   Value: 05/16/2014 18:04     Performed at [place]     Final   Value: >=100,000 COLONIES/ML     Performed at [facility]   Culture     Final   Value: Multiple bacterial morphotypes present, none predominant. Suggest appropriate recollection if clinically indicated.     Performed at [facility]   Report Status 05/17/2014 FINAL   Final  CULTURE, BLOOD (ROUTINE X 2)     Status: None   Collection Time    05/16/14  5:40 PM      Result Value Ref Range Status   Specimen Description BLOOD RIGHT ARM   Final   Special Requests BOTTLES DRAWN AEROBIC AND ANAEROBIC [facility]   Final   Culture  Setup Time     Final   Value: 05/16/2014 21:56     Performed at [facility]   Culture     Final   Value:        BLOOD CULTURE RECEIVED NO GROWTH TO DATE CULTURE WILL BE HELD FOR 5 DAYS BEFORE ISSUING A FINAL NEGATIVE REPORT     Performed at [facility]   Report Status PENDING   Incomplete  CULTURE, BLOOD (ROUTINE X 2)     Status: None   Collection Time    05/16/14  5:55 PM      Result Value Ref Range Status   Specimen Description BLOOD LEFT WRIST   Final   Special Requests BOTTLES DRAWN AEROBIC AND ANAEROBIC 5CC   Final   Culture  Setup Time     Final   Value: 05/16/2014 21:56     Performed at [facility]   Culture     Final   Value:        BLOOD CULTURE RECEIVED NO GROWTH TO DATE CULTURE WILL BE HELD FOR 5 DAYS BEFORE ISSUING A FINAL NEGATIVE REPORT     Performed at [facility]   Report Status PENDING   Incomplete  MRSA PCR SCREENING     Status: None   Collection Time    05/17/14 10:48 AM      Result Value Ref Range Status   MRSA by PCR NEGATIVE  NEGATIVE Final   Comment:             The GeneXpert MRSA Assay (FDA     approved for NASAL specimens     only), is one component of a     comprehensive MRSA colonization  surveillance program. It is not     intended to diagnose MRSA     infection nor to guide or     monitor treatment for     MRSA infections.     Studies: Dg Chest 2 View  05/16/2014   CLINICAL DATA:  Shortness of breath, cough, congestion.  EXAM: CHEST  2 VIEW  COMPARISON:  04/24/2014  FINDINGS: Prior CABG. Heart is upper limits normal size. Diffuse interstitial prominence throughout the lungs, most pronounced in the lower lobes where there are more confluent opacities. Small bilateral pleural effusions.  No acute bony abnormality.  IMPRESSION: Increasing interstitial prominence and bibasilar opacities. Small bilateral effusions. Findings could represent edema or infection.   Electronically Signed   By: Rolm Baptise M.D.   On: 05/16/2014 17:16    Scheduled Meds: . antiseptic oral rinse  15 mL Mouth Rinse BID  . aspirin EC  81 mg Oral Daily  . atorvastatin  80 mg Oral q1800  . ceFEPime (MAXIPIME) IV  500 mg Intravenous Q24H  . clopidogrel  75 mg Oral Q breakfast  . doxazosin  4 mg Oral Daily  . enoxaparin (LOVENOX) injection  30 mg Subcutaneous Q24H  . feeding supplement (ENSURE COMPLETE)  237 mL Oral TID BM  . furosemide      . furosemide  120 mg Intravenous Once  . insulin aspart  0-9 Units Subcutaneous TID WC  . ipratropium-albuterol  3 mL Nebulization TID  . isosorbide mononitrate  60 mg Oral Daily  . mometasone-formoterol  2 puff Inhalation BID  . multivitamin  1 tablet Oral QHS  . pantoprazole  40 mg Oral Daily  . paricalcitol  1 mcg Oral QODAY  . sevelamer carbonate  800 mg Oral TID WC  . sodium chloride  3 mL Intravenous Q12H  . vancomycin  1,000 mg Intravenous Q48H   Continuous Infusions:    Time spent: > 35 minutes    Velvet Bathe  [facility] Pager [phone]. If 7PM-7AM, please contact night-coverage at [email or web address],  password [facility] 05/18/2014, 11:24 AM  LOS: 2 days

## 2014-05-18 NOTE — Consult Note (Signed)
Patient Raymond [name]      DOB: [birth year]      RXV:[id number]     Consult Note from the Palliative Medicine Team at [facility] Requested by: Dr Rayann Heman      PCP: Milagros Evener, MD Reason for Consultation: Goals of Care     Phone Number:[phone]  Assessment/Recommendations: 80 yo male with multiple medical problems including CKD V with AKI, Severe AS, Diastolic Dysfx, HTN, DM who presented on 05/16/14 with SOB. Found to have suspected HCAP with worsening renal function necessitating dialysis, complete heart block, and acute respiratory failure.     Goals of Care: 1.  Code Status: DNR  2. Goals of Care- Attempted to speak with patient today while on dialysis. Difficult for him to discuss current illness understanding and goals secondary to conversational dyspnea.  Even without turning conversation to difficult nature of his multiple medical conditions, he asks if we could cut converstaion short.  He is okay with me contacting his wife and talking with her.  Wife is hard of hearing but has friend who I spoke with on phone. They plan to be in tomorrow and I will try to meet with them to discuss care further for [facility]. Unfortunately form reviewing notes this evening, Mr Nettleton appears to having increasing dyspnea and respiratory difficulty despite attempts at volume removal with HD.  Wife would be his surrogate decision maker should he lose capacity.   3. Symptom Management:   1. Dyspnea- attempts at volume removal through dialysis given undelrying clinical scenario.  If worsening dyspnea, can use low dose opioids, but would avoid morphine given his renal dysfunction low dose dilaudid (0.4mg  IV) or Fentanyl 27mcg IV can be used PRN 2. Cough- Likeyl PNA and volume overload. Currently undergoign active treatment for underlying causes. Opioids can be effective here as well  4. Psychosocial: Lives at home with his wife whom is hard of hearing but able to read lips well.   They also receive help from family friend Alberteen Sam whom is a PA.   5. Spiritual: Will need to address further in follow-up.     Brief HPI:  80 yo male with PMHx of CKD V, Severe AS, Diastolic Dysfn, HTN, DM, recent admission for NSTEMI in setting of CAD s/p CABG.  Admitted on 7/8 with dyspnea and cough, bradycardia. Found to have suspected HCAP, AKI (now on dialysis), complete heart block in setting of beta blocker use, and acute respiratory failure related to volume overload/PNA.  Hemodialysis started this admission with volume overload and failure to adequately respond to diuresis.  Also followed by cardiology for bradycardia and complete heart block. His bradycardia/3rd deg block also in setting of beta blocker use which has been discontinued. He was underoging first dialysis session today after placement of IJ catheter.   Unfortunately difficult for him to converse this afternoon 2/2 converstaion dyspnea. Felt like things have not gone well since his last hospital admission. Has poor functional ability at home, but unable to delineate exactly how he has been doing. Has not noted improvement in SOB or cough thus far on dialysis. Denies any pain.     ROS: Unable to obtain full ROS 2/2  conversation dyspnea    PMH:  Past Medical History  Diagnosis Date  . COPD (chronic obstructive pulmonary disease)   . Hypertension   . CAD (coronary artery disease), autologous vein bypass graft   . High cholesterol   . Acute chest pain 08/18/2012    "  cardiologist said it was not my heart" (08/18/2012)  . Shortness of breath     "related to the COPD" (08/18/2012)  . Type II diabetes mellitus   . History of blood transfusion 1938  . Colon cancer   . Chronic kidney disease, stage 4 (severe)   . Arthritis     "back, hands, neck" (08/18/2012)  . History of pancreatitis ~ 1985  . Anginal pain   . Impotence of organic origin   . Type I (juvenile type) diabetes mellitus without mention of complication,  not stated as uncontrolled   . On home oxygen therapy     "2L at night & prn" (05/16/2014)  . Pneumonia 1938  . HCAP (healthcare-associated pneumonia) 05/16/2014     PSH: Past Surgical History  Procedure Laterality Date  . Appendectomy  1938  . Inguinal hernia repair  2000's    bilaterally  . Cataract extraction w/ intraocular lens  implant, bilateral  2000's  . Coronary artery bypass graft  2000's    CABG X4  . Colectomy  2000's    "cancer; prior to having double hernia operation" (08/18/2012)   I have reviewed the [place] and SH and  If appropriate update it with new information. Allergies  Allergen Reactions  . Codeine Nausea And Vomiting  . Dilaudid [Hydromorphone Hcl] Nausea And Vomiting    "I get deathly sick"; diaphoresis   Scheduled Meds: . antiseptic oral rinse  15 mL Mouth Rinse BID  . aspirin EC  81 mg Oral Daily  . atorvastatin  80 mg Oral q1800  . ceFEPime (MAXIPIME) IV  500 mg Intravenous Q24H  . clopidogrel  75 mg Oral Q breakfast  . doxazosin  4 mg Oral Daily  . enoxaparin (LOVENOX) injection  30 mg Subcutaneous Q24H  . feeding supplement (ENSURE COMPLETE)  237 mL Oral TID BM  . furosemide      . insulin aspart  0-9 Units Subcutaneous TID WC  . ipratropium-albuterol  3 mL Nebulization TID  . isosorbide mononitrate  60 mg Oral Daily  . mometasone-formoterol  2 puff Inhalation BID  . multivitamin  1 tablet Oral QHS  . pantoprazole  40 mg Oral Daily  . paricalcitol  1 mcg Oral QODAY  . sevelamer carbonate  800 mg Oral TID WC  . sodium chloride  3 mL Intravenous Q12H  . vancomycin  1,000 mg Intravenous Q48H   Continuous Infusions:  PRN Meds:.acetaminophen, acetaminophen, albuterol, morphine injection, ondansetron (ZOFRAN) IV, ondansetron, traMADol    BP 153/36  Pulse 75  Temp(Src) 97.8 F (36.6 C) (Axillary)  Resp 30  Ht 5\' 6"  (1.676 m)  Wt 62.5 kg (137 lb 12.6 oz)  BMI 22.25 kg/m2  SpO2 100%   PPS: 20-30   Intake/Output Summary (Last 24 hours) at  05/18/14 2142 Last data filed at 05/18/14 1939  Gross per 24 hour  Intake    220 ml  Output   3800 ml  Net  -3580 ml    Physical Exam:  General: Alert, conversational dyspnea HEENT:  [place], mmm Chest:   Scattered coarse breath sounds, symm expansion CVS: RRR Abdomen: soft, NT, ND Ext:  edematous  Labs: CBC    Component Value Date/Time   WBC 19.5* 05/18/2014 0330   RBC 3.25* 05/18/2014 0330   HGB 9.8* 05/18/2014 0330   HCT 30.8* 05/18/2014 0330   PLT 281 05/18/2014 0330   MCV 94.8 05/18/2014 0330   MCH 30.2 05/18/2014 0330   MCHC 31.8 05/18/2014 0330  RDW 15.8* 05/18/2014 0330   LYMPHSABS 1.2 05/16/2014 1601   MONOABS 1.1* 05/16/2014 1601   EOSABS 0.1 05/16/2014 1601   BASOSABS 0.0 05/16/2014 1601    BMET    Component Value Date/Time   NA 138 05/18/2014 0330   K 4.5 05/18/2014 0330   CL 96 05/18/2014 0330   CO2 21 05/18/2014 0330   GLUCOSE 210* 05/18/2014 0330   BUN 81* 05/18/2014 0330   CREATININE 6.30* 05/18/2014 0330   CALCIUM 9.5 05/18/2014 0330   GFRNONAA 7* 05/18/2014 0330   GFRAA 9* 05/18/2014 0330    CMP     Component Value Date/Time   NA 138 05/18/2014 0330   K 4.5 05/18/2014 0330   CL 96 05/18/2014 0330   CO2 21 05/18/2014 0330   GLUCOSE 210* 05/18/2014 0330   BUN 81* 05/18/2014 0330   CREATININE 6.30* 05/18/2014 0330   CALCIUM 9.5 05/18/2014 0330   PROT 6.1 05/18/2014 0330   ALBUMIN 2.2* 05/18/2014 0330   AST 20 05/18/2014 0330   ALT 9 05/18/2014 0330   ALKPHOS 75 05/18/2014 0330   BILITOT 0.3 05/18/2014 0330   GFRNONAA 7* 05/18/2014 0330   GFRAA 9* 05/18/2014 0330   05/16/14 CXR IMPRESSION:  Increasing interstitial prominence and bibasilar opacities. Small  bilateral effusions. Findings could represent edema or infection.     Time In Time Out Total Time Spent with Patient Total Overall Time  440 530 30 minutes 50 minutes    Greater than 50%  of this time was spent counseling and coordinating care related to the above assessment and plan.   Doran Clay  D.O. Palliative Medicine Team at [facility]  Team Phone: [phone]

## 2014-05-18 NOTE — Progress Notes (Addendum)
    Subjective:  Denies CP; dyspnea improved   Objective:  Filed Vitals:   05/18/14 0900 05/18/14 1000 05/18/14 1100 05/18/14 1200  BP: 158/37 143/41 140/37 141/37  Pulse: 44 52 46 48  Temp:    98.5 F (36.9 C)  TempSrc:    Oral  Resp: 25 19 23 21   Height:      Weight:      SpO2: 98% 93% 94% 95%    Intake/Output from previous day:  Intake/Output Summary (Last 24 hours) at 05/18/14 1234 Last data filed at 05/18/14 0900  Gross per 24 hour  Intake    130 ml  Output   1100 ml  Net   -970 ml    Physical Exam: Physical exam: Well-developed frail in no acute distress.  Skin is warm and dry.  HEENT is normal.  Neck is supple.  Chest diminished BS throughout Cardiovascular exam is bradycardic; 3/6 systolic murmur Abdominal exam nontender or distended. No masses palpated. Extremities show no edema. neuro grossly intact    Lab Results: Basic Metabolic Panel:  Recent Labs  05/17/14 0110 05/18/14 0330  NA 134* 138  K 4.1 4.5  CL 95* 96  CO2 20 21  GLUCOSE 115* 210*  BUN 73* 81*  CREATININE 5.73* 6.30*  CALCIUM 9.5 9.5  MG  --  2.3   CBC:  Recent Labs  05/16/14 1601 05/17/14 0110 05/18/14 0330  WBC 22.0* 23.7* 19.5*  NEUTROABS 19.6*  --   --   HGB 11.2* 10.4* 9.8*  HCT 35.1* 32.4* 30.8*  MCV 96.4 93.6 94.8  PLT 288 270 281   Cardiac Enzymes:  Recent Labs  05/16/14 1955 05/17/14 0110 05/17/14 0718  TROPONINI <0.30 <0.30 <0.30     Assessment/Plan:  1 bradycardia-patient is in complete heart block at the time of this evaluation. He is on metoprolol at home but has not had any for 48 hours. Continue to hold beta blocker. Monitor heart rate. Electrophysiology evaluation as he will most likely require pacemaker. 2 coronary artery disease status post recent PCI-Continue aspirin, Plavix and statin. 3 aortic stenosis-moderate on most recent echocardiogram. 4 end-stage renal disease-plan is for dialysis today. 5 severe COPD 6 possible  pneumonia-antibiotics per primary care. 7 acute on chronic diastolic congestive heart failure-continue Lasix. Patient is chronically ill. However dialysis is planned and he requests pacemaker if necessary. Raymond [name] 05/18/2014, 12:34 PM

## 2014-05-18 NOTE — Progress Notes (Signed)
Notified Dr. Wendee Beavers of pt's current clinical s/s.  Pt states, "im going to die"  Pt is DNR and all measures besides bipap have been taken to treat his respiratory distress.  HD nurse continues with dialysis, states, "1782ml or so taken off so far"  Pt is satting 99% on venti mask. Will report for oncoming nurse to continue to monitor closely and to request Bipap if needed per Dr. Wendee Beavers.

## 2014-05-18 NOTE — Progress Notes (Signed)
Patient ID: Raymond [name] male   DOB: 07-15-1930 80 y.o.   MRN: [id number]  S:Pt feeling more SOB this AM.  He feels especially SOB when moving around.  Reports breathing not much better after lasix yesterday. UOP 1.1L yesterday.  Cr 6.30 today (5.73 yesterday).   O:  VS: BP 162/37  Pulse 44  Temp(Src) 98.6 F (37 C) (Oral)  Resp 25  Ht 5\' 6"  (1.676 m)  Wt 144 lb 10 oz (65.6 kg)  BMI 23.35 kg/m2  SpO2 98%  Intake/Output:  Intake/Output Summary (Last 24 hours) at 05/18/14 1015 Last data filed at 05/18/14 0900  Gross per 24 hour  Intake    130 ml  Output   1100 ml  Net   -970 ml    Weight change: Filed Weights   05/16/14 1955 05/17/14 0600 05/17/14 1030  Weight: 145 lb 4.8 oz (65.908 kg) 145 lb 8.1 oz (66 kg) 144 lb 10 oz (65.6 kg)    Physical Exam: Constitutional: Vital signs reviewed. Elderly male appears acutely ill, pale.  HEENT: Pale, PERRL, EOMI, conjunctivae pale, no scleral icterus, dry mucous membranes  Neck: Supple, trachea midline Cardiovascular: irregular, murmur heard  Pulmonary/Chest: increased work of breathing, breaths with mouth open, decreased breath sounds, diffuse crackles with wheezes no rhonchi  Abdominal: Soft. Non-tender, non-distended, bowel sounds are normal  Extremities: pedal edema, pale  Neurological: A&O x3, although slow to respond, somewhat confused at times, cranial nerve II-XII are grossly intact, asterixis observed  Skin: Warm, dry and intact. No rash.    Recent Labs Lab 05/16/14 1601 05/17/14 0110 05/18/14 0330  NA 135* 134* 138  K 4.5 4.1 4.5  CL 95* 95* 96  CO2 22 20 21   GLUCOSE 137* 115* 210*  BUN 73* 73* 81*  CREATININE 5.73* 5.73* 6.30*  ALBUMIN  --   --  2.2*  CALCIUM 9.8 9.5 9.5  AST  --   --  20  ALT  --   --  9    Liver Function Tests:  Recent Labs Lab 05/18/14 0330  AST 20  ALT 9  ALKPHOS 75  BILITOT 0.3  PROT 6.1  ALBUMIN 2.2*   No results found for this basename: LIPASE, AMYLASE,  in the last 168  hours No results found for this basename: AMMONIA,  in the last 168 hours  CBC:  Recent Labs Lab 05/16/14 1601 05/17/14 0110 05/18/14 0330  WBC 22.0* 23.7* 19.5*  NEUTROABS 19.6*  --   --   HGB 11.2* 10.4* 9.8*  HCT 35.1* 32.4* 30.8*  MCV 96.4 93.6 94.8  PLT 288 270 281    Cardiac Enzymes:  Recent Labs Lab 05/16/14 1955 05/17/14 0110 05/17/14 0718  TROPONINI <0.30 <0.30 <0.30    CBG:  Recent Labs Lab 05/16/14 2156 05/17/14 1159 05/17/14 1643 05/17/14 2157 05/18/14 0749  GLUCAP 136* 124* 118* 158* 196*    Iron Studies:   Recent Labs  05/17/14 1545  IRON <10*  TIBC Not calculated due to Iron <10.    Studies/Results: Dg Chest 2 View  05/16/2014   CLINICAL DATA:  Shortness of breath, cough, congestion.  EXAM: CHEST  2 VIEW  COMPARISON:  04/24/2014  FINDINGS: Prior CABG. Heart is upper limits normal size. Diffuse interstitial prominence throughout the lungs, most pronounced in the lower lobes where there are more confluent opacities. Small bilateral pleural effusions.  No acute bony abnormality.  IMPRESSION: Increasing interstitial prominence and bibasilar opacities. Small bilateral effusions. Findings could represent edema or  infection.   Electronically Signed   By: Rolm Baptise M.D.   On: 05/16/2014 17:16   . antiseptic oral rinse  15 mL Mouth Rinse BID  . aspirin EC  81 mg Oral Daily  . atorvastatin  80 mg Oral q1800  . ceFEPime (MAXIPIME) IV  500 mg Intravenous Q24H  . clopidogrel  75 mg Oral Q breakfast  . doxazosin  4 mg Oral Daily  . enoxaparin (LOVENOX) injection  30 mg Subcutaneous Q24H  . feeding supplement (ENSURE COMPLETE)  237 mL Oral TID BM  . furosemide      . furosemide  120 mg Intravenous Once  . insulin aspart  0-9 Units Subcutaneous TID WC  . ipratropium-albuterol  3 mL Nebulization TID  . isosorbide mononitrate  60 mg Oral Daily  . mometasone-formoterol  2 puff Inhalation BID  . multivitamin  1 tablet Oral QHS  . pantoprazole  40 mg  Oral Daily  . paricalcitol  1 mcg Oral QODAY  . sevelamer carbonate  800 mg Oral TID WC  . sodium chloride  3 mL Intravenous Q12H  . vancomycin  1,000 mg Intravenous Q48H    BMET:    Component Value Date/Time   NA 138 05/18/2014 0330   K 4.5 05/18/2014 0330   CL 96 05/18/2014 0330   CO2 21 05/18/2014 0330   GLUCOSE 210* 05/18/2014 0330   BUN 81* 05/18/2014 0330   CREATININE 6.30* 05/18/2014 0330   CALCIUM 9.5 05/18/2014 0330   GFRNONAA 7* 05/18/2014 0330   GFRAA 9* 05/18/2014 0330    CBC:    Component Value Date/Time   WBC 19.5* 05/18/2014 0330   RBC 3.25* 05/18/2014 0330   HGB 9.8* 05/18/2014 0330   HCT 30.8* 05/18/2014 0330   PLT 281 05/18/2014 0330   MCV 94.8 05/18/2014 0330   MCH 30.2 05/18/2014 0330   MCHC 31.8 05/18/2014 0330   RDW 15.8* 05/18/2014 0330   LYMPHSABS 1.2 05/16/2014 1601   MONOABS 1.1* 05/16/2014 1601   EOSABS 0.1 05/16/2014 1601   BASOSABS 0.0 05/16/2014 1601     Assessment/Plan:  CKD Stage V-  Baseline creatinine ~5's. Pt is followed by Dr. Moshe Cipro.  Cr 6.3 today.   Weight down from 150 on admission-->144 lbs yesterday.  Good UOP 1.1L s/p 160mg  lasix.  Pt to receive HD today after temp HD catheter placement by Dr. Donnetta Hutching.  -continue sevelamer 800mg  tid with meals  -hold imdur 60mg , doxazosin 4mg  daily  -continue zemplar, renvela -strict I/O, daily weights  -lasix 120mg  IV NOW -PTH pending  ?HCAP-  On vancomycin and cefepime  -mgmt per primary   Acute on chronic diastolic CHF -66/59/9357 LVEF 01-77%, grade 1 diastolic dysfunction, likely driven by acute infection.  -mgmt per cardiology   H/O-  -mgmt per primary  Anemia of chronic disease-  Iron <10. -would give feraheme, aranesp  DM-  mgmt per primary   Atrial fibrillation-  mgmt per cardiology    Jones Bales, MD Internal Medicine Teaching Service, PGY-2

## 2014-05-18 NOTE — Progress Notes (Signed)
Pt having some respiratory distress at this time.  Breathing is labored with abdominal retractions.  RR 27.  O2 sat 92-94% on 2L O2.  Lungs sound coarse with rhonchi.  Rounding internal med MD walks in room at this time.  Orders lasix iv.  Awaiting Lasix from pharmacy.

## 2014-05-18 NOTE — Consult Note (Signed)
ELECTROPHYSIOLOGY CONSULT NOTE    Patient ID: Raymond [name] MRN: [id number], DOB/AGE: 02/09/1930 80 y.o.  Admit date: 05/16/2014 Date of Consult: 05-18-14  Primary Physician: Milagros Evener, MD Primary Cardiologist: Tamala Julian  Reason for Consultation: heart block  HPI:  Raymond [name] is a 80 y.o. male with a past medical history significant for diabetes, moderate AS, hypertension, CAD s/p CABG and recent intervention (04-2014), chronic foley, chronic kidney disease - stage V and chronic debility.  He presented to the hospital on 05-16-2014 with increased shortness of breath.  He was found to be fluid overloaded and in heart block.  He was also found to have pneumonia and has been treated with vancomycin.  His beta blocker was discontinued.  His renal function has declined and a temporary HD catheter was placed in the left IJ today with plans for HD today.  He has had persistent heart block, but with some improvement in conduction. EP has been asked to evaluate for treatment options.   Echocardiogram 04-2014 demonstrated EF 70-62%, grade 1 diastolic dysfunction, severe AS, severe MAC.    He has chronic shortness of breath and exercise intolerance.  He has not had significant chest pain since intervention in June 2015.  He has not had dizziness, or frank syncope.  ROS is otherwise negative.   Past Medical History  Diagnosis Date  . COPD (chronic obstructive pulmonary disease)   . Hypertension   . CAD (coronary artery disease), autologous vein bypass graft   . High cholesterol   . Acute chest pain 08/18/2012    "cardiologist said it was not my heart" (08/18/2012)  . Shortness of breath     "related to the COPD" (08/18/2012)  . Type II diabetes mellitus   . History of blood transfusion 1938  . Colon cancer   . Chronic kidney disease, stage 4 (severe)   . Arthritis     "back, hands, neck" (08/18/2012)  . History of pancreatitis ~ 1985  . Anginal pain   . Impotence of organic  origin   . Type I (juvenile type) diabetes mellitus without mention of complication, not stated as uncontrolled   . On home oxygen therapy     "2L at night & prn" (05/16/2014)  . Pneumonia 1938  . HCAP (healthcare-associated pneumonia) 05/16/2014     Surgical History:  Past Surgical History  Procedure Laterality Date  . Appendectomy  1938  . Inguinal hernia repair  2000's    bilaterally  . Cataract extraction w/ intraocular lens  implant, bilateral  2000's  . Coronary artery bypass graft  2000's    CABG X4  . Colectomy  2000's    "cancer; prior to having double hernia operation" (08/18/2012)     Prescriptions prior to admission  Medication Sig Dispense Refill  . albuterol (PROVENTIL HFA;VENTOLIN HFA) 108 (90 BASE) MCG/ACT inhaler Inhale 2 puffs into the lungs every 6 (six) hours as needed. For wheeze or shortness of breath        . aspirin EC 81 MG tablet Take 81 mg by mouth daily.        Marland Kitchen atorvastatin (LIPITOR) 80 MG tablet Take 1 tablet (80 mg total) by mouth daily at 6 PM.  30 tablet  11  . clopidogrel (PLAVIX) 75 MG tablet Take 1 tablet (75 mg total) by mouth daily with breakfast.  30 tablet  11  . epoetin alfa (EPOGEN,PROCRIT) 37628 UNIT/ML injection Inject 20,000 Units into the skin every 14 (fourteen) days.       Marland Kitchen  Fluticasone-Salmeterol (ADVAIR) 100-50 MCG/DOSE AEPB Inhale 1 puff into the lungs every 12 (twelve) hours.      . furosemide (LASIX) 80 MG tablet Take 80 mg by mouth 2 (two) times daily.      Marland Kitchen GLUCAGON EMERGENCY 1 MG injection       . insulin aspart (NOVOLOG) 100 UNIT/ML injection Inject 3-4 Units into the skin 2 (two) times daily. 3 units at breakfast, 4 units at dinner iIf dinner contains starches. If dinner is a small meal, use 3 units. If blood sugar is over 200 before breakfast or before dinner, add an additional 1 unit.      Marland Kitchen insulin NPH (HUMULIN N,NOVOLIN N) 100 UNIT/ML injection Inject 20 Units into the skin daily before breakfast.       . isosorbide  mononitrate (IMDUR) 60 MG 24 hr tablet Take 60 mg by mouth daily.      . lipase/protease/amylase (CREON-10/PANCREASE) 12000 UNITS CPEP 2 capsules 3 (three) times daily with meals. 2 before meals (6 daily)      . metoprolol (LOPRESSOR) 50 MG tablet Take 50 mg by mouth 2 (two) times daily.      . Multiple Vitamins-Minerals (MULTIVITAMINS THER. W/MINERALS) TABS Take 1 tablet by mouth daily.        . nitroGLYCERIN (NITROSTAT) 0.4 MG SL tablet Place 1 tablet (0.4 mg total) under the tongue every 5 (five) minutes as needed for chest pain.  25 tablet  1  . omeprazole (PRILOSEC) 20 MG capsule Take 20 mg by mouth daily.        . paricalcitol (ZEMPLAR) 1 MCG capsule Take 1 mcg by mouth every other day.       . quiNINE (QUALAQUIN) 324 MG capsule 2 caps by mouth every evening      . sevelamer carbonate (RENVELA) 800 MG tablet Take 1 tablet (800 mg total) by mouth 3 (three) times daily with meals.  90 tablet  11  . tiotropium (SPIRIVA) 18 MCG inhalation capsule Place 18 mcg into inhaler and inhale daily.      . traMADol (ULTRAM) 50 MG tablet Take 50 mg by mouth every 6 (six) hours as needed for moderate pain. Maximum dose= 8 tablets per day      . docusate sodium (COLACE) 100 MG capsule Take 100 mg by mouth daily.      Marland Kitchen doxazosin (CARDURA) 4 MG tablet Take 4 mg by mouth daily.        Inpatient Medications:  . antiseptic oral rinse  15 mL Mouth Rinse BID  . aspirin EC  81 mg Oral Daily  . atorvastatin  80 mg Oral q1800  . ceFEPime (MAXIPIME) IV  500 mg Intravenous Q24H  . clopidogrel  75 mg Oral Q breakfast  . doxazosin  4 mg Oral Daily  . enoxaparin (LOVENOX) injection  30 mg Subcutaneous Q24H  . feeding supplement (ENSURE COMPLETE)  237 mL Oral TID BM  . furosemide      . insulin aspart  0-9 Units Subcutaneous TID WC  . ipratropium-albuterol  3 mL Nebulization TID  . isosorbide mononitrate  60 mg Oral Daily  . mometasone-formoterol  2 puff Inhalation BID  . multivitamin  1 tablet Oral QHS  .  pantoprazole  40 mg Oral Daily  . paricalcitol  1 mcg Oral QODAY  . sevelamer carbonate  800 mg Oral TID WC  . sodium chloride  3 mL Intravenous Q12H  . vancomycin  1,000 mg Intravenous Q48H    Allergies:  Allergies  Allergen Reactions  . Codeine Nausea And Vomiting  . Dilaudid [Hydromorphone Hcl] Nausea And Vomiting    "I get deathly sick"; diaphoresis    History   Social History  . Marital Status: Married    Spouse Name: N/A    Number of Children: N/A  . Years of Education: N/A   Occupational History  . Not on file.   Social History Main Topics  . Smoking status: Former Smoker -- 4.00 packs/day for 30 years    Types: Cigarettes    Quit date: 07/13/1983  . Smokeless tobacco: Never Used  . Alcohol Use: Yes     Comment: 08/18/2012 "drank too much; stopped ~ 1985"  . Drug Use: No  . Sexual Activity: No   Other Topics Concern  . Not on file   Social History Narrative  . No narrative on file     Family History  Problem Relation Age of Onset  . Heart disease Mother   . Heart disease Father     BP 150/44  Pulse 49  Temp(Src) 98.5 F (36.9 C) (Oral)  Resp 23  Ht 5\' 6"  (1.676 m)  Wt 144 lb 10 oz (65.6 kg)  BMI 23.35 kg/m2  SpO2 82%  Physical Exam: Tele- reveals frequent AV dissociation with intermittent second degree AV block  Filed Vitals:   05/18/14 1916 05/18/14 1930 05/18/14 1939 05/18/14 1940  BP: 134/44 153/36 153/36   Pulse: 58 58 75   Temp:   97.8 F (36.6 C)   TempSrc:   Axillary   Resp: 31 33 30   Height:      Weight:   137 lb 12.6 oz (62.5 kg)   SpO2: 92% 96% 100% 100%    GEN- The patient is chronically ill appearing, alert but unable to provide full medical history, defers to his wife who is present at the bedside Head- normocephalic, atraumatic Eyes-  Sclera clear, conjunctiva pink Ears- hearing intact Oropharynx- clear Neck- supple, Lungs- decreased BS, normal work of breathing Heart- bradycardic regular rhythm rhythm, 3/6 SEM  LUSB (mid peaking) GI- soft, NT, ND, + BS Extremities- no clubbing, cyanosis, + dependant edema MS- diffuse muscle atrophy Skin- no rash or lesion Psych- poor historian, confused    Labs:   Lab Results  Component Value Date   WBC 19.5* 05/18/2014   HGB 9.8* 05/18/2014   HCT 30.8* 05/18/2014   MCV 94.8 05/18/2014   PLT 281 05/18/2014    Recent Labs Lab 05/18/14 0330  NA 138  K 4.5  CL 96  CO2 21  BUN 81*  CREATININE 6.30*  CALCIUM 9.5  PROT 6.1  BILITOT 0.3  ALKPHOS 75  ALT 9  AST 20  GLUCOSE 210*    Radiology/Studies: Dg Chest 2 View 05/16/2014   CLINICAL DATA:  Shortness of breath, cough, congestion.  EXAM: CHEST  2 VIEW  COMPARISON:  04/24/2014  FINDINGS: Prior CABG. Heart is upper limits normal size. Diffuse interstitial prominence throughout the lungs, most pronounced in the lower lobes where there are more confluent opacities. Small bilateral pleural effusions.  No acute bony abnormality.  IMPRESSION: Increasing interstitial prominence and bibasilar opacities. Small bilateral effusions. Findings could represent edema or infection.   Electronically Signed   By: Rolm Baptise M.D.   On: 05/16/2014 17:16    EKG: sinus rhythm with transient complete heart block  A/P  1. AV block The patient has advanced AV block.  He has frequent mobitz II second degree AV block  as well as transient complete heart block.  He is presently hemodynamically stable but very ill with multiple acute and chronic medical issues ongoing.  He has been initiated on dialysis temporarily.  He also appears to have pneumonia.  He is chronically very ill and his functional status is poor. Metoprolol continues to wash out but with little improvement on AV conduction.  I had a long discussion with the patient, his wife, and their friend who appears to be an active part of their support network.  They understand his poor prognosis and also that he is very high risk for procedures including pacemaker  implantation.  Present indwelling catheters, respiratory failure, and renal disease make pacemaker implantation technically very challenging.  They are clear at this time that they would favor a more palliative approach. The patients wife has been having outpatient conversations with primary care (Dr Zadie Rhine) about comfort measures previously.  Their son is coming to [facility] tomorrow.  The patient and his family would find consultation with the palliative care team appropriate at this time.  I will therefore ask for palliative care to assist with further decision making.  The patient and his family are clear that they do not wish to proceed with PPM implantation presently.  Given their wishes and the patients age as well as multiple acute and chronic issues, I think that this is very appropriate.  General cardiology to assist with care over the weekend.  EP to see as needed

## 2014-05-18 NOTE — Progress Notes (Signed)
Pt c/o "not being able to breath".  O2 sat 93% on 4L O2 via n.c.  HD going at this time.  Lungs have scattered rhonchi with inspiratory wheezing.  Placed pt on ventimask 55% d/t mouth breathing and continued dyspnea O2 sat 95%.  Notified RT who is bringing breathing treatment.  NTS done at this time for a moderate amt of thick whitish tan secretions noted.  Pt states, "doesn't seem to be gurgling as much now".  O2 sats increased to 97%.  Will continue to monitor for acute distress.

## 2014-05-18 NOTE — Progress Notes (Addendum)
Pt's breathing doesn't seem to be as labored.  However, still having some dyspnea.  Awaiting lasix gtt from pharmacy.  Paged Dr. Posey Pronto (nephrology) at this time to see approximately what time pt will get hd catheter/hd

## 2014-05-18 NOTE — Progress Notes (Signed)
ANTIBIOTIC CONSULT NOTE - FOLLOW UP  Pharmacy Consult for Vancomycin and Cefepime Indication: pneumonia  Allergies  Allergen Reactions  . Codeine Nausea And Vomiting  . Dilaudid [Hydromorphone Hcl] Nausea And Vomiting    "I get deathly sick"; diaphoresis    Patient Measurements: Height: 5\' 6"  (167.6 cm) Weight: 144 lb 10 oz (65.6 kg) IBW/kg (Calculated) : 63.8  Vital Signs: Temp: 98.5 F (36.9 C) (07/10 1200) Temp src: Oral (07/10 1200) BP: 141/37 mmHg (07/10 1200) Pulse Rate: 48 (07/10 1200) Intake/Output from previous day: 07/09 0701 - 07/10 0700 In: 113 [I.V.:3; IV Piggyback:100] Out: 1100 [Urine:1100] Intake/Output from this shift: Total I/O In: 20 [Other:20] Out: -   Labs:  Recent Labs  05/16/14 1601 05/17/14 0110 05/18/14 0330  WBC 22.0* 23.7* 19.5*  HGB 11.2* 10.4* 9.8*  PLT 288 270 281  CREATININE 5.73* 5.73* 6.30*   Estimated Creatinine Clearance: 8.2 ml/min (by C-G formula based on Cr of 6.3).  Microbiology: Recent Results (from the past 720 hour(s))  URINE CULTURE     Status: None   Collection Time    04/21/14  8:15 AM      Result Value Ref Range Status   Specimen Description URINE, CATHETERIZED   Final   Special Requests Normal   Final   Culture  Setup Time     Final   Value: 04/21/2014 18:42     Performed at [place]     Final   Value: >=100,000 COLONIES/ML     Performed at [facility]   Culture     Final   Value: Multiple bacterial morphotypes present, none predominant. Suggest appropriate recollection if clinically indicated.     Performed at [facility]   Report Status 04/23/2014 FINAL   Final  URINE CULTURE     Status: None   Collection Time    04/24/14 10:44 AM      Result Value Ref Range Status   Specimen Description URINE, CATHETERIZED   Final   Special Requests NONE   Final   Culture  Setup Time     Final   Value: 04/24/2014 15:36     Performed at [place]     Final   Value: >=100,000 COLONIES/ML     Performed at [facility]   Culture     Final   Value: Multiple bacterial morphotypes present, none predominant. Suggest appropriate recollection if clinically indicated.     Performed at [facility]   Report Status 04/25/2014 FINAL   Final  URINE CULTURE     Status: None   Collection Time    04/28/14  4:42 PM      Result Value Ref Range Status   Specimen Description URINE, CATHETERIZED   Final   Special Requests NONE   Final   Culture  Setup Time     Final   Value: 04/29/2014 15:11     Performed at [place]     Final   Value: >=100,000 COLONIES/ML     Performed at [facility]   Culture     Final   Value: CITROBACTER FREUNDII     Performed at [facility]   Report Status 05/01/2014 FINAL   Final   Organism ID, Bacteria CITROBACTER FREUNDII   Final  URINE CULTURE     Status: None   Collection Time    05/16/14  5:39  PM      Result Value Ref Range Status   Specimen Description URINE, CATHETERIZED   Final   Special Requests NONE   Final   Culture  Setup Time     Final   Value: 05/16/2014 18:04     Performed at [place]     Final   Value: >=100,000 COLONIES/ML     Performed at [facility]   Culture     Final   Value: Multiple bacterial morphotypes present, none predominant. Suggest appropriate recollection if clinically indicated.     Performed at [facility]   Report Status 05/17/2014 FINAL   Final  CULTURE, BLOOD (ROUTINE X 2)     Status: None   Collection Time    05/16/14  5:40 PM      Result Value Ref Range Status   Specimen Description BLOOD RIGHT ARM   Final   Special Requests BOTTLES DRAWN AEROBIC AND ANAEROBIC [facility]   Final   Culture  Setup Time     Final   Value: 05/16/2014 21:56     Performed at [facility]   Culture     Final   Value:        BLOOD CULTURE RECEIVED NO GROWTH TO DATE CULTURE WILL BE  HELD FOR 5 DAYS BEFORE ISSUING A FINAL NEGATIVE REPORT     Performed at [facility]   Report Status PENDING   Incomplete  CULTURE, BLOOD (ROUTINE X 2)     Status: None   Collection Time    05/16/14  5:55 PM      Result Value Ref Range Status   Specimen Description BLOOD LEFT WRIST   Final   Special Requests BOTTLES DRAWN AEROBIC AND ANAEROBIC 5CC   Final   Culture  Setup Time     Final   Value: 05/16/2014 21:56     Performed at [facility]   Culture     Final   Value:        BLOOD CULTURE RECEIVED NO GROWTH TO DATE CULTURE WILL BE HELD FOR 5 DAYS BEFORE ISSUING A FINAL NEGATIVE REPORT     Performed at [facility]   Report Status PENDING   Incomplete  MRSA PCR SCREENING     Status: None   Collection Time    05/17/14 10:48 AM      Result Value Ref Range Status   MRSA by PCR NEGATIVE  NEGATIVE Final   Comment:            The GeneXpert MRSA Assay (FDA     approved for NASAL specimens     only), is one component of a     comprehensive MRSA colonization     surveillance program. It is not     intended to diagnose MRSA     infection nor to guide or     monitor treatment for     MRSA infections.    Anti-infectives   Start     Dose/Rate Route Frequency Ordered Stop   05/16/14 1800  vancomycin (VANCOCIN) IVPB 1000 mg/200 mL premix     1,000 mg 200 mL/hr over 60 Minutes Intravenous Every 48 hours 05/16/14 1736     05/16/14 1800  ceFEPIme (MAXIPIME) 500 mg in dextrose 5 % 50 mL IVPB     500 mg 100 mL/hr over 30 Minutes Intravenous Every 24 hours 05/16/14 1736        Assessment:  Day # 3 Vancomycin and Cefepime for HCAP coverage. For first hemodialysis session later today after catheter placed.  Cefepime 500 mg IV q24hrs is scheduled for 8pm daily.  Second dose of Vancomycin 1 gram IV q48hrs due tonight.  Goal of Therapy:  Vancomycin trough level 15-20 mcg/ml, pre-HD levels of 15-25 mcg/ml Appropriate Cefepime dose for renal function and  infection  Plan:   Continue Cefepime 500 mg IV q24hrs and  Vancomycin 1 gram IV q48hrs for now.  Tonight's Vancomycin dose moved from 6pm to 10pm, to be sure it's given after dialysis today.   Will follow up for HD tolerance and for subsequent HD plans.  Arty Baumgartner, [place] Pager: [phone] 05/18/2014,12:46 PM

## 2014-05-19 DIAGNOSIS — N179 Acute kidney failure, unspecified: Secondary | ICD-10-CM

## 2014-05-19 DIAGNOSIS — N189 Chronic kidney disease, unspecified: Secondary | ICD-10-CM

## 2014-05-19 LAB — GLUCOSE, CAPILLARY
Glucose-Capillary: 115 mg/dL — ABNORMAL HIGH (ref 70–99)
Glucose-Capillary: 130 mg/dL — ABNORMAL HIGH (ref 70–99)
Glucose-Capillary: 147 mg/dL — ABNORMAL HIGH (ref 70–99)
Glucose-Capillary: 150 mg/dL — ABNORMAL HIGH (ref 70–99)
Glucose-Capillary: 179 mg/dL — ABNORMAL HIGH (ref 70–99)

## 2014-05-19 MED ORDER — NALOXONE HCL 0.4 MG/ML IJ SOLN
0.4000 mg | INTRAMUSCULAR | Status: DC | PRN
  Administered 2014-05-19: 0.4 mg via INTRAVENOUS

## 2014-05-19 MED ORDER — NALOXONE NEWBORN-WH INJECTION 0.4 MG/ML
0.4000 mg | Freq: Once | INTRAMUSCULAR | Status: AC
  Administered 2014-05-19: 0.4 mg via INTRAMUSCULAR

## 2014-05-19 MED ORDER — FENTANYL CITRATE 0.05 MG/ML IJ SOLN: 25.0000 ug | INTRAMUSCULAR | Status: DC | PRN

## 2014-05-19 MED ORDER — NALOXONE HCL 0.4 MG/ML IJ SOLN
INTRAMUSCULAR | Status: AC
  Filled 2014-05-19: qty 1

## 2014-05-19 MED ORDER — CHLORHEXIDINE GLUCONATE 0.12 % MT SOLN
15.0000 mL | Freq: Two times a day (BID) | OROMUCOSAL | Status: DC
  Administered 2014-05-19 – 2014-05-20 (×2): 15 mL via OROMUCOSAL
  Filled 2014-05-19 (×2): qty 15

## 2014-05-19 MED ORDER — BIOTENE DRY MOUTH MT LIQD
15.0000 mL | Freq: Two times a day (BID) | OROMUCOSAL | Status: DC
  Administered 2014-05-19 – 2014-05-22 (×8): 15 mL via OROMUCOSAL

## 2014-05-19 NOTE — Progress Notes (Signed)
SLP Cancellation Note  Patient Details Name: Raymond [name] MRN: [id number] DOB: 10-Mar-1930   Cancelled treatment:       Reason Eval/Treat Not Completed: Medical issues which prohibited therapy.  Chart reviewed; pt currently on BiPAP; palliative care following. Will follow to determine if our services will be beneficial.    Juan Quam Laurice 05/19/2014, 2:10 PM

## 2014-05-19 NOTE — Progress Notes (Signed)
Progress Note from the Palliative Medicine Team at [place]: Respiratory decline overnight continued with need for BiPAP. Able to give yes/no answers to only a few questions. Denies pain or respiratory distress.  Spoke with wife, daughter in law (who is NP) and gradchildren at length.       Objective: Allergies  Allergen Reactions  . Codeine Nausea And Vomiting  . Dilaudid [Hydromorphone Hcl] Nausea And Vomiting    "I get deathly sick"; diaphoresis   Scheduled Meds: . antiseptic oral rinse  15 mL Mouth Rinse BID  . antiseptic oral rinse  15 mL Mouth Rinse q12n4p  . aspirin EC  81 mg Oral Daily  . atorvastatin  80 mg Oral q1800  . ceFEPime (MAXIPIME) IV  500 mg Intravenous Q24H  . chlorhexidine  15 mL Mouth Rinse BID  . clopidogrel  75 mg Oral Q breakfast  . doxazosin  4 mg Oral Daily  . enoxaparin (LOVENOX) injection  30 mg Subcutaneous Q24H  . feeding supplement (ENSURE COMPLETE)  237 mL Oral TID BM  . insulin aspart  0-9 Units Subcutaneous TID WC  . ipratropium-albuterol  3 mL Nebulization TID  . isosorbide mononitrate  60 mg Oral Daily  . mometasone-formoterol  2 puff Inhalation BID  . multivitamin  1 tablet Oral QHS  . pantoprazole  40 mg Oral Daily  . paricalcitol  1 mcg Oral QODAY  . sevelamer carbonate  800 mg Oral TID WC  . sodium chloride  3 mL Intravenous Q12H  . vancomycin  1,000 mg Intravenous Q48H   Continuous Infusions:  PRN Meds:.acetaminophen, acetaminophen, albuterol, morphine injection, ondansetron (ZOFRAN) IV, ondansetron, traMADol  BP 149/45  Pulse 49  Temp(Src) 98.3 F (36.8 C) (Axillary)  Resp 23  Ht 5\' 6"  (1.676 m)  Wt 62.5 kg (137 lb 12.6 oz)  BMI 22.25 kg/m2  SpO2 99%   PPS: 10    Intake/Output Summary (Last 24 hours) at 05/19/14 1342 Last data filed at 05/19/14 0700  Gross per 24 hour  Intake    500 ml  Output   3225 ml  Net  -2725 ml    Physical Exam:  General: *Drowsy, but arouses appropriately to voice, on  continuous BiPAP HEENT:  [place], sclera anicteric Chest:   Scattered coarse breath sounds, symm expan CVS: bradycardic Abdomen: soft, NT Ext: warm/dry  Labs: CBC    Component Value Date/Time   WBC 19.5* 05/18/2014 0330   RBC 3.25* 05/18/2014 0330   HGB 9.8* 05/18/2014 0330   HCT 30.8* 05/18/2014 0330   PLT 281 05/18/2014 0330   MCV 94.8 05/18/2014 0330   MCH 30.2 05/18/2014 0330   MCHC 31.8 05/18/2014 0330   RDW 15.8* 05/18/2014 0330   LYMPHSABS 1.2 05/16/2014 1601   MONOABS 1.1* 05/16/2014 1601   EOSABS 0.1 05/16/2014 1601   BASOSABS 0.0 05/16/2014 1601    BMET    Component Value Date/Time   NA 138 05/18/2014 0330   K 4.5 05/18/2014 0330   CL 96 05/18/2014 0330   CO2 21 05/18/2014 0330   GLUCOSE 210* 05/18/2014 0330   BUN 81* 05/18/2014 0330   CREATININE 6.30* 05/18/2014 0330   CALCIUM 9.5 05/18/2014 0330   GFRNONAA 7* 05/18/2014 0330   GFRAA 9* 05/18/2014 0330    CMP     Component Value Date/Time   NA 138 05/18/2014 0330   K 4.5 05/18/2014 0330   CL 96 05/18/2014 0330   CO2 21 05/18/2014 0330   GLUCOSE 210* 05/18/2014  0330   BUN 81* 05/18/2014 0330   CREATININE 6.30* 05/18/2014 0330   CALCIUM 9.5 05/18/2014 0330   PROT 6.1 05/18/2014 0330   ALBUMIN 2.2* 05/18/2014 0330   AST 20 05/18/2014 0330   ALT 9 05/18/2014 0330   ALKPHOS 75 05/18/2014 0330   BILITOT 0.3 05/18/2014 0330   GFRNONAA 7* 05/18/2014 0330   GFRAA 9* 05/18/2014 0330      1  Assessment/Recommendations:  80 yo male with multiple medical problems including CKD V with AKI, Severe AS, Diastolic Dysfx, HTN, DM who presented on 05/16/14 with SOB. Found to have suspected HCAP with worsening renal function necessitating dialysis, complete heart block, and acute respiratory failure.  Goals of Care:  1. Code Status: DNR   2. Goals of Care-  Continued resp decline overnight requiring BiPAP. Wife, daughter in law (NP) as well as grandhcildren at bedside.  I was present for some of her conversation with Dr Wynonia Lawman from Cardiology as well.  Wife has also had time to speak with nephrology as well.  Her goal is to give dialysis one more try to see if fluid can be removed and improve his breathing for time being, but not pursue long term dialysis.  She understands that if this is successful, fluid accumulation likely to reoccur in coming days and her goals would be to focus on comfort.  She is also aware, that despite this attempt he may continue to decline and care would switch to pure comfort as he would be unable to be maintained on BiPAP long term.  We would use opioid medications to help ensure comfort and controlled breathing/symptoms in that case.  If not tolerating dialysis or improved breathing with volume removal suspect prognosis would be hours to days with likely in-hospital death.  If can achieve some respiratory stability, we could explore inpatient hospice facility for his care.    3. Symptom Management:  1. Dyspnea- attempts at volume removal through dialysis given undelrying clinical scenario. I will switch morphine to fentanyl given his renal dysfunction (fentanyl safer and effective for pain/dyspnea). Dyspnea currently improved with BiPAP -If not tolerating dialysis and need to wean from bipap, would bolus 16mcg fentanyl and start fentanyl infusion @25mcg /hr.  Would also provide ativan 1mg  q1h PRN dyspnea/anxiety. Would be happy to assist with drip titration if needed.   4. Psychosocial: Lives at home with his wife whom is hard of hearing but able to read lips well. They also receive help from family friend Alberteen Sam whom is a PA.     Time In Time Out Total Time Spent with Patient Total Overall Time  1145 1215 20 min 30 min    Greater than 50%  of this time was spent counseling and coordinating care related to the above assessment and plan.   Doran Clay D.O. Palliative Medicine Team at [facility]  Team Phone: [phone]

## 2014-05-19 NOTE — Progress Notes (Signed)
Narcan 0.4mg  given as ordered. Minimal improvement of MS noted. Pt will nod head to yes/no questions, but will not verbalize. Is weak t/o and follows only simple one step commands. PA paged to notify.

## 2014-05-19 NOTE — Progress Notes (Signed)
Patient ID: Raymond [name] male   DOB: [birth year] 80 y.o.   MRN: [id number]  S: Family at bedside. Says no improvement in mental status, but pt got a dose of morphine yesterday and has been sedated since then. Pt was a bit more conversational yesterday. On Bipap.   O:  VS: BP 162/31  Pulse 45  Temp(Src) 98 F (36.7 C) (Oral)  Resp 19  Ht 5\' 6"  (1.676 m)  Wt 137 lb 12.6 oz (62.5 kg)  BMI 22.25 kg/m2  SpO2 100%  Intake/Output:  Intake/Output Summary (Last 24 hours) at 05/19/14 0940 Last data filed at 05/19/14 0700  Gross per 24 hour  Intake    540 ml  Output   3225 ml  Net  -2685 ml    Weight change: Filed Weights   05/17/14 1030 05/18/14 1630 05/18/14 1939  Weight: 144 lb 10 oz (65.6 kg) 144 lb 2.9 oz (65.4 kg) 137 lb 12.6 oz (62.5 kg)    Physical Exam: Constitutional: Vital signs reviewed. Elderly male appears acutely ill, pale, responds to some commands HEENT: Pale, on Bipap.  Neck: Supple, trachea midline Cardiovascular: irregular, no added sounds appreciated.  Pulmonary/Chest: Breath sounds Coarse on Bipap, no crackles or wheezes. Abdominal: Soft. Non-tender, non-distended, bowel sounds are normal  Extremities: No pedal edema.  Neurological: Alert, moves all extremities voluntarily, but strength globally reduced, asterixis- Not done, pt did not cooperate. Skin: Warm, dry and intact. No rash.   Recent Labs Lab 05/16/14 1601 05/17/14 0110 05/18/14 0330  NA 135* 134* 138  K 4.5 4.1 4.5  CL 95* 95* 96  CO2 22 20 21   GLUCOSE 137* 115* 210*  BUN 73* 73* 81*  CREATININE 5.73* 5.73* 6.30*  ALBUMIN  --   --  2.2*  CALCIUM 9.8 9.5 9.5  AST  --   --  20  ALT  --   --  9    Liver Function Tests:  Recent Labs Lab 05/18/14 0330  AST 20  ALT 9  ALKPHOS 75  BILITOT 0.3  PROT 6.1  ALBUMIN 2.2*   CBC:  Recent Labs Lab 05/16/14 1601 05/17/14 0110 05/18/14 0330  WBC 22.0* 23.7* 19.5*  NEUTROABS 19.6*  --   --   HGB 11.2* 10.4* 9.8*  HCT 35.1* 32.4*  30.8*  MCV 96.4 93.6 94.8  PLT 288 270 281    Cardiac Enzymes:  Recent Labs Lab 05/16/14 1955 05/17/14 0110 05/17/14 0718  TROPONINI <0.30 <0.30 <0.30    CBG:  Recent Labs Lab 05/17/14 2157 05/18/14 0749 05/18/14 1153 05/18/14 1544 05/18/14 1720  GLUCAP 158* 196* 179* 149* 121*    Iron Studies:   Recent Labs  05/17/14 1545  IRON <10*  TIBC Not calculated due to Iron <10.    Studies/Results: Dg Chest Port 1 View  05/18/2014   CLINICAL DATA:   IMPRESSION: No pneumothorax following LEFT jugular line placement.  COPD changes with bibasilar atelectasis.   Electronically Signed   By: Lavonia Dana M.D.   On: 05/18/2014 14:44   . antiseptic oral rinse  15 mL Mouth Rinse BID  . antiseptic oral rinse  15 mL Mouth Rinse q12n4p  . aspirin EC  81 mg Oral Daily  . atorvastatin  80 mg Oral q1800  . ceFEPime (MAXIPIME) IV  500 mg Intravenous Q24H  . chlorhexidine  15 mL Mouth Rinse BID  . clopidogrel  75 mg Oral Q breakfast  . doxazosin  4 mg Oral Daily  . enoxaparin (LOVENOX)  injection  30 mg Subcutaneous Q24H  . feeding supplement (ENSURE COMPLETE)  237 mL Oral TID BM  . insulin aspart  0-9 Units Subcutaneous TID WC  . ipratropium-albuterol  3 mL Nebulization TID  . isosorbide mononitrate  60 mg Oral Daily  . mometasone-formoterol  2 puff Inhalation BID  . multivitamin  1 tablet Oral QHS  . pantoprazole  40 mg Oral Daily  . paricalcitol  1 mcg Oral QODAY  . sevelamer carbonate  800 mg Oral TID WC  . sodium chloride  3 mL Intravenous Q12H  . vancomycin  1,000 mg Intravenous Q48H    BMET:    Component Value Date/Time   NA 138 05/18/2014 0330   K 4.5 05/18/2014 0330   CL 96 05/18/2014 0330   CO2 21 05/18/2014 0330   GLUCOSE 210* 05/18/2014 0330   BUN 81* 05/18/2014 0330   CREATININE 6.30* 05/18/2014 0330   CALCIUM 9.5 05/18/2014 0330   GFRNONAA 7* 05/18/2014 0330   GFRAA 9* 05/18/2014 0330    CBC:    Component Value Date/Time   WBC 19.5* 05/18/2014 0330   RBC 3.25*  05/18/2014 0330   HGB 9.8* 05/18/2014 0330   HCT 30.8* 05/18/2014 0330   PLT 281 05/18/2014 0330   MCV 94.8 05/18/2014 0330   MCH 30.2 05/18/2014 0330   MCHC 31.8 05/18/2014 0330   RDW 15.8* 05/18/2014 0330   LYMPHSABS 1.2 05/16/2014 1601   MONOABS 1.1* 05/16/2014 1601   EOSABS 0.1 05/16/2014 1601   BASOSABS 0.0 05/16/2014 1601     Assessment/Plan:  CKD Stage V-  Baseline creatinine ~5's. Pt is followed by Dr. Moshe Cipro.  Cr 6.3 yesterday. HD yesterday after placement of a temporary HD cath. 2.5L of fluid fulled off. PTH- WNL- 51. - Benefit from a second HD session today.  -continue sevelamer 800mg  tid with meals  -continue zemplar, renvela - Consider reducing dose and frequency of Morphine, though pt got 1 dose of 1mg , it is renally cleared.    ?HCAP-  On vancomycin and cefepime  -mgmt per primary   Acute on chronic diastolic CHF -76/14/7092 LVEF 95-74%, grade 1 diastolic dysfunction, likely driven by acute infection.  -mgmt per cardiology   Anemia of chronic disease- Baseline hgb 9-12. Iron <10.  DM-  mgmt per primary   Atrial fibrillation-  mgmt per cardiology, family elected for palliative approach.   Jenetta Downer, MD Internal Medicine Teaching Service, PGY-2

## 2014-05-19 NOTE — Progress Notes (Signed)
[facility] PROGRESS NOTE  Raymond [name] VZD:[id number] DOB: 11-11-1929 DOA: 05/16/2014 PCP: Milagros Evener, MD  Assessment/Plan:   Complete heart block - Pt transferred to Cardiac Stepdown unit - Cardiology and EP specialist on board. Per EP specialist's note patient is not a good candidate for PPM placement given all his co morbidities, respiratory distress, and CKD stage V. - Palliative team consulted and [place] being determined.    Acute on chronic diastolic CHF (congestive heart failure), NYHA class 4 - Complicated by history of chronic kidney disease stage V.  - Nephrology on board and assisting with volume removal.    CKD (chronic kidney disease) stage 5, GFR less than 15 ml/min - Nephrology managing. HD started on 05/18/14  Diabetes mellitus - Patient currently n.p.o. but once able to eat would recommend diabetic diet. - Patient currently on sliding scale insulin - Given that patient is n.p.o. we'll adjust CBG monitoring to every 4 hours  HCAP - currently on broad spectrum antibiotics cefepime and vancomycin - My index of suspicion is that currently shortness of breath secondary to patient being fluid overloaded given his history of CHF (diastolic) in context of chronic kidney disease stage V. Patient is currently afebrile but currently I do not have any other good reasons of why his white blood cell count would be elevated above normal values. As such at this juncture favor continuing broad-spectrum antibiotics - Nurse reports suspicion for aspiration as such will place order for speech therapy evaluation.  Code Status: DNR Family Communication: discussed with wife at bedside  Disposition Plan: Palliative on board and plans are for residential hospice.   Consultants:  Cardiology  EP  Nephrology  Palliative care team  Antibiotics:  Cefepime and Vancomycin  HPI/Subjective: Pt required Bipap due to respiratory distress while on HD  yesterday.  Objective: Filed Vitals:   05/19/14 0900  BP: 151/50  Pulse: 49  Temp: 98 F (36.7 C)  Resp: 23    Intake/Output Summary (Last 24 hours) at 05/19/14 1049 Last data filed at 05/19/14 0700  Gross per 24 hour  Intake    530 ml  Output   3225 ml  Net  -2695 ml   Filed Weights   05/17/14 1030 05/18/14 1630 05/18/14 1939  Weight: 65.6 kg (144 lb 10 oz) 65.4 kg (144 lb 2.9 oz) 62.5 kg (137 lb 12.6 oz)    Exam:   General:  On bipap, arousable  Cardiovascular: + S1 and S2, no rubs  Respiratory: increased wob, on bipap, no wheezes, rales diffusely  Abdomen: soft, ND  Musculoskeletal: no clubbing   Data Reviewed: Basic Metabolic Panel:  Recent Labs Lab 05/16/14 1601 05/17/14 0110 05/18/14 0330  NA 135* 134* 138  K 4.5 4.1 4.5  CL 95* 95* 96  CO2 22 20 21   GLUCOSE 137* 115* 210*  BUN 73* 73* 81*  CREATININE 5.73* 5.73* 6.30*  CALCIUM 9.8 9.5 9.5  MG  --   --  2.3   Liver Function Tests:  Recent Labs Lab 05/18/14 0330  AST 20  ALT 9  ALKPHOS 75  BILITOT 0.3  PROT 6.1  ALBUMIN 2.2*   No results found for this basename: LIPASE, AMYLASE,  in the last 168 hours No results found for this basename: AMMONIA,  in the last 168 hours CBC:  Recent Labs Lab 05/16/14 1601 05/17/14 0110 05/18/14 0330  WBC 22.0* 23.7* 19.5*  NEUTROABS 19.6*  --   --   HGB 11.2* 10.4* 9.8*  HCT 35.1* 32.4*  30.8*  MCV 96.4 93.6 94.8  PLT 288 270 281   Cardiac Enzymes:  Recent Labs Lab 05/16/14 1955 05/17/14 0110 05/17/14 0718  TROPONINI <0.30 <0.30 <0.30   BNP (last 3 results)  Recent Labs  04/21/14 0845 05/16/14 1955  PROBNP 5741.0* 21955.0*   CBG:  Recent Labs Lab 05/17/14 2157 05/18/14 0749 05/18/14 1153 05/18/14 1544 05/18/14 1720  GLUCAP 158* 196* 179* 149* 121*    Recent Results (from the past 240 hour(s))  URINE CULTURE     Status: None   Collection Time    05/16/14  5:39 PM      Result Value Ref Range Status   Specimen  Description URINE, CATHETERIZED   Final   Special Requests NONE   Final   Culture  Setup Time     Final   Value: 05/16/2014 18:04     Performed at [place]     Final   Value: >=100,000 COLONIES/ML     Performed at [facility]   Culture     Final   Value: Multiple bacterial morphotypes present, none predominant. Suggest appropriate recollection if clinically indicated.     Performed at [facility]   Report Status 05/17/2014 FINAL   Final  CULTURE, BLOOD (ROUTINE X 2)     Status: None   Collection Time    05/16/14  5:40 PM      Result Value Ref Range Status   Specimen Description BLOOD RIGHT ARM   Final   Special Requests BOTTLES DRAWN AEROBIC AND ANAEROBIC [facility]   Final   Culture  Setup Time     Final   Value: 05/16/2014 21:56     Performed at [facility]   Culture     Final   Value:        BLOOD CULTURE RECEIVED NO GROWTH TO DATE CULTURE WILL BE HELD FOR 5 DAYS BEFORE ISSUING A FINAL NEGATIVE REPORT     Performed at [facility]   Report Status PENDING   Incomplete  CULTURE, BLOOD (ROUTINE X 2)     Status: None   Collection Time    05/16/14  5:55 PM      Result Value Ref Range Status   Specimen Description BLOOD LEFT WRIST   Final   Special Requests BOTTLES DRAWN AEROBIC AND ANAEROBIC 5CC   Final   Culture  Setup Time     Final   Value: 05/16/2014 21:56     Performed at [facility]   Culture     Final   Value:        BLOOD CULTURE RECEIVED NO GROWTH TO DATE CULTURE WILL BE HELD FOR 5 DAYS BEFORE ISSUING A FINAL NEGATIVE REPORT     Performed at [facility]   Report Status PENDING   Incomplete  MRSA PCR SCREENING     Status: None   Collection Time    05/17/14 10:48 AM      Result Value Ref Range Status   MRSA by PCR NEGATIVE  NEGATIVE Final   Comment:            The GeneXpert MRSA Assay (FDA     approved for NASAL specimens     only), is one component of a     comprehensive MRSA colonization      surveillance program. It is not     intended to diagnose MRSA     infection nor to guide  or     monitor treatment for     MRSA infections.     Studies: Dg Chest Port 1 View  05/18/2014   CLINICAL DATA:  LEFT jugular line placement, history COPD, hypertension, coronary artery disease, diabetes, colon cancer  EXAM: PORTABLE CHEST - 1 VIEW  COMPARISON:  Portable exam 1427 hr compared to 05/16/2014  FINDINGS: New LEFT jugular central venous catheter with tip projecting over confluence of LEFT brachiocephalic vein and SVC.  Upper normal heart size post CABG.  Mediastinal contours and pulmonary vascularity normal.  Atherosclerotic calcification aorta.  COPD changes with bibasilar atelectasis versus infiltrate little changed.  Minimal RIGHT apical scarring.  No pleural effusion or pneumothorax.  Bones demineralized.  IMPRESSION: No pneumothorax following LEFT jugular line placement.  COPD changes with bibasilar atelectasis.   Electronically Signed   By: Lavonia Dana M.D.   On: 05/18/2014 14:44    Scheduled Meds: . antiseptic oral rinse  15 mL Mouth Rinse BID  . antiseptic oral rinse  15 mL Mouth Rinse q12n4p  . aspirin EC  81 mg Oral Daily  . atorvastatin  80 mg Oral q1800  . ceFEPime (MAXIPIME) IV  500 mg Intravenous Q24H  . chlorhexidine  15 mL Mouth Rinse BID  . clopidogrel  75 mg Oral Q breakfast  . doxazosin  4 mg Oral Daily  . enoxaparin (LOVENOX) injection  30 mg Subcutaneous Q24H  . feeding supplement (ENSURE COMPLETE)  237 mL Oral TID BM  . insulin aspart  0-9 Units Subcutaneous TID WC  . ipratropium-albuterol  3 mL Nebulization TID  . isosorbide mononitrate  60 mg Oral Daily  . mometasone-formoterol  2 puff Inhalation BID  . multivitamin  1 tablet Oral QHS  . pantoprazole  40 mg Oral Daily  . paricalcitol  1 mcg Oral QODAY  . sevelamer carbonate  800 mg Oral TID WC  . sodium chloride  3 mL Intravenous Q12H  . vancomycin  1,000 mg Intravenous Q48H   Continuous Infusions:     Time spent: > 35 minutes    Velvet Bathe  [facility] Pager [phone]. If 7PM-7AM, please contact night-coverage at [email or web address], password [facility] 05/19/2014, 10:49 AM  LOS: 3 days

## 2014-05-19 NOTE — Progress Notes (Signed)
Pt remains lethargic, on Bipap. PO meds held. Paged midlevel cardio practitioner to notify that pt will miss daily Plavix and ASA dose d/t safety issues.

## 2014-05-19 NOTE — Progress Notes (Signed)
Subjective:  Resting quietly with minimal dyspnea at rest.  Objective:  Vital Signs in the last 24 hours: BP 151/50  Pulse 49  Temp(Src) 98 F (36.7 C) (Oral)  Resp 23  Ht 5\' 6"  (1.676 m)  Wt 62.5 kg (137 lb 12.6 oz)  BMI 22.25 kg/m2  SpO2 99%  Physical Exam: Elderly WM on BIPAP resting quitely Lungs:  Reduced BS Cardiac: slow regular rhythm, normal S1 and S2, no S3   Intake/Output from previous day: 07/10 0701 - 07/11 0700 In: 560 [IV Piggyback:450] Out: 3225 [Urine:725] Weight Filed Weights   05/17/14 1030 05/18/14 1630 05/18/14 1939  Weight: 65.6 kg (144 lb 10 oz) 65.4 kg (144 lb 2.9 oz) 62.5 kg (137 lb 12.6 oz)    Lab Results: Basic Metabolic Panel:  Recent Labs  05/17/14 0110 05/18/14 0330  NA 134* 138  K 4.1 4.5  CL 95* 96  CO2 20 21  GLUCOSE 115* 210*  BUN 73* 81*  CREATININE 5.73* 6.30*    CBC:  Recent Labs  05/16/14 1601 05/17/14 0110 05/18/14 0330  WBC 22.0* 23.7* 19.5*  NEUTROABS 19.6*  --   --   HGB 11.2* 10.4* 9.8*  HCT 35.1* 32.4* 30.8*  MCV 96.4 93.6 94.8  PLT 288 270 281    BNP    Component Value Date/Time   PROBNP 21955.0* 05/16/2014 1955    PROTIME: Lab Results  Component Value Date   INR 1.15 04/21/2014    Telemetry: Complete heart block  Assessment/Plan:  1. Complete heart block not good candidate for perm pacer 2. Acute on Chronic diastolic CHF 3. Stage 5 CKD  Spoke with wife who mentions palliative care, DNR and one more try at dialysis to get him to a Hospice unit to keep him comfortable.  Not much else to offer cardiology wise at this point.   Kerry Hough  MD [facility]  05/19/2014, 11:53 AM

## 2014-05-19 NOTE — Progress Notes (Signed)
After second dose of narcan, pt woke, placed on 4LNC. Oriented x3. Swallowed small sips of water without difficulty. However, pt subsequently refused to take any PO medication. Explained that not taking pills, especially Plavix, could result in his death or worsening of his medical condition. Pt responded "Well I'm going to die anyway, I don't want it."

## 2014-05-19 NOTE — Progress Notes (Signed)
Md notified about pt c/o blurred vision and dizziness.  Cob = 115 Vs stable.  Will continue to monitor for now per md. Saunders Revel T

## 2014-05-19 NOTE — Progress Notes (Signed)
I have personally seen and examined this patient and agree with the assessment/plan as outlined above by Denton Brick MD (PGY2). Dialyzed with difficulty yesterday (he was tachycardic, had palpitations and a sense of impending doom). I have discussed with the family of the difficulty of his situation and will do a second dialysis treatment today (for volume removal and to also see if he even tolerates it). Palliative care team on board- overall outlook poor at this point with limited treatment options and may resort to palliative/comfort care measures only. Ellarie Picking K.,MD 05/19/2014 11:28 AM

## 2014-05-20 LAB — BASIC METABOLIC PANEL
Anion gap: 25 — ABNORMAL HIGH (ref 5–15)
BUN: 51 mg/dL — ABNORMAL HIGH (ref 6–23)
CO2: 18 mEq/L — ABNORMAL LOW (ref 19–32)
Calcium: 9.7 mg/dL (ref 8.4–10.5)
Chloride: 96 mEq/L (ref 96–112)
Creatinine, Ser: 4.59 mg/dL — ABNORMAL HIGH (ref 0.50–1.35)
GFR calc Af Amer: 12 mL/min — ABNORMAL LOW (ref >90)
GFR calc non Af Amer: 11 mL/min — ABNORMAL LOW (ref >90)
GLUCOSE: 169 mg/dL — AB (ref 70–99)
POTASSIUM: 4 meq/L (ref 3.7–5.3)
Sodium: 139 mEq/L (ref 137–147)

## 2014-05-20 LAB — GLUCOSE, CAPILLARY
GLUCOSE-CAPILLARY: 131 mg/dL — AB (ref 70–99)
Glucose-Capillary: 140 mg/dL — ABNORMAL HIGH (ref 70–99)
Glucose-Capillary: 141 mg/dL — ABNORMAL HIGH (ref 70–99)
Glucose-Capillary: 149 mg/dL — ABNORMAL HIGH (ref 70–99)
Glucose-Capillary: 163 mg/dL — ABNORMAL HIGH (ref 70–99)

## 2014-05-20 MED ORDER — FENTANYL CITRATE 0.05 MG/ML IJ SOLN: 12.5000 ug | INTRAMUSCULAR | Status: DC | PRN

## 2014-05-20 NOTE — Progress Notes (Addendum)
Pt willing to take some of his meds thus far today - states swallowing the larger pills is difficult.  Pt has no c/o pain or discomfort, states all he wants it "a sip of diet coke from a bottle with a straw".  Pt's diet currently  sips with meds - paged Wendee Beavers, MD to inquire if pt may have a few sips of diet coke - MD states ok to do so.    Pt's son bedside - has gone to the cafeteria for a bottle of diet coke.    Also received permission for diet coke from [place], MD.  Pt currently watching tv - will continue to closely monitor.

## 2014-05-20 NOTE — Progress Notes (Signed)
I have personally seen and examined this patient and agree with the assessment/plan as outlined above by Gordy Levan MD (PGY2). Status post his second hemodialysis treatment yesterday that he tolerated better than the first. Today, he appears to be more awake and alert and I discussed with both him and his wife regarding his palliative care options. The decision is to do one more dialysis treatment tomorrow to try and see if we can volume unload him  further and before he discontinues aall life-sustaining therapies and goes into hospice care (most likely admission to hospice rather than hospice at home based on the assessment by Dr. Deitra Mayo). Dialysis catheter will be discontinued before discharge Davon Folta K.,MD 05/20/2014 12:35 PM

## 2014-05-20 NOTE — Progress Notes (Signed)
Patient ID: Raymond [name] male   DOB: 1929/11/12 80 y.o.   MRN: [id number]  S: Pt on 5-6L [place] saturating 97%.  He appears to be less SOB than previously.  He reports feeling very weak.  He has received 2 dialysis treatments (last yesterday). Also received narcan x2 yesterday for lethargy.  O:  VS: BP 186/36  Pulse 50  Temp(Src) 99.2 F (37.3 C) (Oral)  Resp 19  Ht 5\' 6"  (1.676 m)  Wt 130 lb 11.7 oz (59.3 kg)  BMI 21.11 kg/m2  SpO2 98%  Intake/Output:  Intake/Output Summary (Last 24 hours) at 05/20/14 1154 Last data filed at 05/19/14 2300  Gross per 24 hour  Intake     50 ml  Output   2700 ml  Net  -2650 ml    Weight change: Filed Weights   05/18/14 1939 05/19/14 1745 05/19/14 2030  Weight: 137 lb 12.6 oz (62.5 kg) 144 lb 10 oz (65.6 kg) 130 lb 11.7 oz (59.3 kg)    Physical Exam: Constitutional: Vital signs reviewed. Elderly male appears acutely ill, pale.   HEENT: Pale conjunctivae Neck: Supple, trachea midline Cardiovascular:irregular  Pulmonary/Chest: CTAB Abdominal: Soft. Non-tender, non-distended, bowel sounds are normal  Extremities: No LE edema Neurological: Alert, moves all extremities voluntarily, asterixis improved  Skin: Warm, dry and intact. No rash.   Recent Labs Lab 05/16/14 1601 05/17/14 0110 05/18/14 0330  NA 135* 134* 138  K 4.5 4.1 4.5  CL 95* 95* 96  CO2 22 20 21   GLUCOSE 137* 115* 210*  BUN 73* 73* 81*  CREATININE 5.73* 5.73* 6.30*  ALBUMIN  --   --  2.2*  CALCIUM 9.8 9.5 9.5  AST  --   --  20  ALT  --   --  9    Liver Function Tests:  Recent Labs Lab 05/18/14 0330  AST 20  ALT 9  ALKPHOS 75  BILITOT 0.3  PROT 6.1  ALBUMIN 2.2*   CBC:  Recent Labs Lab 05/16/14 1601 05/17/14 0110 05/18/14 0330  WBC 22.0* 23.7* 19.5*  NEUTROABS 19.6*  --   --   HGB 11.2* 10.4* 9.8*  HCT 35.1* 32.4* 30.8*  MCV 96.4 93.6 94.8  PLT 288 270 281    Cardiac Enzymes:  Recent Labs Lab 05/16/14 1955 05/17/14 0110 05/17/14 0718   TROPONINI <0.30 <0.30 <0.30    CBG:  Recent Labs Lab 05/19/14 1209 05/19/14 1533 05/19/14 2013 05/19/14 2342 05/20/14 0819  GLUCAP 150* 147* 115* 130* 141*    Iron Studies:   Recent Labs  05/17/14 1545  IRON <10*  TIBC Not calculated due to Iron <10.    Studies/Results: Dg Chest Port 1 View  05/18/2014   CLINICAL DATA:   IMPRESSION: No pneumothorax following LEFT jugular line placement.  COPD changes with bibasilar atelectasis.   Electronically Signed   By: Lavonia Dana M.D.   On: 05/18/2014 14:44   . antiseptic oral rinse  15 mL Mouth Rinse BID  . antiseptic oral rinse  15 mL Mouth Rinse q12n4p  . aspirin EC  81 mg Oral Daily  . atorvastatin  80 mg Oral q1800  . ceFEPime (MAXIPIME) IV  500 mg Intravenous Q24H  . chlorhexidine  15 mL Mouth Rinse BID  . clopidogrel  75 mg Oral Q breakfast  . doxazosin  4 mg Oral Daily  . enoxaparin (LOVENOX) injection  30 mg Subcutaneous Q24H  . feeding supplement (ENSURE COMPLETE)  237 mL Oral TID BM  . insulin  aspart  0-9 Units Subcutaneous TID WC  . ipratropium-albuterol  3 mL Nebulization TID  . isosorbide mononitrate  60 mg Oral Daily  . mometasone-formoterol  2 puff Inhalation BID  . multivitamin  1 tablet Oral QHS  . pantoprazole  40 mg Oral Daily  . paricalcitol  1 mcg Oral QODAY  . sevelamer carbonate  800 mg Oral TID WC  . sodium chloride  3 mL Intravenous Q12H  . vancomycin  1,000 mg Intravenous Q48H    BMET:    Component Value Date/Time   NA 138 05/18/2014 0330   K 4.5 05/18/2014 0330   CL 96 05/18/2014 0330   CO2 21 05/18/2014 0330   GLUCOSE 210* 05/18/2014 0330   BUN 81* 05/18/2014 0330   CREATININE 6.30* 05/18/2014 0330   CALCIUM 9.5 05/18/2014 0330   GFRNONAA 7* 05/18/2014 0330   GFRAA 9* 05/18/2014 0330    CBC:    Component Value Date/Time   WBC 19.5* 05/18/2014 0330   RBC 3.25* 05/18/2014 0330   HGB 9.8* 05/18/2014 0330   HCT 30.8* 05/18/2014 0330   PLT 281 05/18/2014 0330   MCV 94.8 05/18/2014 0330   MCH  30.2 05/18/2014 0330   MCHC 31.8 05/18/2014 0330   RDW 15.8* 05/18/2014 0330   LYMPHSABS 1.2 05/16/2014 1601   MONOABS 1.1* 05/16/2014 1601   EOSABS 0.1 05/16/2014 1601   BASOSABS 0.0 05/16/2014 1601     Assessment/Plan:  CKD Stage V-  Baseline creatinine ~5's. Pt is followed by Dr. Moshe Cipro.  Cr 6.3 yesterday. HD x 2; 2.5L removed x 2.  iPTH wnl.  Palliative care involved.  Cardiology not a candidate for a permanent pacemaker for complete heart block. Prognosis poor.  Could try HD again tomorrow, pt able to tolerate only 2 hrs yesterday. Pt received duonebs today tid which also seems to have helped with SOB.   -continue sevelamer 800mg  tid with meals  -continue zemplar, renvela  ?HCAP-  On vancomycin and cefepime  -mgmt per primary   Acute on chronic diastolic CHF -05/04/9484 LVEF 46-27%, grade 1 diastolic dysfunction -mgmt per cardiology   Anemia of chronic disease- Baseline hgb 9-12. Iron <10. -consider feraheme  DM-  mgmt per primary   Complete heart block-  mgmt per cardiology, not a candidate for a permanent pacemaker    Jones Bales, MD Internal Medicine Teaching Service, PGY-2

## 2014-05-20 NOTE — Progress Notes (Signed)
ANTIBIOTIC CONSULT NOTE - FOLLOW UP  Pharmacy Consult for Vancomycin and Cefepime Indication: pneumonia  Allergies  Allergen Reactions  . Codeine Nausea And Vomiting  . Dilaudid [Hydromorphone Hcl] Nausea And Vomiting    "I get deathly sick"; diaphoresis    Patient Measurements: Height: 5\' 6"  (167.6 cm) Weight: 130 lb 11.7 oz (59.3 kg) IBW/kg (Calculated) : 63.8  Vital Signs: Temp: 99.2 F (37.3 C) (07/12 1225) Temp src: Oral (07/12 1225) BP: 161/44 mmHg (07/12 1603) Pulse Rate: 53 (07/12 1603) Intake/Output from previous day: 07/11 0701 - 07/12 0700 In: 50 [IV Piggyback:50] Out: 2700 [Urine:200]  Labs:  Recent Labs  05/18/14 0330 05/20/14 1225  WBC 19.5*  --   HGB 9.8*  --   PLT 281  --   CREATININE 6.30* 4.59*   Estimated Creatinine Clearance: 10.4 ml/min (by C-G formula based on Cr of 4.59).  Assessment:  Day # 5 Vancomycin and Cefepime for HCAP coverage.  First HD session (7/10 late pm) not tolerated well, but tolerated 2nd session (7/11 pm) better, and one more HD session planned for 7/13.    Vancomycin 1 gram IV q48hrs begun on 7/8, and 2nd dose given on 7/11 ~2am, after 1st HD session (BFR 200).  Estimated vanc level ~30 mcg/ml after that dose; estimated level is still > 15 mcg/ml today, after 2.5 hrs HD last night (BFR 250).   Goal of Therapy:  Vancomycin trough level 15-20 mcg/ml, pre-HD levels of 15-25 mcg/ml Appropriate Cefepime dose for renal function and infection  Plan:   Standing vancomycin dose cancelled.   Will check vancomycin random level in am, and re-dose after HD on 7/13 if level drops to   Continue Cefepime 500 mg IV q24hrs.  Arty Baumgartner, [place] Pager: [phone] 05/20/2014,4:10 PM

## 2014-05-20 NOTE — Progress Notes (Signed)
SLP Cancellation Note  Patient Details Name: Raymond [name] MRN: [id number] DOB: 05-07-1930   Cancelled treatment: ST to f/u for POC on 05/21/14. Sharman Crate Tennyson, [place] [facility] 05/20/2014, 3:45 PM

## 2014-05-20 NOTE — Progress Notes (Signed)
[facility] PROGRESS NOTE  Raymond [name] WUX:[id number] DOB: 10/07/1930 DOA: 05/16/2014 PCP: Milagros Evener, MD  Assessment/Plan:   Complete heart block - Pt transferred to Cardiac Stepdown unit - Cardiology and EP specialist consulted. Per EP specialist's note patient is not a good candidate for PPM placement given all his co morbidities, respiratory distress, and CKD stage V. - Palliative team consulted and [place] being determined. Currently considering residential hospice once patient is ready for discharge.    Acute on chronic diastolic CHF (congestive heart failure), NYHA class 4 - Complicated by history of chronic kidney disease stage V.  - Nephrology on board and assisting with volume removal. After two dialysis sessions patient's breathing condition improved.    CKD (chronic kidney disease) stage 5, GFR less than 15 ml/min - Nephrology managing. HD started on 05/18/14, plan is for HD 05/21/14  Diabetes mellitus - Patient currently n.p.o. but once able to eat would recommend diabetic diet. - Patient currently on sliding scale insulin - Given that patient is n.p.o. we'll adjust CBG monitoring to every 4 hours  HCAP - currently on broad spectrum antibiotics cefepime and vancomycin - My index of suspicion is that currently shortness of breath secondary to patient being fluid overloaded given his history of CHF (diastolic) in context of chronic kidney disease stage V. Patient is currently afebrile but currently I do not have any other good reasons of why his white blood cell count would be elevated above normal values. As such at this juncture favor continuing broad-spectrum antibiotics - Nurse reports suspicion for aspiration as such will place order for speech therapy evaluation. - reassess cbc next am.  Code Status: DNR Family Communication: discussed with wife at bedside  Disposition Plan: Palliative on board and plans are for residential  hospice.   Consultants:  Cardiology  EP  Nephrology  Palliative care team  Antibiotics:  Cefepime and Vancomycin  HPI/Subjective: Breathing condition improved per wife. Pt is off of bipap and on [place] and responding to questions.  Objective: Filed Vitals:   05/20/14 0820  BP: 186/36  Pulse: 50  Temp: 99.2 F (37.3 C)  Resp: 19    Intake/Output Summary (Last 24 hours) at 05/20/14 1016 Last data filed at 05/19/14 2300  Gross per 24 hour  Intake     50 ml  Output   2700 ml  Net  -2650 ml   Filed Weights   05/18/14 1939 05/19/14 1745 05/19/14 2030  Weight: 62.5 kg (137 lb 12.6 oz) 65.6 kg (144 lb 10 oz) 59.3 kg (130 lb 11.7 oz)    Exam:   General:  Alert and awake, in NAD  Cardiovascular: + S1 and S2, no rubs  Respiratory: clear to auscultation, decreased breath sounds at bases, no wheezes  Abdomen: soft, ND  Musculoskeletal: no clubbing   Data Reviewed: Basic Metabolic Panel:  Recent Labs Lab 05/16/14 1601 05/17/14 0110 05/18/14 0330  NA 135* 134* 138  K 4.5 4.1 4.5  CL 95* 95* 96  CO2 22 20 21   GLUCOSE 137* 115* 210*  BUN 73* 73* 81*  CREATININE 5.73* 5.73* 6.30*  CALCIUM 9.8 9.5 9.5  MG  --   --  2.3   Liver Function Tests:  Recent Labs Lab 05/18/14 0330  AST 20  ALT 9  ALKPHOS 75  BILITOT 0.3  PROT 6.1  ALBUMIN 2.2*   No results found for this basename: LIPASE, AMYLASE,  in the last 168 hours No results found for this basename: AMMONIA,  in  the last 168 hours CBC:  Recent Labs Lab 05/16/14 1601 05/17/14 0110 05/18/14 0330  WBC 22.0* 23.7* 19.5*  NEUTROABS 19.6*  --   --   HGB 11.2* 10.4* 9.8*  HCT 35.1* 32.4* 30.8*  MCV 96.4 93.6 94.8  PLT 288 270 281   Cardiac Enzymes:  Recent Labs Lab 05/16/14 1955 05/17/14 0110 05/17/14 0718  TROPONINI <0.30 <0.30 <0.30   BNP (last 3 results)  Recent Labs  04/21/14 0845 05/16/14 1955  PROBNP 5741.0* 21955.0*   CBG:  Recent Labs Lab 05/19/14 1209 05/19/14 1533  05/19/14 2013 05/19/14 2342 05/20/14 0819  GLUCAP 150* 147* 115* 130* 141*    Recent Results (from the past 240 hour(s))  URINE CULTURE     Status: None   Collection Time    05/16/14  5:39 PM      Result Value Ref Range Status   Specimen Description URINE, CATHETERIZED   Final   Special Requests NONE   Final   Culture  Setup Time     Final   Value: 05/16/2014 18:04     Performed at [facility]     Final   Value: >=100,000 COLONIES/ML     Performed at [facility]   Culture     Final   Value: Multiple bacterial morphotypes present, none predominant. Suggest appropriate recollection if clinically indicated.     Performed at [facility]   Report Status 05/17/2014 FINAL   Final  CULTURE, BLOOD (ROUTINE X 2)     Status: None   Collection Time    05/16/14  5:40 PM      Result Value Ref Range Status   Specimen Description BLOOD RIGHT ARM   Final   Special Requests BOTTLES DRAWN AEROBIC AND ANAEROBIC [facility]   Final   Culture  Setup Time     Final   Value: 05/16/2014 21:56     Performed at [facility]   Culture     Final   Value:        BLOOD CULTURE RECEIVED NO GROWTH TO DATE CULTURE WILL BE HELD FOR 5 DAYS BEFORE ISSUING A FINAL NEGATIVE REPORT     Performed at [facility]   Report Status PENDING   Incomplete  CULTURE, BLOOD (ROUTINE X 2)     Status: None   Collection Time    05/16/14  5:55 PM      Result Value Ref Range Status   Specimen Description BLOOD LEFT WRIST   Final   Special Requests BOTTLES DRAWN AEROBIC AND ANAEROBIC 5CC   Final   Culture  Setup Time     Final   Value: 05/16/2014 21:56     Performed at [facility]   Culture     Final   Value:        BLOOD CULTURE RECEIVED NO GROWTH TO DATE CULTURE WILL BE HELD FOR 5 DAYS BEFORE ISSUING A FINAL NEGATIVE REPORT     Performed at [facility]   Report Status PENDING   Incomplete  MRSA PCR SCREENING     Status: None   Collection Time     05/17/14 10:48 AM      Result Value Ref Range Status   MRSA by PCR NEGATIVE  NEGATIVE Final   Comment:            The GeneXpert MRSA Assay (FDA     approved for NASAL specimens  only), is one component of a     comprehensive MRSA colonization     surveillance program. It is not     intended to diagnose MRSA     infection nor to guide or     monitor treatment for     MRSA infections.     Studies: Dg Chest Port 1 View  05/18/2014   CLINICAL DATA:  LEFT jugular line placement, history COPD, hypertension, coronary artery disease, diabetes, colon cancer  EXAM: PORTABLE CHEST - 1 VIEW  COMPARISON:  Portable exam 1427 hr compared to 05/16/2014  FINDINGS: New LEFT jugular central venous catheter with tip projecting over confluence of LEFT brachiocephalic vein and SVC.  Upper normal heart size post CABG.  Mediastinal contours and pulmonary vascularity normal.  Atherosclerotic calcification aorta.  COPD changes with bibasilar atelectasis versus infiltrate little changed.  Minimal RIGHT apical scarring.  No pleural effusion or pneumothorax.  Bones demineralized.  IMPRESSION: No pneumothorax following LEFT jugular line placement.  COPD changes with bibasilar atelectasis.   Electronically Signed   By: Lavonia Dana M.D.   On: 05/18/2014 14:44    Scheduled Meds: . antiseptic oral rinse  15 mL Mouth Rinse BID  . antiseptic oral rinse  15 mL Mouth Rinse q12n4p  . aspirin EC  81 mg Oral Daily  . atorvastatin  80 mg Oral q1800  . ceFEPime (MAXIPIME) IV  500 mg Intravenous Q24H  . chlorhexidine  15 mL Mouth Rinse BID  . clopidogrel  75 mg Oral Q breakfast  . doxazosin  4 mg Oral Daily  . enoxaparin (LOVENOX) injection  30 mg Subcutaneous Q24H  . feeding supplement (ENSURE COMPLETE)  237 mL Oral TID BM  . insulin aspart  0-9 Units Subcutaneous TID WC  . ipratropium-albuterol  3 mL Nebulization TID  . isosorbide mononitrate  60 mg Oral Daily  . mometasone-formoterol  2 puff Inhalation BID  .  multivitamin  1 tablet Oral QHS  . pantoprazole  40 mg Oral Daily  . paricalcitol  1 mcg Oral QODAY  . sevelamer carbonate  800 mg Oral TID WC  . sodium chloride  3 mL Intravenous Q12H  . vancomycin  1,000 mg Intravenous Q48H   Continuous Infusions:    Time spent: > 35 minutes    Velvet Bathe  [facility] Pager [phone]. If 7PM-7AM, please contact night-coverage at [email or web address], password [facility] 05/20/2014, 10:16 AM  LOS: 4 days

## 2014-05-20 NOTE — Progress Notes (Signed)
Progress Note from the Palliative Medicine Team at [facility]  Subjective: Weaned to nasal cannula post dialysis.  Does not talk much, but tells me his breathing is okay. Denies pain or nausea. No other complaints he has.   Spoke with wife this afternoon about goals as noted below.       Objective: Allergies  Allergen Reactions  . Codeine Nausea And Vomiting  . Dilaudid [Hydromorphone Hcl] Nausea And Vomiting    "I get deathly sick"; diaphoresis   Scheduled Meds: . antiseptic oral rinse  15 mL Mouth Rinse BID  . antiseptic oral rinse  15 mL Mouth Rinse q12n4p  . aspirin EC  81 mg Oral Daily  . atorvastatin  80 mg Oral q1800  . ceFEPime (MAXIPIME) IV  500 mg Intravenous Q24H  . chlorhexidine  15 mL Mouth Rinse BID  . clopidogrel  75 mg Oral Q breakfast  . doxazosin  4 mg Oral Daily  . enoxaparin (LOVENOX) injection  30 mg Subcutaneous Q24H  . feeding supplement (ENSURE COMPLETE)  237 mL Oral TID BM  . insulin aspart  0-9 Units Subcutaneous TID WC  . ipratropium-albuterol  3 mL Nebulization TID  . isosorbide mononitrate  60 mg Oral Daily  . mometasone-formoterol  2 puff Inhalation BID  . multivitamin  1 tablet Oral QHS  . pantoprazole  40 mg Oral Daily  . paricalcitol  1 mcg Oral QODAY  . sevelamer carbonate  800 mg Oral TID WC  . sodium chloride  3 mL Intravenous Q12H  . vancomycin  1,000 mg Intravenous Q48H   Continuous Infusions:  PRN Meds:.acetaminophen, acetaminophen, albuterol, fentaNYL, naLOXone (NARCAN)  injection, ondansetron (ZOFRAN) IV, ondansetron, traMADol  BP 186/36  Pulse 50  Temp(Src) 99.2 F (37.3 C) (Oral)  Resp 19  Ht 5\' 6"  (1.676 m)  Wt 59.3 kg (130 lb 11.7 oz)  BMI 21.11 kg/m2  SpO2 98%   PPS: 20   Intake/Output Summary (Last 24 hours) at 05/20/14 1146 Last data filed at 05/19/14 2300  Gross per 24 hour  Intake     50 ml  Output   2700 ml  Net  -2650 ml      Physical Exam:  General: Alert, NAD, breathing comfortably on [place] HEENT:  [place],  dry mm Chest:   Clearer today, symm expansion  CVS: Mild bradycardia Abdomen: soft, NT Ext: warm/dry  Labs: CBC    Component Value Date/Time   WBC 19.5* 05/18/2014 0330   RBC 3.25* 05/18/2014 0330   HGB 9.8* 05/18/2014 0330   HCT 30.8* 05/18/2014 0330   PLT 281 05/18/2014 0330   MCV 94.8 05/18/2014 0330   MCH 30.2 05/18/2014 0330   MCHC 31.8 05/18/2014 0330   RDW 15.8* 05/18/2014 0330   LYMPHSABS 1.2 05/16/2014 1601   MONOABS 1.1* 05/16/2014 1601   EOSABS 0.1 05/16/2014 1601   BASOSABS 0.0 05/16/2014 1601    BMET    Component Value Date/Time   NA 138 05/18/2014 0330   K 4.5 05/18/2014 0330   CL 96 05/18/2014 0330   CO2 21 05/18/2014 0330   GLUCOSE 210* 05/18/2014 0330   BUN 81* 05/18/2014 0330   CREATININE 6.30* 05/18/2014 0330   CALCIUM 9.5 05/18/2014 0330   GFRNONAA 7* 05/18/2014 0330   GFRAA 9* 05/18/2014 0330    CMP     Component Value Date/Time   NA 138 05/18/2014 0330   K 4.5 05/18/2014 0330   CL 96 05/18/2014 0330   CO2 21 05/18/2014 0330  GLUCOSE 210* 05/18/2014 0330   BUN 81* 05/18/2014 0330   CREATININE 6.30* 05/18/2014 0330   CALCIUM 9.5 05/18/2014 0330   PROT 6.1 05/18/2014 0330   ALBUMIN 2.2* 05/18/2014 0330   AST 20 05/18/2014 0330   ALT 9 05/18/2014 0330   ALKPHOS 75 05/18/2014 0330   BILITOT 0.3 05/18/2014 0330   GFRNONAA 7* 05/18/2014 0330   GFRAA 9* 05/18/2014 0330    Assessment/Recommendations:  80 yo male with multiple medical problems including CKD V with AKI, Severe AS, Diastolic Dysfx, HTN, DM who presented on 05/16/14 with SOB. Found to have suspected HCAP with worsening renal function necessitating dialysis, complete heart block, and acute respiratory failure.   1. Code Status: DNR   2. Goals of Care-  -Tolerated dialysis well yesterday.  Breathing is better.  Spoke with wife at bedside as well as Dr Posey Pronto. She plans on one more dialysis session and then pursuing hospice/comfort care. She discussed with Dr Posey Pronto that long-term dialysis would not be planned and next  session would be his final run.  Wife discussed possibly taking home with hospice, but this would be difficult for her as she has hearing impairment and would not be able to hear him in the middle of the night.  She agreed that inpatient hospice facility would be best plan of care.  My belief is that he would meet inpatient hospice criteria with expected rapid and potentially symptomatic decline after withdrawal of dialysis and risk for further arrythmia with 3rd degree heart block. Inpatient vs residential hospice care would be at discretion of hospice facility. I have spoke with weekend social worker to relay plan and start hospice referral process tomorrow. Discussed prognosis with wife and family yesterday which I would suspect to be most likely in days to weeks range after dialysis discontinued.    3. Symptom Management:                           1. Dyspnea- Improved with volume removal.  Would avoid morphine with renal failure. PRN fentanyl much safer alternative.         2. Pill Burden- refusing some meds. Would consider d/c'ing meds that would likely only have long-term benefits such as ASA/Plavix, statin, etc  4. Psychosocial: Lives at home with his wife whom is hard of hearing but able to read lips well. They also receive help from family friend Alberteen Sam whom is a PA. Family coming in from out of town to see Evren.    Time In  Time Out  Total Time Spent with Patient  Total Overall Time   [phone] 15 min  25 min    Greater than 50% of this time was spent counseling and coordinating care related to the above assessment and plan.   Doran Clay D.O.  Palliative Medicine Team at [facility]  Team Phone: [phone]

## 2014-05-21 ENCOUNTER — Inpatient Hospital Stay (HOSPITAL_COMMUNITY): Payer: Managed Care, Other (non HMO)

## 2014-05-21 LAB — BASIC METABOLIC PANEL
Anion gap: 24 — ABNORMAL HIGH (ref 5–15)
BUN: 64 mg/dL — ABNORMAL HIGH (ref 6–23)
CHLORIDE: 95 meq/L — AB (ref 96–112)
CO2: 19 mEq/L (ref 19–32)
Calcium: 9.7 mg/dL (ref 8.4–10.5)
Creatinine, Ser: 5.5 mg/dL — ABNORMAL HIGH (ref 0.50–1.35)
GFR calc non Af Amer: 9 mL/min — ABNORMAL LOW (ref >90)
GFR, EST AFRICAN AMERICAN: 10 mL/min — AB (ref >90)
GLUCOSE: 170 mg/dL — AB (ref 70–99)
Potassium: 3.7 mEq/L (ref 3.7–5.3)
Sodium: 138 mEq/L (ref 137–147)

## 2014-05-21 LAB — CBC
HEMATOCRIT: 33.3 % — AB (ref 39.0–52.0)
Hemoglobin: 10.7 g/dL — ABNORMAL LOW (ref 13.0–17.0)
MCH: 30.1 pg (ref 26.0–34.0)
MCHC: 32.1 g/dL (ref 30.0–36.0)
MCV: 93.8 fL (ref 78.0–100.0)
PLATELETS: 282 10*3/uL (ref 150–400)
RBC: 3.55 MIL/uL — AB (ref 4.22–5.81)
RDW: 15.6 % — ABNORMAL HIGH (ref 11.5–15.5)
WBC: 12.8 10*3/uL — AB (ref 4.0–10.5)

## 2014-05-21 LAB — GLUCOSE, CAPILLARY
GLUCOSE-CAPILLARY: 212 mg/dL — AB (ref 70–99)
GLUCOSE-CAPILLARY: 159 mg/dL — AB (ref 70–99)
GLUCOSE-CAPILLARY: 205 mg/dL — AB (ref 70–99)
Glucose-Capillary: 180 mg/dL — ABNORMAL HIGH (ref 70–99)
Glucose-Capillary: 341 mg/dL — ABNORMAL HIGH (ref 70–99)
Glucose-Capillary: 343 mg/dL — ABNORMAL HIGH (ref 70–99)

## 2014-05-21 LAB — VANCOMYCIN, RANDOM: Vancomycin Rm: 17.9 ug/mL

## 2014-05-21 MED ORDER — ENSURE COMPLETE PO LIQD: 237.0000 mL | Freq: Three times a day (TID) | ORAL | Status: DC | PRN

## 2014-05-21 MED ORDER — INSULIN ASPART 100 UNIT/ML ~~LOC~~ SOLN
4.0000 [IU] | Freq: Once | SUBCUTANEOUS | Status: AC
  Administered 2014-05-21: 4 [IU] via SUBCUTANEOUS

## 2014-05-21 MED ORDER — VANCOMYCIN HCL 500 MG IV SOLR
500.0000 mg | Freq: Once | INTRAVENOUS | Status: DC
  Filled 2014-05-21: qty 500

## 2014-05-21 NOTE — Progress Notes (Signed)
ANTIBIOTIC CONSULT NOTE - FOLLOW UP  Pharmacy Consult for Vancomycin and Cefepime Indication: pneumonia  Allergies  Allergen Reactions  . Codeine Nausea And Vomiting  . Dilaudid [Hydromorphone Hcl] Nausea And Vomiting    "I get deathly sick"; diaphoresis    Patient Measurements: Height: 5\' 6"  (167.6 cm) Weight: 130 lb 11.7 oz (59.3 kg) IBW/kg (Calculated) : 63.8  Vital Signs: BP: 166/44 mmHg (07/13 0330) Pulse Rate: 49 (07/13 0330) Intake/Output from previous day: 07/12 0701 - 07/13 0700 In: 150 [P.O.:100; IV Piggyback:50] Out: -   Labs:  Recent Labs  05/20/14 1225 05/21/14 0400  WBC  --  12.8*  HGB  --  10.7*  PLT  --  282  CREATININE 4.59* 5.50*   Estimated Creatinine Clearance: 8.7 ml/min (by C-G formula based on Cr of 5.5).  Assessment: 80 yo male on Vancomycin and Cefepime for HCAP coverage.  Patient also noted with CKD stage V on HD. First HD session (7/10 late pm) not tolerated well, but tolerated 2nd session (7/11 pm) better, and one more HD session planned for 7/13. A random vancomycin level was 17.9 today and at goal. Noted today is last HD day and to begin hospice referral process.      Goal of Therapy:  Vancomycin trough level 15-20 mcg/ml, pre-HD levels of 15-25 mcg/ml Appropriate Cefepime dose for renal function and infection  Plan:  -Continue cefepime -Vancomycin 500mg  IV today after HD -Will follow further patient plans  Hildred Laser, Pharm D 05/21/2014 7:36 AM

## 2014-05-21 NOTE — Evaluation (Signed)
Clinical/Bedside Swallow Evaluation Patient Details  Name: Raymond [name] MRN: [id number] Date of Birth: 03/12/1930  Today's Date: 05/21/2014 Time: 1139-1205 SLP Time Calculation (min): 26 min  Past Medical History:  Past Medical History  Diagnosis Date  . COPD (chronic obstructive pulmonary disease)   . Hypertension   . CAD (coronary artery disease), autologous vein bypass graft   . High cholesterol   . Acute chest pain 08/18/2012    "cardiologist said it was not my heart" (08/18/2012)  . Shortness of breath     "related to the COPD" (08/18/2012)  . Type II diabetes mellitus   . History of blood transfusion 1938  . Colon cancer   . Chronic kidney disease, stage 4 (severe)   . Arthritis     "back, hands, neck" (08/18/2012)  . History of pancreatitis ~ 1985  . Anginal pain   . Impotence of organic origin   . Type I (juvenile type) diabetes mellitus without mention of complication, not stated as uncontrolled   . On home oxygen therapy     "2L at night & prn" (05/16/2014)  . Pneumonia 1938  . HCAP (healthcare-associated pneumonia) 05/16/2014   Past Surgical History:  Past Surgical History  Procedure Laterality Date  . Appendectomy  1938  . Inguinal hernia repair  2000's    bilaterally  . Cataract extraction w/ intraocular lens  implant, bilateral  2000's  . Coronary artery bypass graft  2000's    CABG X4  . Colectomy  2000's    "cancer; prior to having double hernia operation" (08/18/2012)   HPI:  80 year old man with a history of CAD, CKD stage V, aortic stenosis, COPD, hypertension, diabetes mellitus NSTEMI 05/03/14 presents with one-week history of worsening shortness of breath, cough and unresponsive by his home health care and chest discomfort. Initial CXR 05/16/14 Increasing interstitial prominence and bibasilar opacities. Small bilateral effusions. Findings could represent edema or infection; repeat CXR 05/21/14 marked improvement in pulmonary edema with minimal  interstitial edema remaining at the bases. Palliative care following; not candidate for long term HD and plans for hospice home at [facility].   Assessment / Plan / Recommendation Clinical Impression  Pt. alert without increased work of breathing at rest.  No overt inidications of aspiration.  His COPD and decreased endurance increase risk of pharyngeal dysphagia and educated pt./son with clinical reasoning of relationship between respiration and swallowing.  Recommend regular texture diet and thin liquids, small sips if using straws, sit upright and remain up minumum 30 min after meals.  No follow up needed.    Aspiration Risk  Moderate    Diet Recommendation Regular;Thin liquid   Liquid Administration via: Cup;Straw Medication Administration: Whole meds with puree Supervision: Patient able to self feed;Intermittent supervision to cue for compensatory strategies Compensations: Slow rate;Small sips/bites Postural Changes and/or Swallow Maneuvers: Seated upright 90 degrees;Upright 30-60 min after meal    Other  Recommendations Oral Care Recommendations: Oral care BID   Follow Up Recommendations  None    Frequency and Duration        Pertinent Vitals/Pain WDL         Swallow Study         Oral/Motor/Sensory Function Overall Oral Motor/Sensory Function: Appears within functional limits for tasks assessed   Ice Chips Ice chips: Within functional limits   Thin Liquid Thin Liquid: Within functional limits Presentation: Cup;Straw    Nectar Thick Nectar Thick Liquid: Not tested   Honey Thick Honey Thick Liquid: Not tested  Puree Puree: Within functional limits   Solid   GO    Solid: Within functional limits       Houston Siren M.Ed [employer] [phone]  05/21/2014

## 2014-05-21 NOTE — Progress Notes (Signed)
[facility] PROGRESS NOTE  Raymond [name] OJJ:[id number] DOB: 02-06-1930 DOA: 05/16/2014 PCP: Milagros Evener, MD  Assessment/Plan:   Complete heart block - Pt transferred to Cardiac Stepdown unit - Cardiology and EP specialist consulted. Per EP specialist's note patient is not a good candidate for PPM placement given all his co morbidities, respiratory distress, and CKD stage V. - Palliative team consulted and [place] being determined. Currently considering residential hospice once patient is ready for discharge.    Acute on chronic diastolic CHF (congestive heart failure), NYHA class 4 - Complicated by history of chronic kidney disease stage V.  - Nephrology on board and assisting with volume removal. After two dialysis sessions patient's breathing condition improved. -  Portable Chest x ray reporting Marked improvement in pulmonary edema    CKD (chronic kidney disease) stage 5, GFR less than 15 ml/min - Nephrology managing. HD started on 05/18/14, plan is for HD 05/21/14  Diabetes mellitus - Patient currently n.p.o. but once able to eat would recommend diabetic diet. - Patient currently on sliding scale insulin - Given that patient is n.p.o. we'll adjust CBG monitoring to every 4 hours  HCAP - currently on broad spectrum antibiotics cefepime and vancomycin - WBC levels trending down.  I don't have sputum cultures to help assist with narrowing antibiotic spectrum and I would like to have speech therapy evaluation prior to changing to oral antibiotic regimen.  Discontinue Vancomycin at this juncture and continue cefepime. - Nurse reports suspicion for aspiration as such will place order for speech therapy evaluation. - reassess cbc next am.  Code Status: DNR Family Communication: discussed with wife at bedside  Disposition Plan: Palliative on board and plans are for residential hospice.   Consultants:  Cardiology  EP  Nephrology  Palliative care  team  Antibiotics:  Cefepime and   Vancomycin>>>7/13  HPI/Subjective: Breathing condition much improved per wife.   Objective: Filed Vitals:   05/21/14 0816  BP: 195/51  Pulse: 52  Temp: 98.5 F (36.9 C)  Resp: 22    Intake/Output Summary (Last 24 hours) at 05/21/14 1140 Last data filed at 05/20/14 2000  Gross per 24 hour  Intake    150 ml  Output      0 ml  Net    150 ml   Filed Weights   05/18/14 1939 05/19/14 1745 05/19/14 2030  Weight: 62.5 kg (137 lb 12.6 oz) 65.6 kg (144 lb 10 oz) 59.3 kg (130 lb 11.7 oz)    Exam:   General:  Alert and awake, in NAD  Cardiovascular: + S1 and S2, no rubs  Respiratory: clear to auscultation, decreased breath sounds at bases, no wheezes  Abdomen: soft, ND  Musculoskeletal: no clubbing   Data Reviewed: Basic Metabolic Panel:  Recent Labs Lab 05/16/14 1601 05/17/14 0110 05/18/14 0330 05/20/14 1225 05/21/14 0400  NA 135* 134* 138 139 138  K 4.5 4.1 4.5 4.0 3.7  CL 95* 95* 96 96 95*  CO2 22 20 21  18* 19  GLUCOSE 137* 115* 210* 169* 170*  BUN 73* 73* 81* 51* 64*  CREATININE 5.73* 5.73* 6.30* 4.59* 5.50*  CALCIUM 9.8 9.5 9.5 9.7 9.7  MG  --   --  2.3  --   --    Liver Function Tests:  Recent Labs Lab 05/18/14 0330  AST 20  ALT 9  ALKPHOS 75  BILITOT 0.3  PROT 6.1  ALBUMIN 2.2*   No results found for this basename: LIPASE, AMYLASE,  in the last 168  hours No results found for this basename: AMMONIA,  in the last 168 hours CBC:  Recent Labs Lab 05/16/14 1601 05/17/14 0110 05/18/14 0330 05/21/14 0400  WBC 22.0* 23.7* 19.5* 12.8*  NEUTROABS 19.6*  --   --   --   HGB 11.2* 10.4* 9.8* 10.7*  HCT 35.1* 32.4* 30.8* 33.3*  MCV 96.4 93.6 94.8 93.8  PLT 288 270 281 282   Cardiac Enzymes:  Recent Labs Lab 05/16/14 1955 05/17/14 0110 05/17/14 0718  TROPONINI <0.30 <0.30 <0.30   BNP (last 3 results)  Recent Labs  04/21/14 0845 05/16/14 1955  PROBNP 5741.0* 21955.0*   CBG:  Recent  Labs Lab 05/20/14 1649 05/20/14 2025 05/20/14 2343 05/21/14 0327 05/21/14 0819  GLUCAP 131* 140* 149* 159* 180*    Recent Results (from the past 240 hour(s))  URINE CULTURE     Status: None   Collection Time    05/16/14  5:39 PM      Result Value Ref Range Status   Specimen Description URINE, CATHETERIZED   Final   Special Requests NONE   Final   Culture  Setup Time     Final   Value: 05/16/2014 18:04     Performed at [facility]     Final   Value: >=100,000 COLONIES/ML     Performed at [facility]   Culture     Final   Value: Multiple bacterial morphotypes present, none predominant. Suggest appropriate recollection if clinically indicated.     Performed at [facility]   Report Status 05/17/2014 FINAL   Final  CULTURE, BLOOD (ROUTINE X 2)     Status: None   Collection Time    05/16/14  5:40 PM      Result Value Ref Range Status   Specimen Description BLOOD RIGHT ARM   Final   Special Requests BOTTLES DRAWN AEROBIC AND ANAEROBIC [facility]   Final   Culture  Setup Time     Final   Value: 05/16/2014 21:56     Performed at [facility]   Culture     Final   Value:        BLOOD CULTURE RECEIVED NO GROWTH TO DATE CULTURE WILL BE HELD FOR 5 DAYS BEFORE ISSUING A FINAL NEGATIVE REPORT     Performed at [facility]   Report Status PENDING   Incomplete  CULTURE, BLOOD (ROUTINE X 2)     Status: None   Collection Time    05/16/14  5:55 PM      Result Value Ref Range Status   Specimen Description BLOOD LEFT WRIST   Final   Special Requests BOTTLES DRAWN AEROBIC AND ANAEROBIC 5CC   Final   Culture  Setup Time     Final   Value: 05/16/2014 21:56     Performed at [facility]   Culture     Final   Value:        BLOOD CULTURE RECEIVED NO GROWTH TO DATE CULTURE WILL BE HELD FOR 5 DAYS BEFORE ISSUING A FINAL NEGATIVE REPORT     Performed at [facility]   Report Status PENDING   Incomplete  MRSA PCR SCREENING      Status: None   Collection Time    05/17/14 10:48 AM      Result Value Ref Range Status   MRSA by PCR NEGATIVE  NEGATIVE Final   Comment:  The GeneXpert MRSA Assay (FDA     approved for NASAL specimens     only), is one component of a     comprehensive MRSA colonization     surveillance program. It is not     intended to diagnose MRSA     infection nor to guide or     monitor treatment for     MRSA infections.     Studies: Dg Chest Port 1 View  05/21/2014   CLINICAL DATA:  Congestive heart failure. The patient has now undergone dialysis for volume removal.  EXAM: PORTABLE CHEST - 1 VIEW  COMPARISON:  05/18/2013  FINDINGS: Double lumen catheter tip is at the superior vena cava at the level of the azygos vein. Interstitial edema has markedly improved. There still some Kerley B-lines at both lung bases. COPD. Heart size and vascularity are normal.  IMPRESSION: Marked improvement in pulmonary edema with minimal interstitial edema remaining at the bases.   Electronically Signed   By: Rozetta Nunnery M.D.   On: 05/21/2014 07:17    Scheduled Meds: . antiseptic oral rinse  15 mL Mouth Rinse BID  . antiseptic oral rinse  15 mL Mouth Rinse q12n4p  . aspirin EC  81 mg Oral Daily  . atorvastatin  80 mg Oral q1800  . ceFEPime (MAXIPIME) IV  500 mg Intravenous Q24H  . chlorhexidine  15 mL Mouth Rinse BID  . clopidogrel  75 mg Oral Q breakfast  . doxazosin  4 mg Oral Daily  . enoxaparin (LOVENOX) injection  30 mg Subcutaneous Q24H  . insulin aspart  0-9 Units Subcutaneous TID WC  . ipratropium-albuterol  3 mL Nebulization TID  . isosorbide mononitrate  60 mg Oral Daily  . mometasone-formoterol  2 puff Inhalation BID  . multivitamin  1 tablet Oral QHS  . pantoprazole  40 mg Oral Daily  . paricalcitol  1 mcg Oral QODAY  . sevelamer carbonate  800 mg Oral TID WC  . sodium chloride  3 mL Intravenous Q12H  . vancomycin  500 mg Intravenous Once   Continuous Infusions:    Time spent: >  35 minutes    Velvet Bathe  [facility] Pager [phone]. If 7PM-7AM, please contact night-coverage at [email or web address], password [facility] 05/21/2014, 11:40 AM  LOS: 5 days

## 2014-05-21 NOTE — Progress Notes (Signed)
Patient Raymond [name]      DOB: 1930-06-26      SNK:[id number]   Palliative Medicine Team at [facility] Progress Note    Subjective: Alert, breathing comfortably prior to dialysis today.  Denies any pain, dyspnea, N/V. In better spirits this afternoon    Filed Vitals:   05/21/14 2015  BP: 143/44  Pulse: 55  Temp:   Resp: 19   Physical exam: GEN: alert, NAD, on [place] HEENT: [place], sclera anicteric CV: RRR LUNGS: CTAB anteriorly, symm expansion Abd; Soft, NT EXT: warm/dry     Assessment/Recommendations:  80 yo male with multiple medical problems including CKD V with AKI, Severe AS, Diastolic Dysfx, HTN, DM who presented on 05/16/14 with SOB. Found to have suspected HCAP with worsening renal function necessitating dialysis, complete heart block, and acute respiratory failure.   1. Code Status: DNR   2. Goals of Care-  -See previous notes.  Last HD planned today. Spoke with case manager who put call out to SW for residential (inpatient) hospice options.  Appreciate assistance. Spoke with wife and son who are aware of plan and would like to hospice agencies and are in agreement with plan for this being last HD session.  3. Symptom Management:  A. Dyspnea- Improved with volume removal. Would avoid morphine with renal failure. PRN fentanyl much safer alternative.  B. Pill Burden- refusing some meds. Would consider d/c'ing meds that would likely only have long-term benefits such as ASA/Plavix, statin, etc   4. Psychosocial: Lives at home with his wife whom is hard of hearing but able to read lips well. They also receive help from family friend Alberteen Sam whom is a PA. Family coming in from out of town to see Izaiah.   Time In  Time Out  Total Time Spent with Patient  Total Overall Time   915  940  15 min  25 min    Greater than 50% of this time was spent counseling and coordinating care related to the above assessment and plan.   Doran Clay D.O.  Palliative Medicine  Team at [facility]  Team Phone: [phone]

## 2014-05-21 NOTE — Progress Notes (Signed)
HD called to see if Pt could travel to HD vs HD in room.   Pt vitals stable and  mental status is also clear. Pt is pain free and has been here for more than 24 hours. Pt alternates between afib, aflutter, and paced 3rd degree HB but has pacer/ICD   Pending MD approval Pt meets criteria to travel to HD

## 2014-05-21 NOTE — Progress Notes (Signed)
Notified by HD RNs that pt developed worsened resp distress, hypoxia, and tachypnea during HD treatment, which was req early termination of HD.   I have met with pt, wife, and son afterwards.  We have agreed to no further attempts at HD and transition to palliative measures as was planned today after HD.   Pearson Grippe, MD

## 2014-05-21 NOTE — Clinical Documentation Improvement (Signed)
  PLEASE NOTE IF PRESENT ON ADMISSION  Possible Clinical Conditions?   Stage  I  Pressure Ulcer   (reddening of the skin) Stage  II Pressure Ulcer  (blister open or unopened) Stage  III Pressure Ulcer (through all layers skin) Stage IV Pressure Ulcer   (through skin & underlying  muscle, tendons, and bones) Other Condition _________________  Supporting Information:  Per the Registered Dietitian note, "Skin: stage 1 pressure ulcer to sacrum."      Skin Assessment:  Per RN assessment on 05/16/14, Stage I - Intact skin with non-blanchable redness of localized area usually over bony prominence.  Location: Sacrum.  Present on Admission: Yes.    Do you agree with this assessment by the RN?        Treatment:  Foam dressing applied.    Thank You,  Posey Pronto, RN, BSN, [place] Documentation Improvement Specialist HIM department--[place] Office [phone]

## 2014-05-21 NOTE — Progress Notes (Signed)
NUTRITION FOLLOW-UP/CONSULT  DOCUMENTATION CODES Per approved criteria  -Severe malnutrition in the context of chronic illness   Pt meets criteria for severe MALNUTRITION in the context of chronic illness as evidenced by severe depletion of muscle mass with intake </= 75% of estimated energy requirement for >/= 1 month.  INTERVENTION: Continue Ensure Complete PO TID, each supplement provides 350 kcal and 13 grams of protein, will make prn Diet texture per SLP If aggressive nutrition measures desired (i.e. Enteral feedings), please consult RD RD to continue to follow nutrition care plan.  NUTRITION DIAGNOSIS: Inadequate oral intake related to poor appetite as evidenced by 25 lb weight loss over the past year. Ongoing.  Goal: Intake to meet >90% of estimated nutrition needs.  Monitor:  PO intake, labs, weight trend, ability to take PO's  ASSESSMENT: 80 year old man with a history of CAD, CKD stage V, aortic stenosis, COPD, hypertension, diabetes mellitus presents with one-week history of worsening shortness of breath and cough. The patient was discharged from the hospital on 05/03/2014 after treatment for NSTEMI.  Renal following pt 2/2 CKD stage V. Pt is s/p 2 HD treatments. Plan is for 1 more HD session today. Pt is not a candidate for long-term HD treatment. Remains NPO. Has not been able to be seen by SLP for treatment 2/2 respiratory issues.  Palliative care team following. Per chart, pt's best plan of care would be inpatient hospice facility.  Confirmed with RN that pt remains NPO 2/2 respiratory issues.  Sodium WNL Potassium WNL  Height: Ht Readings from Last 1 Encounters:  05/16/14 5\' 6"  (1.676 m)    Weight: Wt Readings from Last 1 Encounters:  05/19/14 130 lb 11.7 oz (59.3 kg)    Ideal Body Weight: 64.5 kg  % Ideal Body Weight: 102%  Wt Readings from Last 10 Encounters:  05/19/14 130 lb 11.7 oz (59.3 kg)  05/19/14 130 lb 11.7 oz (59.3 kg)  04/30/14 149 lb  11.1 oz (67.9 kg)  04/30/14 149 lb 11.1 oz (67.9 kg)  04/30/14 149 lb 11.1 oz (67.9 kg)  04/20/14 150 lb (68.04 kg)  03/27/14 151 lb (68.493 kg)  03/14/14 151 lb (68.493 kg)  02/02/14 154 lb (69.854 kg)  08/01/13 166 lb 12.8 oz (75.66 kg)    Usual Body Weight: 166 lb 10 months ago  % Usual Body Weight: 87%  BMI:  Body mass index is 21.11 kg/(m^2). WNL  Estimated Nutritional Needs: Kcal: 1700-1900 Protein: 60-70 gm Fluid: 1.7-1.9 L  Skin: stage 1 pressure ulcer to sacrum  Diet Order: NPO   Intake/Output Summary (Last 24 hours) at 05/21/14 0904 Last data filed at 05/20/14 2000  Gross per 24 hour  Intake    150 ml  Output      0 ml  Net    150 ml    Last BM: None documented since admission   Labs:   Recent Labs Lab 05/18/14 0330 05/20/14 1225 05/21/14 0400  NA 138 139 138  K 4.5 4.0 3.7  CL 96 96 95*  CO2 21 18* 19  BUN 81* 51* 64*  CREATININE 6.30* 4.59* 5.50*  CALCIUM 9.5 9.7 9.7  MG 2.3  --   --   GLUCOSE 210* 169* 170*    CBG (last 3)   Recent Labs  05/20/14 2343 05/21/14 0327 05/21/14 0819  GLUCAP 149* 159* 180*    Scheduled Meds: . antiseptic oral rinse  15 mL Mouth Rinse BID  . antiseptic oral rinse  15 mL Mouth Rinse  q12n4p  . aspirin EC  81 mg Oral Daily  . atorvastatin  80 mg Oral q1800  . ceFEPime (MAXIPIME) IV  500 mg Intravenous Q24H  . chlorhexidine  15 mL Mouth Rinse BID  . clopidogrel  75 mg Oral Q breakfast  . doxazosin  4 mg Oral Daily  . enoxaparin (LOVENOX) injection  30 mg Subcutaneous Q24H  . feeding supplement (ENSURE COMPLETE)  237 mL Oral TID BM  . insulin aspart  0-9 Units Subcutaneous TID WC  . ipratropium-albuterol  3 mL Nebulization TID  . isosorbide mononitrate  60 mg Oral Daily  . mometasone-formoterol  2 puff Inhalation BID  . multivitamin  1 tablet Oral QHS  . pantoprazole  40 mg Oral Daily  . paricalcitol  1 mcg Oral QODAY  . sevelamer carbonate  800 mg Oral TID WC  . sodium chloride  3 mL Intravenous  Q12H  . vancomycin  500 mg Intravenous Once    Continuous Infusions:   Inda Coke MS, RD, LDN Inpatient Registered Dietitian Pager: [phone] After-hours pager: [phone]

## 2014-05-21 NOTE — Progress Notes (Signed)
I saw the patient and agree with the above assessment and plan.     Plan for HD today and then transition to palliative measures.  Wife updated.

## 2014-05-21 NOTE — Progress Notes (Signed)
Patient ID: Kingslee Mairena Cantin male   DOB: 1929/12/30 80 y.o.   MRN: [id number]  S: Pt appears more SOB and lethargic since I saw yesterday.  He is scheduled to have 1 more HD session today.  Has received 2 dialysis treatments previously (last session Sat).  UOP not documented for the past 24 h. Cr 5.50 today.   O:  VS: BP 195/51  Pulse 52  Temp(Src) 98.5 F (36.9 C) (Oral)  Resp 22  Ht 5\' 6"  (1.676 m)  Wt 130 lb 11.7 oz (59.3 kg)  BMI 21.11 kg/m2  SpO2 99% on 5L [place]  Intake/Output:  Intake/Output Summary (Last 24 hours) at 05/21/14 0921 Last data filed at 05/20/14 2000  Gross per 24 hour  Intake    150 ml  Output      0 ml  Net    150 ml    Weight change: Filed Weights   05/18/14 1939 05/19/14 1745 05/19/14 2030  Weight: 137 lb 12.6 oz (62.5 kg) 144 lb 10 oz (65.6 kg) 130 lb 11.7 oz (59.3 kg)    Physical Exam: Constitutional: Vital signs reviewed. Elderly male appears acutely ill, pale.   HEENT: Pale conjunctivae Neck: Supple, trachea midline Cardiovascular:irregular  Pulmonary/Chest: CTAB Abdominal: Soft. Non-tender, non-distended, bowel sounds are normal  Extremities: No LE edema Neurological: Alert, moves all extremities voluntarily, asterixis improved  Skin: Warm, dry and intact. No rash.   Recent Labs Lab 05/16/14 1601 05/17/14 0110 05/18/14 0330 05/20/14 1225 05/21/14 0400  NA 135* 134* 138 139 138  K 4.5 4.1 4.5 4.0 3.7  CL 95* 95* 96 96 95*  CO2 22 20 21  18* 19  GLUCOSE 137* 115* 210* 169* 170*  BUN 73* 73* 81* 51* 64*  CREATININE 5.73* 5.73* 6.30* 4.59* 5.50*  ALBUMIN  --   --  2.2*  --   --   CALCIUM 9.8 9.5 9.5 9.7 9.7  AST  --   --  20  --   --   ALT  --   --  9  --   --     Liver Function Tests:  Recent Labs Lab 05/18/14 0330  AST 20  ALT 9  ALKPHOS 75  BILITOT 0.3  PROT 6.1  ALBUMIN 2.2*   CBC:  Recent Labs Lab 05/16/14 1601 05/17/14 0110 05/18/14 0330 05/21/14 0400  WBC 22.0* 23.7* 19.5* 12.8*  NEUTROABS 19.6*  --   --    --   HGB 11.2* 10.4* 9.8* 10.7*  HCT 35.1* 32.4* 30.8* 33.3*  MCV 96.4 93.6 94.8 93.8  PLT 288 270 281 282    Cardiac Enzymes:  Recent Labs Lab 05/16/14 1955 05/17/14 0110 05/17/14 0718  TROPONINI <0.30 <0.30 <0.30    CBG:  Recent Labs Lab 05/20/14 1649 05/20/14 2025 05/20/14 2343 05/21/14 0327 05/21/14 0819  GLUCAP 131* 140* 149* 159* 180*    Iron Studies:  No results found for this basename: IRON, TIBC, TRANSFERRIN, FERRITIN,  in the last 72 hours  Studies/Results: Dg Chest Port 1 View  05/18/2014   CLINICAL DATA:   IMPRESSION: No pneumothorax following LEFT jugular line placement.  COPD changes with bibasilar atelectasis.   Electronically Signed   By: Lavonia Dana M.D.   On: 05/18/2014 14:44   . antiseptic oral rinse  15 mL Mouth Rinse BID  . antiseptic oral rinse  15 mL Mouth Rinse q12n4p  . aspirin EC  81 mg Oral Daily  . atorvastatin  80 mg Oral q1800  .  ceFEPime (MAXIPIME) IV  500 mg Intravenous Q24H  . chlorhexidine  15 mL Mouth Rinse BID  . clopidogrel  75 mg Oral Q breakfast  . doxazosin  4 mg Oral Daily  . enoxaparin (LOVENOX) injection  30 mg Subcutaneous Q24H  . feeding supplement (ENSURE COMPLETE)  237 mL Oral TID BM  . insulin aspart  0-9 Units Subcutaneous TID WC  . ipratropium-albuterol  3 mL Nebulization TID  . isosorbide mononitrate  60 mg Oral Daily  . mometasone-formoterol  2 puff Inhalation BID  . multivitamin  1 tablet Oral QHS  . pantoprazole  40 mg Oral Daily  . paricalcitol  1 mcg Oral QODAY  . sevelamer carbonate  800 mg Oral TID WC  . sodium chloride  3 mL Intravenous Q12H  . vancomycin  500 mg Intravenous Once    BMET:    Component Value Date/Time   NA 138 05/21/2014 0400   K 3.7 05/21/2014 0400   CL 95* 05/21/2014 0400   CO2 19 05/21/2014 0400   GLUCOSE 170* 05/21/2014 0400   BUN 64* 05/21/2014 0400   CREATININE 5.50* 05/21/2014 0400   CALCIUM 9.7 05/21/2014 0400   GFRNONAA 9* 05/21/2014 0400   GFRAA 10* 05/21/2014 0400     CBC:    Component Value Date/Time   WBC 12.8* 05/21/2014 0400   RBC 3.55* 05/21/2014 0400   HGB 10.7* 05/21/2014 0400   HCT 33.3* 05/21/2014 0400   PLT 282 05/21/2014 0400   MCV 93.8 05/21/2014 0400   MCH 30.1 05/21/2014 0400   MCHC 32.1 05/21/2014 0400   RDW 15.6* 05/21/2014 0400   LYMPHSABS 1.2 05/16/2014 1601   MONOABS 1.1* 05/16/2014 1601   EOSABS 0.1 05/16/2014 1601   BASOSABS 0.0 05/16/2014 1601     Assessment/Plan:  CKD Stage V-  Baseline creatinine ~5's. Pt is followed by Dr. Moshe Cipro.  Cr 4.59 yesterday-->5.5 today.  HD x 2; 2.5L removed x 2.  iPTH wnl. Palliative care following.  Cardiology not a candidate for a permanent pacemaker for complete heart block. Prognosis poor.  Wife would like to try one more HD session today with the understanding that he is not a candidate for long-term HD.  Pt also receiving duonebs tid which seem to offer some relief of SOB.   -HD today  -continue sevelamer 800mg  tid with meals, zemplar, renvela  ?HCAP-  On cefepime, vancomycin d/c -mgmt per primary   Acute on chronic diastolic CHF -28/78/6767 LVEF 20-94%, grade 1 diastolic dysfunction -mgmt per cardiology   Anemia of chronic disease- Baseline hgb 9-12. Iron <10. -consider feraheme  DM-  mgmt per primary   Complete heart block-  mgmt per cardiology, not a candidate for a permanent pacemaker    Jones Bales, MD Internal Medicine Teaching Service, PGY-2

## 2014-05-22 DIAGNOSIS — Z7189 Other specified counseling: Secondary | ICD-10-CM

## 2014-05-22 LAB — BASIC METABOLIC PANEL
Anion gap: 21 — ABNORMAL HIGH (ref 5–15)
BUN: 69 mg/dL — AB (ref 6–23)
CO2: 19 mEq/L (ref 19–32)
CREATININE: 5.72 mg/dL — AB (ref 0.50–1.35)
Calcium: 9.4 mg/dL (ref 8.4–10.5)
Chloride: 92 mEq/L — ABNORMAL LOW (ref 96–112)
GFR, EST AFRICAN AMERICAN: 10 mL/min — AB (ref >90)
GFR, EST NON AFRICAN AMERICAN: 8 mL/min — AB (ref >90)
Glucose, Bld: 363 mg/dL — ABNORMAL HIGH (ref 70–99)
Potassium: 3.6 mEq/L — ABNORMAL LOW (ref 3.7–5.3)
Sodium: 132 mEq/L — ABNORMAL LOW (ref 137–147)

## 2014-05-22 LAB — GLUCOSE, CAPILLARY
Glucose-Capillary: 243 mg/dL — ABNORMAL HIGH (ref 70–99)
Glucose-Capillary: 243 mg/dL — ABNORMAL HIGH (ref 70–99)
Glucose-Capillary: 210 mg/dL — ABNORMAL HIGH (ref 70–99)
Glucose-Capillary: 388 mg/dL — ABNORMAL HIGH (ref 70–99)
Glucose-Capillary: 234 mg/dL — ABNORMAL HIGH (ref 70–99)

## 2014-05-22 LAB — CBC
HCT: 31.6 % — ABNORMAL LOW (ref 39.0–52.0)
Hemoglobin: 10.2 g/dL — ABNORMAL LOW (ref 13.0–17.0)
MCH: 29.6 pg (ref 26.0–34.0)
MCHC: 32.3 g/dL (ref 30.0–36.0)
MCV: 91.6 fL (ref 78.0–100.0)
PLATELETS: 220 10*3/uL (ref 150–400)
RBC: 3.45 MIL/uL — ABNORMAL LOW (ref 4.22–5.81)
RDW: 15.1 % (ref 11.5–15.5)
WBC: 12.2 10*3/uL — ABNORMAL HIGH (ref 4.0–10.5)

## 2014-05-22 LAB — CULTURE, BLOOD (ROUTINE X 2)
CULTURE: NO GROWTH
Culture: NO GROWTH

## 2014-05-22 NOTE — Progress Notes (Signed)
I saw the patient and agree with the above assessment and plan.     Pt poorly tolerant of HD yesterday.  Transitioning to outpt/residential hospice.  Will s/o now. RS

## 2014-05-22 NOTE — Progress Notes (Signed)
[facility] PROGRESS NOTE  Raymond [name] TSV:[id number] DOB: 01-02-1930 DOA: 05/16/2014 PCP: Milagros Evener, MD  Assessment/Plan:   Complete heart block - Pt transferred to Cardiac Stepdown unit - Cardiology and EP specialist consulted. Per EP specialist's note patient is not a good candidate for PPM placement given all his co morbidities, respiratory distress, and CKD stage V. - Palliative team consulted and [place] being determined. Currently considering residential hospice once patient is ready for discharge.    Acute on chronic diastolic CHF (congestive heart failure), NYHA class 4 - Complicated by history of chronic kidney disease stage V.  - Nephrology on board and assisting with volume removal. After 3 dialysis sessions patient's breathing condition improved. -  Portable Chest x ray reporting Marked improvement in pulmonary edema    CKD (chronic kidney disease) stage 5, GFR less than 15 ml/min - Nephrology managing. Pt has had 3 dialysis sessions total - Currently spouse inquiring about the central line and when it can be removed. Will defer to nephrology  Diabetes mellitus - Dysphagia I, diabetic diet - Patient currently on sliding scale insulin  HCAP - Currently on Cefepime. WBC trending down. Discontinue antibiotic regimen after today's dose. - WBC levels trending down.  I don't have sputum cultures to help assist with narrowing antibiotic spectrum and I would like to have speech therapy evaluation prior to changing to oral antibiotic regimen.  Discontinue Vancomycin at this juncture and continue cefepime.  Code Status: DNR Family Communication: discussed with wife at bedside  Disposition Plan: Palliative on board and plans are for residential hospice.   Consultants:  Cardiology  EP  Nephrology  Palliative care team  Antibiotics:  Cefepime and   Vancomycin>>>7/13  HPI/Subjective: Pt is more somnolent today but woke up to ask me where he would go  after being discharged from the hospital.  Objective: Filed Vitals:   05/22/14 0838  BP: 178/48  Pulse: 55  Temp: 97.6 F (36.4 C)  Resp: 21    Intake/Output Summary (Last 24 hours) at 05/22/14 0951 Last data filed at 05/22/14 0500  Gross per 24 hour  Intake     50 ml  Output    624 ml  Net   -574 ml   Filed Weights   05/18/14 1939 05/19/14 1745 05/19/14 2030  Weight: 62.5 kg (137 lb 12.6 oz) 65.6 kg (144 lb 10 oz) 59.3 kg (130 lb 11.7 oz)    Exam:   General:  Alert and awake, in NAD  Cardiovascular: + S1 and S2, no rubs  Respiratory: clear to auscultation, decreased breath sounds at bases, no wheezes  Abdomen: soft, ND  Musculoskeletal: no clubbing   Data Reviewed: Basic Metabolic Panel:  Recent Labs Lab 05/17/14 0110 05/18/14 0330 05/20/14 1225 05/21/14 0400 05/22/14 0035  NA 134* 138 139 138 132*  K 4.1 4.5 4.0 3.7 3.6*  CL 95* 96 96 95* 92*  CO2 20 21 18* 19 19  GLUCOSE 115* 210* 169* 170* 363*  BUN 73* 81* 51* 64* 69*  CREATININE 5.73* 6.30* 4.59* 5.50* 5.72*  CALCIUM 9.5 9.5 9.7 9.7 9.4  MG  --  2.3  --   --   --    Liver Function Tests:  Recent Labs Lab 05/18/14 0330  AST 20  ALT 9  ALKPHOS 75  BILITOT 0.3  PROT 6.1  ALBUMIN 2.2*   No results found for this basename: LIPASE, AMYLASE,  in the last 168 hours No results found for this basename: AMMONIA,  in  the last 168 hours CBC:  Recent Labs Lab 05/16/14 1601 05/17/14 0110 05/18/14 0330 05/21/14 0400 05/22/14 0035  WBC 22.0* 23.7* 19.5* 12.8* 12.2*  NEUTROABS 19.6*  --   --   --   --   HGB 11.2* 10.4* 9.8* 10.7* 10.2*  HCT 35.1* 32.4* 30.8* 33.3* 31.6*  MCV 96.4 93.6 94.8 93.8 91.6  PLT 288 270 281 282 220   Cardiac Enzymes:  Recent Labs Lab 05/16/14 1955 05/17/14 0110 05/17/14 0718  TROPONINI <0.30 <0.30 <0.30   BNP (last 3 results)  Recent Labs  04/21/14 0845 05/16/14 1955  PROBNP 5741.0* 21955.0*   CBG:  Recent Labs Lab 05/21/14 1336 05/21/14 2018  05/21/14 2329 05/22/14 0330 05/22/14 0837  GLUCAP 205* 341* 343* 243* 243*    Recent Results (from the past 240 hour(s))  URINE CULTURE     Status: None   Collection Time    05/16/14  5:39 PM      Result Value Ref Range Status   Specimen Description URINE, CATHETERIZED   Final   Special Requests NONE   Final   Culture  Setup Time     Final   Value: 05/16/2014 18:04     Performed at [facility]     Final   Value: >=100,000 COLONIES/ML     Performed at [facility]   Culture     Final   Value: Multiple bacterial morphotypes present, none predominant. Suggest appropriate recollection if clinically indicated.     Performed at [facility]   Report Status 05/17/2014 FINAL   Final  CULTURE, BLOOD (ROUTINE X 2)     Status: None   Collection Time    05/16/14  5:40 PM      Result Value Ref Range Status   Specimen Description BLOOD RIGHT ARM   Final   Special Requests BOTTLES DRAWN AEROBIC AND ANAEROBIC [facility]   Final   Culture  Setup Time     Final   Value: 05/16/2014 21:56     Performed at [facility]   Culture     Final   Value: NO GROWTH 5 DAYS     Performed at [facility]   Report Status 05/22/2014 FINAL   Final  CULTURE, BLOOD (ROUTINE X 2)     Status: None   Collection Time    05/16/14  5:55 PM      Result Value Ref Range Status   Specimen Description BLOOD LEFT WRIST   Final   Special Requests BOTTLES DRAWN AEROBIC AND ANAEROBIC 5CC   Final   Culture  Setup Time     Final   Value: 05/16/2014 21:56     Performed at [facility]   Culture     Final   Value: NO GROWTH 5 DAYS     Performed at [facility]   Report Status 05/22/2014 FINAL   Final  MRSA PCR SCREENING     Status: None   Collection Time    05/17/14 10:48 AM      Result Value Ref Range Status   MRSA by PCR NEGATIVE  NEGATIVE Final   Comment:            The GeneXpert MRSA Assay (FDA     approved for NASAL specimens     only), is one  component of a     comprehensive MRSA colonization     surveillance program. It is not  intended to diagnose MRSA     infection nor to guide or     monitor treatment for     MRSA infections.     Studies: Dg Chest Port 1 View  05/21/2014   CLINICAL DATA:  Congestive heart failure. The patient has now undergone dialysis for volume removal.  EXAM: PORTABLE CHEST - 1 VIEW  COMPARISON:  05/18/2013  FINDINGS: Double lumen catheter tip is at the superior vena cava at the level of the azygos vein. Interstitial edema has markedly improved. There still some Kerley B-lines at both lung bases. COPD. Heart size and vascularity are normal.  IMPRESSION: Marked improvement in pulmonary edema with minimal interstitial edema remaining at the bases.   Electronically Signed   By: Rozetta Nunnery M.D.   On: 05/21/2014 07:17    Scheduled Meds: . antiseptic oral rinse  15 mL Mouth Rinse BID  . antiseptic oral rinse  15 mL Mouth Rinse q12n4p  . aspirin EC  81 mg Oral Daily  . atorvastatin  80 mg Oral q1800  . ceFEPime (MAXIPIME) IV  500 mg Intravenous Q24H  . clopidogrel  75 mg Oral Q breakfast  . doxazosin  4 mg Oral Daily  . enoxaparin (LOVENOX) injection  30 mg Subcutaneous Q24H  . insulin aspart  0-9 Units Subcutaneous TID WC  . ipratropium-albuterol  3 mL Nebulization TID  . isosorbide mononitrate  60 mg Oral Daily  . mometasone-formoterol  2 puff Inhalation BID  . multivitamin  1 tablet Oral QHS  . pantoprazole  40 mg Oral Daily  . paricalcitol  1 mcg Oral QODAY  . sevelamer carbonate  800 mg Oral TID WC  . sodium chloride  3 mL Intravenous Q12H   Continuous Infusions:    Time spent: > 35 minutes    Velvet Bathe  [facility] Pager [phone]. If 7PM-7AM, please contact night-coverage at [email or web address], password [facility] 05/22/2014, 9:51 AM  LOS: 6 days

## 2014-05-22 NOTE — Progress Notes (Signed)
MDs:  Please refer to my assessment- patient feels that he is not being included in on discussions re: palliative/hospice care and would not fully agree during visit for placement at [facility] or other residential hospice. He finally agreed, however, to allow CSW to make referral to [facility] but continues to speak of wanting to go home with hospice care. He is able to clearly verbalize an understanding of his end stage conditions and does not want to continue dialysis.  He speaks openly of his desire for comfort care.  His wife wants him to go to [facility] as she does not feel she can manage his care at home.  CSW will follow up with patient and family tomorrow as well to with [place].  Lorie Phenix. Pauline Good, [place]

## 2014-05-22 NOTE — Clinical Social Work Psychosocial (Signed)
     Clinical Social Work Department BRIEF PSYCHOSOCIAL ASSESSMENT 05/22/2014  Patient:  Raymond [name], Raymond [name]     Account Number:  [id number]     Admit date:  05/16/2014  Clinical Social Worker:  Iona Coach  Date/Time:  05/22/2014 02:00 PM  Referred by:  Care Management  Date Referred:  05/22/2014 Referred for  Residential hospice placement   Other Referral:   Interview type:  Other - See comment Other interview type:   Patient, wife and son [name]    PSYCHOSOCIAL DATA Living Status:  WIFE Admitted from facility:   Level of care:   Primary support name:   Primary support relationship to patient:  SPOUSE Degree of support available:   Strong support  both sons are supportive as well    CURRENT CONCERNS Current Concerns  Other - See comment   Other Concerns:   Residential hospice vs home with hospice    SOCIAL WORK ASSESSMENT / PLAN CSW received referral to talk to patient and wife about residential hospice options.  CSW met with patient, wife and son [name] this afternoon to provide residential hospice home list as well as to answer questions/concerns. Per [place]- family's preference for residential hospice is [facility].  As CSW was talking to patient and family- patient became very angry stating that everyone has been "talking over him" and no one has consulted him about what he wants to do.   CSW spoke directly with patient throughout the meeting today and assured him that he was his own decision maker and that all discussions would include him.  Patient spoke about wanting to go home and to "die at home"- he is aware of his prognosis and wants to go home where he can watch his 50" TV and be at peace.  CSW met with patient, wife and son for over an hour- wife was able to verbalize her concerns over her ability to manage patient at home. Patient remained fixated on his feelings about not being consulted about his d/c plan despite CSW's support and  encouragement.  Patient finally agreed for CSW to make a referral to [facility] but he is not firm on his decision yet to go to inpatient care.  CSW discussed need for back up plan to either home or [facility]- family's second choice is [facility] but wife feels that this would be a great burden on her due to the drive. Disucssed that per Palliative Care MD -patient is stable for move to a residential hospice.  CSW notified Erling Conte- LCSW [place] for [facility] and she will follow up with patient and wife this evening to provide further information about hospice care.   Assessment/plan status:  Psychosocial Support/Ongoing Assessment of Needs Other assessment/ plan:   Information/referral to community resources:   Residential Hospice Home list provided to patient and wife    PATIENTS/FAMILYS RESPONSE TO PLAN OF CARE: As stated above- patient was very angry during today's visit as he feels like he has not been involved in the decision making for residental hospice referral.  He feels that he has been "talked over and discarded" and CSW made every effort to make patient feel that his input was vital to the decision making.  At the end of the discussion, CSW asked patient, wife and son to discuss their options and will re-evaluate in the a.m.  Per patient's request- he will talk with [facility] SW further.

## 2014-05-22 NOTE — Progress Notes (Signed)
Patient ID: Raymond [name] male   DOB: 05-13-1930 80 y.o.   MRN: [id number]  S: Pt appears better since I saw him yesterday.  His breathing seems to have improved slightly and is saturating in the mid-90s on 2L [place].  Has received 3 dialysis treatments previously (last yesterday terminated early after unable to tolerate.  Poor UOP: 310 ml yesterday. Cr 5.72 today/ BUN 69.     O:  VS: BP 178/48  Pulse 55  Temp(Src) 97.6 F (36.4 C) (Oral)  Resp 21  Ht 5\' 6"  (1.676 m)  Wt 130 lb 11.7 oz (59.3 kg)  BMI 21.11 kg/m2  SpO2 94% on 5L [place]  Intake/Output:  Intake/Output Summary (Last 24 hours) at 05/22/14 0932 Last data filed at 05/22/14 0500  Gross per 24 hour  Intake     50 ml  Output    624 ml  Net   -574 ml    Weight change: Filed Weights   05/18/14 1939 05/19/14 1745 05/19/14 2030  Weight: 137 lb 12.6 oz (62.5 kg) 144 lb 10 oz (65.6 kg) 130 lb 11.7 oz (59.3 kg)    Physical Exam: Constitutional: Vital signs reviewed. Elderly male appears acutely ill, pale.   HEENT: Pale conjunctivae Neck: Supple, trachea midline Cardiovascular:irregular  Pulmonary/Chest: CTAB Abdominal: Soft. Non-tender, non-distended, bowel sounds are normal  Extremities: No LE edema Neurological: Alert, moves all extremities voluntarily, asterixis improved  Skin: Warm, dry and intact. No rash.   Recent Labs Lab 05/16/14 1601 05/17/14 0110 05/18/14 0330 05/20/14 1225 05/21/14 0400 05/22/14 0035  NA 135* 134* 138 139 138 132*  K 4.5 4.1 4.5 4.0 3.7 3.6*  CL 95* 95* 96 96 95* 92*  CO2 22 20 21  18* 19 19  GLUCOSE 137* 115* 210* 169* 170* 363*  BUN 73* 73* 81* 51* 64* 69*  CREATININE 5.73* 5.73* 6.30* 4.59* 5.50* 5.72*  ALBUMIN  --   --  2.2*  --   --   --   CALCIUM 9.8 9.5 9.5 9.7 9.7 9.4  AST  --   --  20  --   --   --   ALT  --   --  9  --   --   --     Liver Function Tests:  Recent Labs Lab 05/18/14 0330  AST 20  ALT 9  ALKPHOS 75  BILITOT 0.3  PROT 6.1  ALBUMIN 2.2*    CBC:  Recent Labs Lab 05/16/14 1601 05/17/14 0110 05/18/14 0330 05/21/14 0400 05/22/14 0035  WBC 22.0* 23.7* 19.5* 12.8* 12.2*  NEUTROABS 19.6*  --   --   --   --   HGB 11.2* 10.4* 9.8* 10.7* 10.2*  HCT 35.1* 32.4* 30.8* 33.3* 31.6*  MCV 96.4 93.6 94.8 93.8 91.6  PLT 288 270 281 282 220    Cardiac Enzymes:  Recent Labs Lab 05/16/14 1955 05/17/14 0110 05/17/14 0718  TROPONINI <0.30 <0.30 <0.30    CBG:  Recent Labs Lab 05/21/14 1336 05/21/14 2018 05/21/14 2329 05/22/14 0330 05/22/14 0837  GLUCAP 205* 341* 343* 243* 243*    Iron Studies:  No results found for this basename: IRON, TIBC, TRANSFERRIN, FERRITIN,  in the last 72 hours  Studies/Results: Dg Chest Port 1 View  05/18/2014   CLINICAL DATA:   IMPRESSION: No pneumothorax following LEFT jugular line placement.  COPD changes with bibasilar atelectasis.   Electronically Signed   By: Lavonia Dana M.D.   On: 05/18/2014 14:44   . antiseptic oral  rinse  15 mL Mouth Rinse BID  . antiseptic oral rinse  15 mL Mouth Rinse q12n4p  . aspirin EC  81 mg Oral Daily  . atorvastatin  80 mg Oral q1800  . ceFEPime (MAXIPIME) IV  500 mg Intravenous Q24H  . clopidogrel  75 mg Oral Q breakfast  . doxazosin  4 mg Oral Daily  . enoxaparin (LOVENOX) injection  30 mg Subcutaneous Q24H  . insulin aspart  0-9 Units Subcutaneous TID WC  . ipratropium-albuterol  3 mL Nebulization TID  . isosorbide mononitrate  60 mg Oral Daily  . mometasone-formoterol  2 puff Inhalation BID  . multivitamin  1 tablet Oral QHS  . pantoprazole  40 mg Oral Daily  . paricalcitol  1 mcg Oral QODAY  . sevelamer carbonate  800 mg Oral TID WC  . sodium chloride  3 mL Intravenous Q12H    BMET:    Component Value Date/Time   NA 132* 05/22/2014 0035   K 3.6* 05/22/2014 0035   CL 92* 05/22/2014 0035   CO2 19 05/22/2014 0035   GLUCOSE 363* 05/22/2014 0035   BUN 69* 05/22/2014 0035   CREATININE 5.72* 05/22/2014 0035   CALCIUM 9.4 05/22/2014 0035   GFRNONAA  8* 05/22/2014 0035   GFRAA 10* 05/22/2014 0035    CBC:    Component Value Date/Time   WBC 12.2* 05/22/2014 0035   RBC 3.45* 05/22/2014 0035   HGB 10.2* 05/22/2014 0035   HCT 31.6* 05/22/2014 0035   PLT 220 05/22/2014 0035   MCV 91.6 05/22/2014 0035   MCH 29.6 05/22/2014 0035   MCHC 32.3 05/22/2014 0035   RDW 15.1 05/22/2014 0035   LYMPHSABS 1.2 05/16/2014 1601   MONOABS 1.1* 05/16/2014 1601   EOSABS 0.1 05/16/2014 1601   BASOSABS 0.0 05/16/2014 1601     Assessment/Plan:  CKD Stage V-  Baseline creatinine ~5's. Pt is followed by Dr. Moshe Cipro.  Cr 5.5--> 5.72 today.  HD x 3; 2.5L removed x 2, yesterday removed only 382ml.  iPTH wnl. Palliative care following.  Cardiology not a candidate for a permanent pacemaker for complete heart block. Prognosis poor.     -would consider d/c sevelamer 800mg  tid, zemplar, renvela -plan to transition to residential vs. inpatient hospice  -renal will sign off  ?HCAP-  On cefepime, vancomycin d/c -mgmt per primary   Acute on chronic diastolic CHF -04/88/8916 LVEF 94-50%, grade 1 diastolic dysfunction -mgmt per cardiology   Anemia of chronic disease- Baseline hgb 9-12. Iron <10. -monitor  DM-  mgmt per primary   Complete heart block-  mgmt per cardiology, not a candidate for a permanent pacemaker    Jones Bales, MD Internal Medicine Teaching Service, PGY-2

## 2014-05-22 NOTE — Progress Notes (Signed)
Inpatient Diabetes Program Recommendations  AACE/ADA: New Consensus Statement on Inpatient Glycemic Control (2013)  Target Ranges:  Prepandial:   less than 140 mg/dL      Peak postprandial:   less than 180 mg/dL (1-2 hours)      Critically ill patients:  140 - 180 mg/dL  Results for SANJIV, CASTORENA (MRN [id number]) as of 05/22/2014 09:42  Ref. Range 05/21/2014 13:36 05/21/2014 20:18 05/21/2014 23:29 05/22/2014 03:30 05/22/2014 08:37  Glucose-Capillary Latest Range: 70-99 mg/dL 205 (H) 341 (H) 343 (H) 243 (H) 243 (H)   Inpatient Diabetes Program Recommendations Insulin - Basal: start Lantus or Levemir 10 units daily Thank you  Raoul Pitch BSN, RN,CDE Inpatient Diabetes Coordinator [phone] (team pager)

## 2014-05-22 NOTE — Progress Notes (Addendum)
Patient [name]      DOB: 09-Oct-1930      YPP:[id number]   Palliative Medicine Team at [facility] Progress Note    Subjective: More sleepy today.  Denies pain or dyspnea. Less interactive than yesterday. Events on dialysis reviewed.  Wife at bedside. Spoke with her and confirmed plan of care to pursue residential hospice.  She still wonders about being able to take him home. We discussed some of the challenges that this may possess, especially as Raymond [name]'s breathing declines as we would expect from dialysis no longer being pursued.     Filed Vitals:   05/22/14 0838  BP: 178/48  Pulse: 55  Temp: 97.6 F (36.4 C)  Resp: 21   Physical exam: GEN: more drowsy today, NAD, appears to be breathing comfortablly CV: mild bradycardia LUNGS: CTAB anteriorly, symm exp ABD: soft, NT EXT: edema improved    Assessment/Recommendations:  80 yo male with multiple medical problems including CKD V with AKI, Severe AS, Diastolic Dysfx, HTN, DM who presented on 05/16/14 with SOB. Found to have suspected HCAP with worsening renal function necessitating dialysis, complete heart block, and acute respiratory failure.  1. Code Status: DNR   2. Goals of Care-  -See previous notes. Family wishing to pursue residential hospice.  They wonder about home hospice care, and we talked about some of the challenges this could potentially present for Raymond [name] (especially overnight with wife having hearing impairment).  They would like to speak with hospice agency about residential hospice care.  I have spoken to SW this morning who will work on referral process. Appreciate assistance.   3. Symptom Management:  A. Dyspnea- PRN fentanyl. If needing frequent bolus dosing can also pursue drip. Things well controlled currently with minimal PRN needs. Suspect this will change in coming days with expected decline.  B. Pill Burden- refusing some meds. Would consider d/c'ing meds that would likely only have long-term  benefits such as ASA/Plavix, statin, etc   4. Psychosocial: Lives at home with his wife whom is hard of hearing but able to read lips well. They also receive help from family friend Alberteen Sam whom is a PA. Family here from out of town.   Time In  Time Out  Total Time Spent with Patient  Total Overall Time   900 930  15 min  30 min    Greater than 50% of this time was spent counseling and coordinating care related to the above assessment and plan.   Doran Clay D.O.  Palliative Medicine Team at [facility]  Team Phone: [phone]   ADDENDUM: Called by RN this afternoon as [name] is more awake.  He wanted to discuss more about what is going on with discharge plans and what his current medical issues are.  He stated preference to family that he would like to go home. Met with [name], his wife, and son. Reviewed current medical issues regarding his CHF, severe AS, AKI on CKD in setting of COPD.  Raymond [name] knows that his time is short and confirms he wants his care to focus on comfort. Even if dialysis were offered again, he tells me he would not proceed with it.  He stated that he would like to pursue home hospice care and we talked about concerns I and his family would have with this.  I think the most paramount concern is that he could have rapid respiratoyr decline which may require IV medications to control.  Talked about how hospice  would be there at home to support him with occaisonal nurse visits and keep meds in home for comfort, but I worried that if his resp status declined rapidly, he could have uncomfortable passing at home. Other concern relates to his family being able to care for him when he is at home. His wife [name] has hearing impairment and may not be able to hear him if he called for help.  Raymond [name] ultimately stated that his life is in God's hands and he would really like to be able to pass away peacefully in his recliner at home.  He does have concerns that his passing may be  difficult on him as well as [name] if he were to go home. We talked about a possible compromise of inpatient hospice for several days to see how he was doing (rapid decline vs more gradual).  He seemed open to this idea.  Family would like to talk about this more and also talk with hospice representative about  Home vs residential hospice care.    Time In 2: 345 Time Out 2: 440  Total time between 2 visits: 85 minutes

## 2014-05-23 LAB — BASIC METABOLIC PANEL
Anion gap: 22 — ABNORMAL HIGH (ref 5–15)
BUN: 77 mg/dL — AB (ref 6–23)
CO2: 19 mEq/L (ref 19–32)
CREATININE: 6.81 mg/dL — AB (ref 0.50–1.35)
Calcium: 9.4 mg/dL (ref 8.4–10.5)
Chloride: 92 mEq/L — ABNORMAL LOW (ref 96–112)
GFR, EST AFRICAN AMERICAN: 8 mL/min — AB (ref >90)
GFR, EST NON AFRICAN AMERICAN: 7 mL/min — AB (ref >90)
Glucose, Bld: 235 mg/dL — ABNORMAL HIGH (ref 70–99)
Potassium: 3.4 mEq/L — ABNORMAL LOW (ref 3.7–5.3)
Sodium: 133 mEq/L — ABNORMAL LOW (ref 137–147)

## 2014-05-23 LAB — GLUCOSE, CAPILLARY: Glucose-Capillary: 222 mg/dL — ABNORMAL HIGH (ref 70–99)

## 2014-05-23 MED ORDER — FENTANYL CITRATE 0.05 MG/ML IJ SOLN: 12.5000 ug | INTRAMUSCULAR | Status: AC | PRN

## 2014-05-23 MED ORDER — ENSURE COMPLETE PO LIQD: 237.0000 mL | Freq: Three times a day (TID) | ORAL | Status: AC | PRN

## 2014-05-23 MED ORDER — INSULIN NPH (HUMAN) (ISOPHANE) 100 UNIT/ML ~~LOC~~ SUSP: 10.0000 [IU] | Freq: Every day | SUBCUTANEOUS | Status: AC

## 2014-05-23 MED ORDER — TRAMADOL HCL 50 MG PO TABS: 50.0000 mg | ORAL_TABLET | Freq: Four times a day (QID) | ORAL | Status: DC | PRN

## 2014-05-23 NOTE — Consult Note (Signed)
[place] Liaison: Received request from [facility] to meet with family to explain services and confirm interest. Met with patient, spouse and two sons yesterday afternoon. After much discussion patient confirmed desire to transfer to [facility] today. Dr. Jordan Hawks Rankins confirmed to attend per patient request.   Met with patient, spouse and son Sonia Side to confirm plan to transfer today. All agreeable and spouse completed admission paperwork. Please fax discharge summary to [phone]. RN please call report to [phone]. Please arrange transport for patient to arrive by noon if possible. Thank you. Erling Conte LCSW [phone]

## 2014-05-23 NOTE — Progress Notes (Addendum)
Dialysis cath/central line d/c'd by Erin,RN CN at 12:15, site without any problem, pt has been on bedrest post removal for 30-45mins.  Report called to Helene Kelp, RN at [facility] [phone]) where pt will be going today. Family at bedside. SW Butch Penny notified of line removal and site without problems, she will notify transportation for pt transfer to [facility].

## 2014-05-23 NOTE — Discharge Summary (Addendum)
Physician Discharge Summary  Raymond [name] JKD:[id number] DOB: Feb 27, 1930 DOA: 05/16/2014  PCP: Milagros Evener, MD  Admit date: 05/16/2014 Discharge date: 05/23/2014  Time spent: > 35 minutes  Recommendations for Outpatient Follow-up:  1. Hospice MD to continue comfort care measures  Discharge Diagnoses:  Active Problems:   HTN (hypertension)   HCAP (healthcare-associated pneumonia)   CKD (chronic kidney disease) stage 5, GFR less than 15 ml/min   Bradycardia   Chronic diastolic CHF (congestive heart failure)   Complete heart block   Acute on chronic diastolic CHF (congestive heart failure), NYHA class 4   Discharge Condition: stable  Diet recommendation: carb modified diet  Filed Weights   05/18/14 1939 05/19/14 1745 05/19/14 2030  Weight: 62.5 kg (137 lb 12.6 oz) 65.6 kg (144 lb 10 oz) 59.3 kg (130 lb 11.7 oz)    History of present illness:  From original HPI: 80 year old man with a history of CAD, CKD stage V, aortic stenosis, COPD, hypertension, diabetes mellitus presents with one-week history of worsening shortness of breath and cough. The patient was discharged from the hospital on 05/03/2014 after treatment for NSTEMI. He had heart cath x 2 with DES to RCA and discharged with aspirin and Plavix.   Hospital Course:  Complete heart block  - Pt transferred to Cardiac Stepdown unit  - Cardiology and EP specialist consulted. Per EP specialist's note patient is not a good candidate for PPM placement given all his co morbidities, respiratory distress, and CKD stage V.  - Palliative team consulted and [place] being determined. Based on their evaluation: 2. Goals of Care-  -See previous notes. Family wishing to pursue residential hospice. They wonder about home hospice care, and we talked about some of the challenges this could potentially present for [place] (especially overnight with wife having hearing impairment). They would like to speak with hospice agency about residential  hospice care. I have spoken to SW this morning who will work on referral process. Appreciate assistance.  3. Symptom Management:  A. Dyspnea- PRN fentanyl. If needing frequent bolus dosing can also pursue drip. Things well controlled currently with minimal PRN needs. Suspect this will change in coming days with expected decline.  B. Pill Burden- refusing some meds. Would consider d/c'ing meds that would likely only have long-term benefits such as ASA/Plavix, statin, etc   Acute on chronic diastolic CHF (congestive heart failure), NYHA class 4  - Complicated by history of chronic kidney disease stage V.  - Nephrology on board and assisting with volume removal. After 3 dialysis sessions patient's breathing condition improved.  - Portable Chest x ray reporting Marked improvement in pulmonary edema  CKD (chronic kidney disease) stage 5, GFR less than 15 ml/min  - Nephrology managing. Pt has had 3 dialysis sessions total   Diabetes mellitus  - Dysphagia I, diabetic diet  - Patient currently on sliding scale insulin   HCAP  - Has completed a 7 day total course.   Procedures:  As listed above  Consultations: Cardiology  EP  Nephrology  Palliative care team   Discharge Exam: Filed Vitals:   05/23/14 0829  BP:   Pulse:   Temp: 98.9 F (37.2 C)  Resp:     General: Pt in NAD, alert and awake Cardiovascular: non cyanotic extremities, + edema Respiratory: No wheezes, decreased breath sounds at bases  Discharge Instructions You were cared for by a hospitalist during your hospital stay. If you have any questions about your discharge medications or the care you received while  you were in the hospital after you are discharged, you can call the unit and asked to speak with the hospitalist on call if the hospitalist that took care of you is not available. Once you are discharged, your primary care physician will handle any further medical issues. Please note that NO REFILLS for any  discharge medications will be authorized once you are discharged, as it is imperative that you return to your primary care physician (or establish a relationship with a primary care physician if you do not have one) for your aftercare needs so that they can reassess your need for medications and monitor your lab values.  Discharge Instructions   Call MD for:  persistant nausea and vomiting    Complete by:  As directed      Call MD for:  severe uncontrolled pain    Complete by:  As directed      Call MD for:  temperature >100.4    Complete by:  As directed      Diet - low sodium heart healthy    Complete by:  As directed      Increase activity slowly    Complete by:  As directed             Medication List    STOP taking these medications       aspirin EC 81 MG tablet     atorvastatin 80 MG tablet  Commonly known as:  LIPITOR     clopidogrel 75 MG tablet  Commonly known as:  PLAVIX     docusate sodium 100 MG capsule  Commonly known as:  COLACE     multivitamins ther. w/minerals Tabs tablet      TAKE these medications       albuterol 108 (90 BASE) MCG/ACT inhaler  Commonly known as:  PROVENTIL HFA;VENTOLIN HFA  - Inhale 2 puffs into the lungs every 6 (six) hours as needed. For wheeze or shortness of breath  -      doxazosin 4 MG tablet  Commonly known as:  CARDURA  Take 4 mg by mouth daily.     epoetin alfa 10000 UNIT/ML injection  Commonly known as:  EPOGEN,PROCRIT  Inject 20,000 Units into the skin every 14 (fourteen) days.     feeding supplement (ENSURE COMPLETE) Liqd  Take 237 mLs by mouth 3 (three) times daily as needed (provide as able).     Fluticasone-Salmeterol 100-50 MCG/DOSE Aepb  Commonly known as:  ADVAIR  Inhale 1 puff into the lungs every 12 (twelve) hours.     furosemide 80 MG tablet  Commonly known as:  LASIX  Take 80 mg by mouth 2 (two) times daily.     GLUCAGON EMERGENCY 1 MG injection  Generic drug:  glucagon     insulin aspart 100  UNIT/ML injection  Commonly known as:  novoLOG  - Inject 3-4 Units into the skin 2 (two) times daily. 3 units at breakfast, 4 units at dinner iIf dinner contains starches. If dinner is a small meal, use 3 units.  - If blood sugar is over 200 before breakfast or before dinner, add an additional 1 unit.     insulin NPH Human 100 UNIT/ML injection  Commonly known as:  HUMULIN N,NOVOLIN N  Inject 0.1 mLs (10 Units total) into the skin daily before breakfast.     isosorbide mononitrate 60 MG 24 hr tablet  Commonly known as:  IMDUR  Take 60 mg by mouth daily.  lipase/protease/amylase 12000 UNITS Cpep capsule  Commonly known as:  CREON-12/PANCREASE  2 capsules 3 (three) times daily with meals. 2 before meals (6 daily)     metoprolol 50 MG tablet  Commonly known as:  LOPRESSOR  Take 50 mg by mouth 2 (two) times daily.     nitroGLYCERIN 0.4 MG SL tablet  Commonly known as:  NITROSTAT  Place 1 tablet (0.4 mg total) under the tongue every 5 (five) minutes as needed for chest pain.     omeprazole 20 MG capsule  Commonly known as:  PRILOSEC  Take 20 mg by mouth daily.     paricalcitol 1 MCG capsule  Commonly known as:  ZEMPLAR  Take 1 mcg by mouth every other day.     quiNINE 324 MG capsule  Commonly known as:  QUALAQUIN  2 caps by mouth every evening     sevelamer carbonate 800 MG tablet  Commonly known as:  RENVELA  Take 1 tablet (800 mg total) by mouth 3 (three) times daily with meals.     tiotropium 18 MCG inhalation capsule  Commonly known as:  SPIRIVA  Place 18 mcg into inhaler and inhale daily.     traMADol 50 MG tablet  Commonly known as:  ULTRAM  Take 1 tablet (50 mg total) by mouth every 6 (six) hours as needed for moderate pain. Maximum dose= 8 tablets per day       Allergies  Allergen Reactions  . Codeine Nausea And Vomiting  . Dilaudid [Hydromorphone Hcl] Nausea And Vomiting    "I get deathly sick"; diaphoresis      The results of significant  diagnostics from this hospitalization (including imaging, microbiology, ancillary and laboratory) are listed below for reference.    Significant Diagnostic Studies: Dg Chest 2 View  05/16/2014   CLINICAL DATA:  Shortness of breath, cough, congestion.  EXAM: CHEST  2 VIEW  COMPARISON:  04/24/2014  FINDINGS: Prior CABG. Heart is upper limits normal size. Diffuse interstitial prominence throughout the lungs, most pronounced in the lower lobes where there are more confluent opacities. Small bilateral pleural effusions.  No acute bony abnormality.  IMPRESSION: Increasing interstitial prominence and bibasilar opacities. Small bilateral effusions. Findings could represent edema or infection.   Electronically Signed   By: Rolm Baptise M.D.   On: 05/16/2014 17:16   Dg Chest Port 1 View  05/21/2014   CLINICAL DATA:  Congestive heart failure. The patient has now undergone dialysis for volume removal.  EXAM: PORTABLE CHEST - 1 VIEW  COMPARISON:  05/18/2013  FINDINGS: Double lumen catheter tip is at the superior vena cava at the level of the azygos vein. Interstitial edema has markedly improved. There still some Kerley B-lines at both lung bases. COPD. Heart size and vascularity are normal.  IMPRESSION: Marked improvement in pulmonary edema with minimal interstitial edema remaining at the bases.   Electronically Signed   By: Rozetta Nunnery M.D.   On: 05/21/2014 07:17   Dg Chest Port 1 View  05/18/2014   CLINICAL DATA:  LEFT jugular line placement, history COPD, hypertension, coronary artery disease, diabetes, colon cancer  EXAM: PORTABLE CHEST - 1 VIEW  COMPARISON:  Portable exam 1427 hr compared to 05/16/2014  FINDINGS: New LEFT jugular central venous catheter with tip projecting over confluence of LEFT brachiocephalic vein and SVC.  Upper normal heart size post CABG.  Mediastinal contours and pulmonary vascularity normal.  Atherosclerotic calcification aorta.  COPD changes with bibasilar atelectasis versus infiltrate  little changed.  Minimal RIGHT apical scarring.  No pleural effusion or pneumothorax.  Bones demineralized.  IMPRESSION: No pneumothorax following LEFT jugular line placement.  COPD changes with bibasilar atelectasis.   Electronically Signed   By: Lavonia Dana M.D.   On: 05/18/2014 14:44   Dg Chest Port 1 View  04/24/2014   CLINICAL DATA:  Status post placement dialysis catheter.  EXAM: PORTABLE CHEST - 1 VIEW  COMPARISON:  PA and lateral chest 04/21/2014.  FINDINGS: Right IJ approach central venous catheter is in place with the tips in the mid superior vena cava. There is no pneumothorax. The lungs appear emphysematous. The patient is status post CABG. Heart size is upper normal.  IMPRESSION: Right IJ catheter tip projects over the mid superior vena cava. Negative for pneumothorax.  No acute cardiopulmonary disease.  Emphysema.   Electronically Signed   By: Inge Rise M.D.   On: 04/24/2014 16:57    Microbiology: Recent Results (from the past 240 hour(s))  URINE CULTURE     Status: None   Collection Time    05/16/14  5:39 PM      Result Value Ref Range Status   Specimen Description URINE, CATHETERIZED   Final   Special Requests NONE   Final   Culture  Setup Time     Final   Value: 05/16/2014 18:04     Performed at [facility]     Final   Value: >=100,000 COLONIES/ML     Performed at [facility]   Culture     Final   Value: Multiple bacterial morphotypes present, none predominant. Suggest appropriate recollection if clinically indicated.     Performed at [facility]   Report Status 05/17/2014 FINAL   Final  CULTURE, BLOOD (ROUTINE X 2)     Status: None   Collection Time    05/16/14  5:40 PM      Result Value Ref Range Status   Specimen Description BLOOD RIGHT ARM   Final   Special Requests BOTTLES DRAWN AEROBIC AND ANAEROBIC [facility]   Final   Culture  Setup Time     Final   Value: 05/16/2014 21:56     Performed at [facility]    Culture     Final   Value: NO GROWTH 5 DAYS     Performed at [facility]   Report Status 05/22/2014 FINAL   Final  CULTURE, BLOOD (ROUTINE X 2)     Status: None   Collection Time    05/16/14  5:55 PM      Result Value Ref Range Status   Specimen Description BLOOD LEFT WRIST   Final   Special Requests BOTTLES DRAWN AEROBIC AND ANAEROBIC 5CC   Final   Culture  Setup Time     Final   Value: 05/16/2014 21:56     Performed at [facility]   Culture     Final   Value: NO GROWTH 5 DAYS     Performed at [facility]   Report Status 05/22/2014 FINAL   Final  MRSA PCR SCREENING     Status: None   Collection Time    05/17/14 10:48 AM      Result Value Ref Range Status   MRSA by PCR NEGATIVE  NEGATIVE Final   Comment:            The GeneXpert MRSA Assay (FDA     approved for NASAL specimens  only), is one component of a     comprehensive MRSA colonization     surveillance program. It is not     intended to diagnose MRSA     infection nor to guide or     monitor treatment for     MRSA infections.     Labs: Basic Metabolic Panel:  Recent Labs Lab 05/18/14 0330 05/20/14 1225 05/21/14 0400 05/22/14 0035 05/23/14 0228  NA 138 139 138 132* 133*  K 4.5 4.0 3.7 3.6* 3.4*  CL 96 96 95* 92* 92*  CO2 21 18* 19 19 19   GLUCOSE 210* 169* 170* 363* 235*  BUN 81* 51* 64* 69* 77*  CREATININE 6.30* 4.59* 5.50* 5.72* 6.81*  CALCIUM 9.5 9.7 9.7 9.4 9.4  MG 2.3  --   --   --   --    Liver Function Tests:  Recent Labs Lab 05/18/14 0330  AST 20  ALT 9  ALKPHOS 75  BILITOT 0.3  PROT 6.1  ALBUMIN 2.2*   No results found for this basename: LIPASE, AMYLASE,  in the last 168 hours No results found for this basename: AMMONIA,  in the last 168 hours CBC:  Recent Labs Lab 05/16/14 1601 05/17/14 0110 05/18/14 0330 05/21/14 0400 05/22/14 0035  WBC 22.0* 23.7* 19.5* 12.8* 12.2*  NEUTROABS 19.6*  --   --   --   --   HGB 11.2* 10.4* 9.8* 10.7* 10.2*  HCT  35.1* 32.4* 30.8* 33.3* 31.6*  MCV 96.4 93.6 94.8 93.8 91.6  PLT 288 270 281 282 220   Cardiac Enzymes:  Recent Labs Lab 05/16/14 1955 05/17/14 0110 05/17/14 0718  TROPONINI <0.30 <0.30 <0.30   BNP: BNP (last 3 results)  Recent Labs  04/21/14 0845 05/16/14 1955  PROBNP 5741.0* 21955.0*   CBG:  Recent Labs Lab 05/22/14 0837 05/22/14 1153 05/22/14 1702 05/22/14 2143 05/23/14 0818  GLUCAP 243* 210* 388* 234* 222*       Signed:  Velvet Bathe  [facility] 05/23/2014, 10:26 AM  Addendum: discussed with urologist about changing foley prior to patient being discharged. His instructions were to have patient come to the office (despite that I pointed out patient was going to residential hospice) to have the catheter removed at that time.  I will discuss with patient's family.

## 2014-05-23 NOTE — Progress Notes (Signed)
CSW spoke to Raymond [name], [place]. Bed offer in place for patient today. Met with patient and wife this morning. They accept bed offer and wish to go to [facility] today.  CSW paged Dr. Wendee Beavers to discuss and request DC summary ASAP as [facility] prefers early admissions to facility whenever possible.  CSW will  Complete d/c plan once d/c is confirmed and arrange EMS.  Lorie Phenix. Pauline Good, [place]

## 2014-05-23 NOTE — Progress Notes (Signed)
DC to [facility] completed this afternoon. Patient and wife were in full agreement to plan and were very happy that [facility] could accept patient. Nursing notified to call report. Support provided to to patient and wife. No further CSW needs identified. CSW signing off.  Lorie Phenix. Pauline Good, [place]

## 2014-05-23 NOTE — Progress Notes (Signed)
Pt d/c'd via [facility] transportation to [facility]. Pt d/c'd with belongings, belongings taken by wife and son. Information packet taken by [facility] transporters.

## 2014-05-23 NOTE — Progress Notes (Signed)
Foley catheter urinary drainage bag changed at pt/family and MD (Dr Wendee Beavers) request.

## 2014-05-24 NOTE — ED Provider Notes (Signed)
I saw and evaluated the patient, reviewed the resident's note and I agree with the findings and plan.   .Face to face Exam:  General:  Awake HEENT:  Atraumatic Resp:  Normal effort Abd:  Nondistended Neuro:No focal weakness Lymph: No adenopathy  Dot Lanes, MD 05/24/14 1200

## 2014-05-25 ENCOUNTER — Encounter: Payer: Managed Care, Other (non HMO) | Admitting: Physician Assistant

## 2014-05-26 ENCOUNTER — Encounter: Payer: Self-pay | Admitting: Endocrinology

## 2014-06-04 ENCOUNTER — Encounter: Payer: Managed Care, Other (non HMO) | Admitting: Physician Assistant

## 2014-06-09 DEATH — deceased

## 2014-06-29 ENCOUNTER — Ambulatory Visit: Payer: Managed Care, Other (non HMO) | Admitting: Endocrinology

## 2014-06-29 ENCOUNTER — Other Ambulatory Visit: Payer: Managed Care, Other (non HMO)

## 2014-08-26 IMAGING — CR DG CHEST 2V
2 series · 2 of 2 positions shown · non-contrast
Comparison: 04/24/2014

CLINICAL DATA: Shortness of breath, cough, congestion.

EXAM:
CHEST  2 VIEW

[w chest lat]
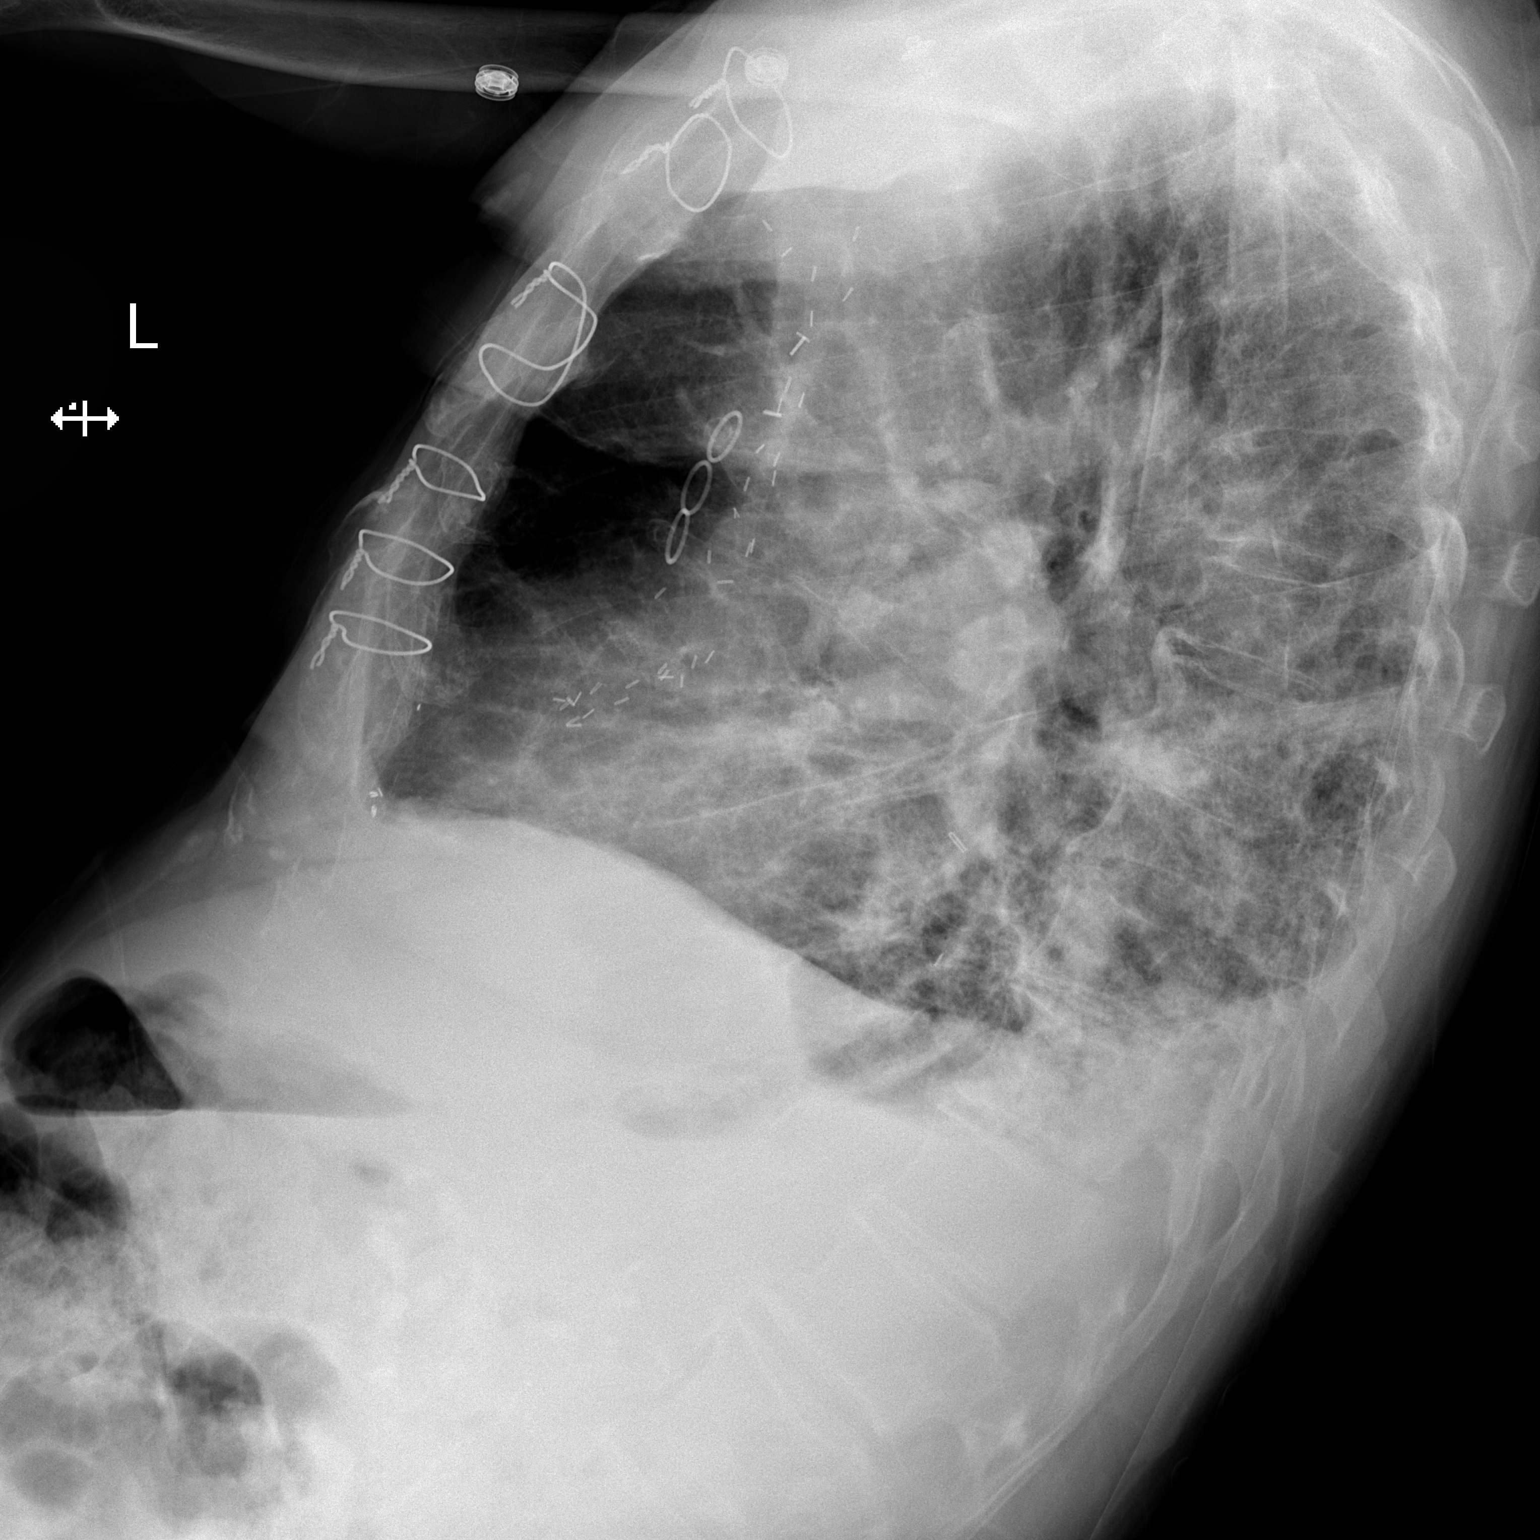

[x chest ap]
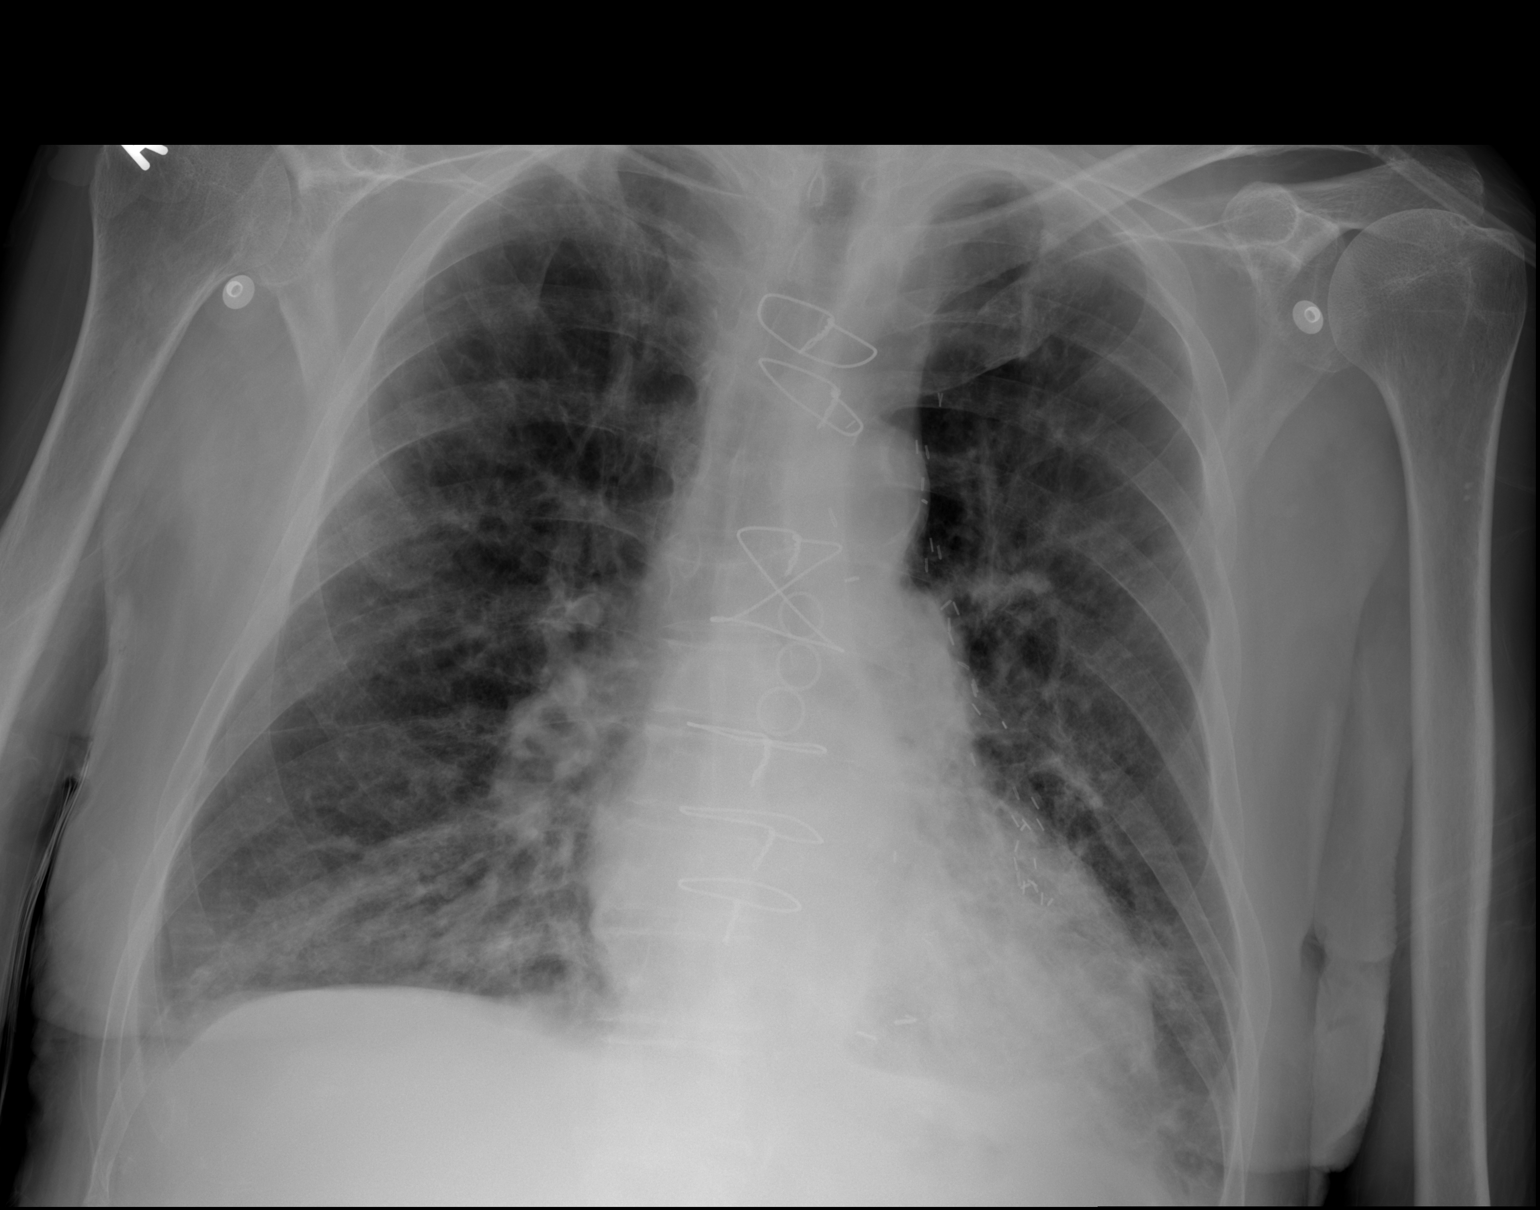

[2 of 2 positions shown; findings below may reference images not displayed]

FINDINGS: Prior CABG. Heart is upper limits normal size. Diffuse interstitial
prominence throughout the lungs, most pronounced in the lower lobes
where there are more confluent opacities. Small bilateral pleural
effusions.

No acute bony abnormality.
IMPRESSION: Increasing interstitial prominence and bibasilar opacities. Small
bilateral effusions. Findings could represent edema or infection.

## 2014-08-31 IMAGING — CR DG CHEST 1V PORT
1 series · 1 of 1 positions shown · non-contrast
Comparison: 05/18/2013

CLINICAL DATA: Congestive heart failure. The patient has now
undergone dialysis for volume removal.

EXAM:
PORTABLE CHEST - 1 VIEW

[AP]
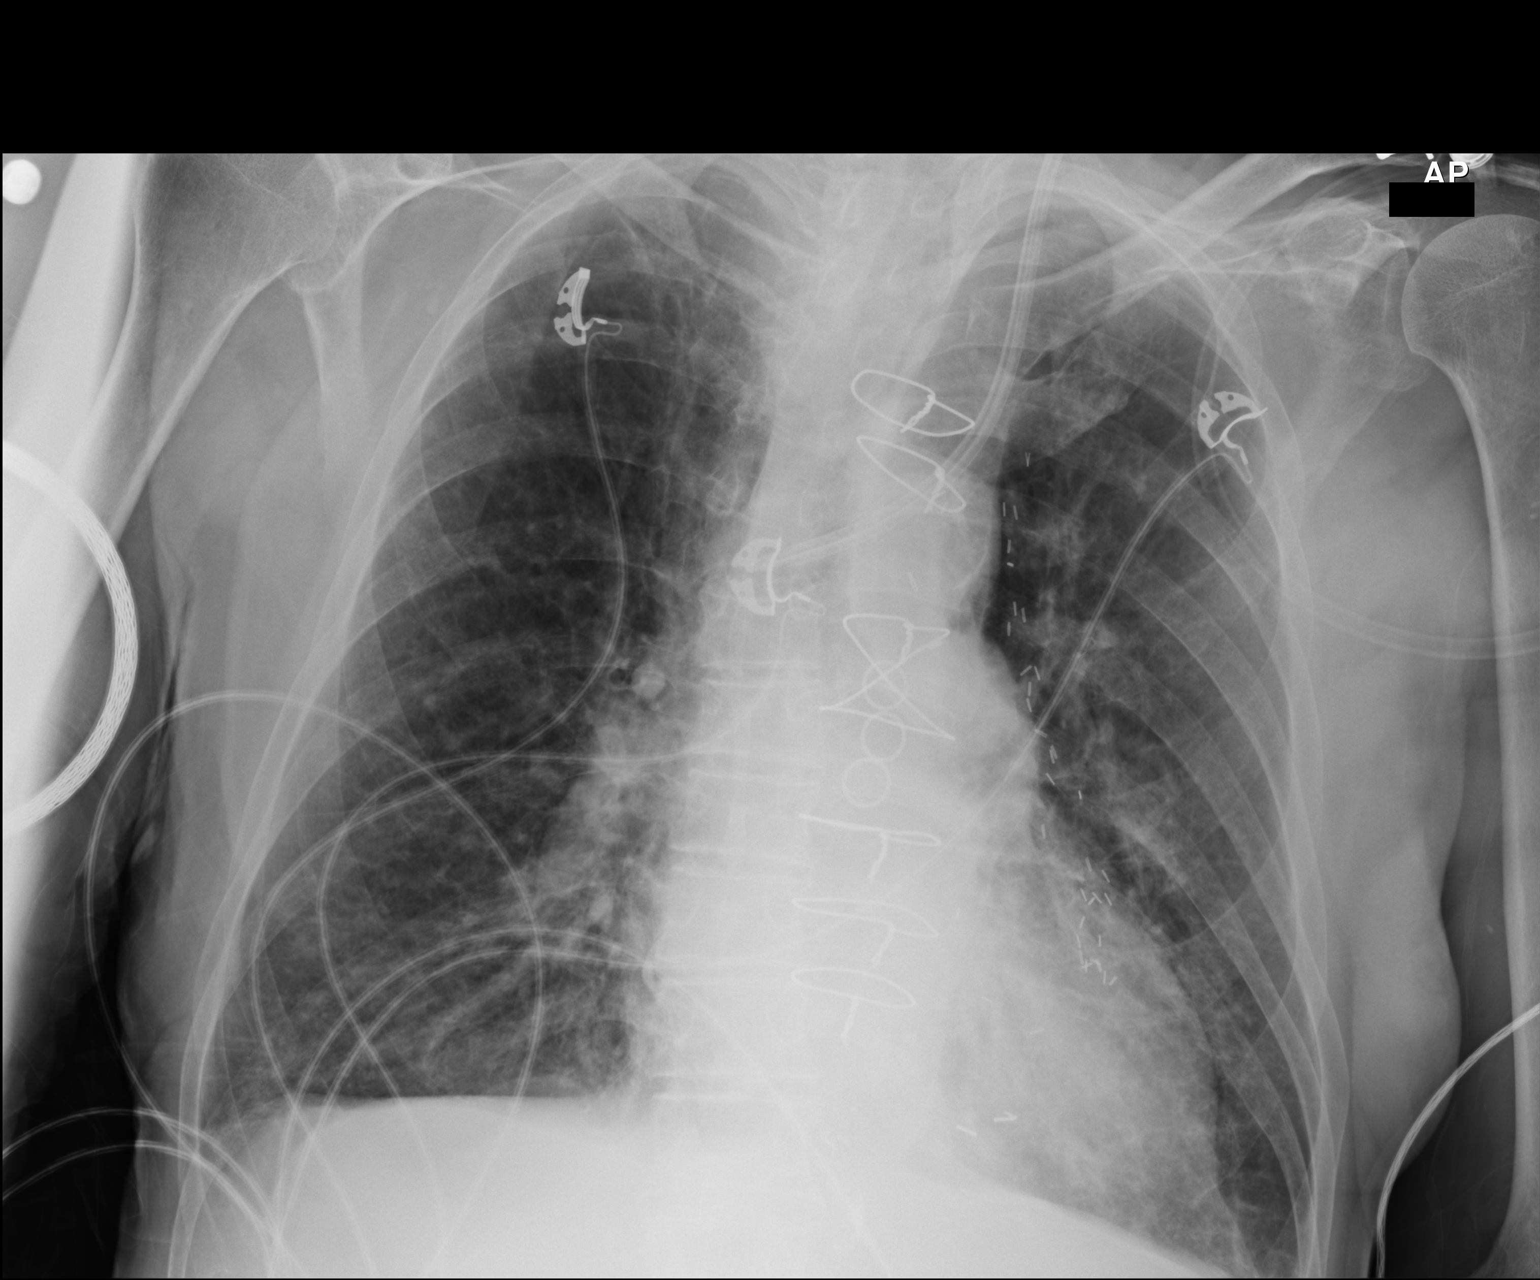

[1 of 1 positions shown; findings below may reference images not displayed]

FINDINGS: Double lumen catheter tip is at the superior vena cava at the level
of the azygos vein. Interstitial edema has markedly improved. There
still some Kerley B-lines at both lung bases. COPD. Heart size and
vascularity are normal.
IMPRESSION: Marked improvement in pulmonary edema with minimal interstitial
edema remaining at the bases.

## 2014-10-18 ENCOUNTER — Encounter (HOSPITAL_COMMUNITY): Payer: Self-pay | Admitting: Cardiology

## 2014-11-22 ENCOUNTER — Encounter (HOSPITAL_COMMUNITY): Payer: Self-pay | Admitting: Vascular Surgery

## 2015-05-06 ENCOUNTER — Other Ambulatory Visit: Payer: Self-pay
